# Patient Record
Sex: Female | Born: 1940
Health system: Western US, Academic
[De-identification: ages and names within clinical notes are randomized; demographics above are authoritative.]

## PROBLEM LIST (undated history)

## (undated) DIAGNOSIS — C801 Malignant (primary) neoplasm, unspecified: Secondary | ICD-10-CM

## (undated) DIAGNOSIS — K589 Irritable bowel syndrome without diarrhea: Secondary | ICD-10-CM

## (undated) DIAGNOSIS — I10 Essential (primary) hypertension: Secondary | ICD-10-CM

## (undated) DIAGNOSIS — G43909 Migraine, unspecified, not intractable, without status migrainosus: Secondary | ICD-10-CM

## (undated) HISTORY — PX: TONSILLECTOMY: SUR1361

## (undated) HISTORY — PX: OTHER SURGICAL HISTORY: SHX169

---

## 2007-12-05 ENCOUNTER — Emergency Department: Payer: Self-pay | Admitting: Emergency Medicine

## 2015-07-16 ENCOUNTER — Emergency Department
Admission: EM | Admit: 2015-07-16 | Discharge: 2015-07-16 | Discharge status: Against Medical Advice | Payer: Medicare Other | Attending: Emergency Medicine | Admitting: Emergency Medicine

## 2015-07-16 ENCOUNTER — Encounter: Payer: Self-pay | Admitting: *Deleted

## 2015-07-16 DIAGNOSIS — R51 Headache: Secondary | ICD-10-CM | POA: Diagnosis not present

## 2015-07-16 DIAGNOSIS — R110 Nausea: Secondary | ICD-10-CM | POA: Diagnosis not present

## 2015-07-16 DIAGNOSIS — I10 Essential (primary) hypertension: Secondary | ICD-10-CM | POA: Diagnosis present

## 2015-07-16 HISTORY — DX: Irritable bowel syndrome, unspecified: K58.9

## 2015-07-16 HISTORY — DX: Essential (primary) hypertension: I10

## 2015-07-16 NOTE — ED Notes (Signed)
Took ativan 1 mg prior to arrival.

## 2015-07-16 NOTE — ED Notes (Signed)
Pt to stat registration; says she's feeling much better and just wants to go home; slight headache, rates 1.5/10; denies nausea;

## 2015-07-16 NOTE — ED Notes (Signed)
Pt reports hypertension throughout the afternoon, as high as 188/125 at 2020. On arrival to ED 149/70. Pt reports headache, nausea, tingling, hx of anxiety.

## 2015-09-15 ENCOUNTER — Emergency Department: Payer: Medicare Other

## 2015-09-15 ENCOUNTER — Emergency Department
Admission: EM | Admit: 2015-09-15 | Discharge: 2015-09-15 | Disposition: A | Discharge status: Home / Self Care | Payer: Medicare Other | Attending: Emergency Medicine | Admitting: Emergency Medicine

## 2015-09-15 DIAGNOSIS — R1031 Right lower quadrant pain: Secondary | ICD-10-CM | POA: Insufficient documentation

## 2015-09-15 DIAGNOSIS — I10 Essential (primary) hypertension: Secondary | ICD-10-CM | POA: Insufficient documentation

## 2015-09-15 DIAGNOSIS — Z87891 Personal history of nicotine dependence: Secondary | ICD-10-CM | POA: Diagnosis not present

## 2015-09-15 DIAGNOSIS — R1011 Right upper quadrant pain: Secondary | ICD-10-CM | POA: Diagnosis not present

## 2015-09-15 DIAGNOSIS — R1084 Generalized abdominal pain: Secondary | ICD-10-CM | POA: Diagnosis present

## 2015-09-15 DIAGNOSIS — R112 Nausea with vomiting, unspecified: Secondary | ICD-10-CM | POA: Insufficient documentation

## 2015-09-15 DIAGNOSIS — R109 Unspecified abdominal pain: Secondary | ICD-10-CM

## 2015-09-15 HISTORY — DX: Malignant (primary) neoplasm, unspecified: C80.1

## 2015-09-15 HISTORY — DX: Migraine, unspecified, not intractable, without status migrainosus: G43.909

## 2015-09-15 LAB — CBC
HCT: 43.7 % (ref 35.0–47.0)
Hemoglobin: 14.4 g/dL (ref 12.0–16.0)
MCH: 30.5 pg (ref 26.0–34.0)
MCHC: 33 g/dL (ref 32.0–36.0)
MCV: 92.2 fL (ref 80.0–100.0)
PLATELETS: 187 10*3/uL (ref 150–440)
RBC: 4.74 MIL/uL (ref 3.80–5.20)
RDW: 14.5 % (ref 11.5–14.5)
WBC: 12.9 10*3/uL — ABNORMAL HIGH (ref 3.6–11.0)

## 2015-09-15 LAB — URINALYSIS COMPLETE WITH MICROSCOPIC (ARMC ONLY)
Bilirubin Urine: NEGATIVE
GLUCOSE, UA: NEGATIVE mg/dL
HGB URINE DIPSTICK: NEGATIVE
Ketones, ur: NEGATIVE mg/dL
Leukocytes, UA: NEGATIVE
NITRITE: NEGATIVE
PH: 8 (ref 5.0–8.0)
Protein, ur: NEGATIVE mg/dL
Specific Gravity, Urine: 1.027 (ref 1.005–1.030)

## 2015-09-15 LAB — BASIC METABOLIC PANEL
Anion gap: 10 (ref 5–15)
BUN: 20 mg/dL (ref 6–20)
CHLORIDE: 102 mmol/L (ref 101–111)
CO2: 28 mmol/L (ref 22–32)
CREATININE: 1.02 mg/dL — AB (ref 0.44–1.00)
Calcium: 9.7 mg/dL (ref 8.9–10.3)
GFR calc non Af Amer: 52 mL/min — ABNORMAL LOW (ref >60)
GLUCOSE: 144 mg/dL — AB (ref 65–99)
Potassium: 4.6 mmol/L (ref 3.5–5.1)
Sodium: 140 mmol/L (ref 135–145)

## 2015-09-15 LAB — LIPASE, BLOOD: Lipase: 19 U/L (ref 11–51)

## 2015-09-15 LAB — TROPONIN I: Troponin I: <0.03 ng/mL (ref <0.031)

## 2015-09-15 MED ORDER — IOHEXOL 350 MG/ML SOLN
100.0000 mL | Freq: Once | INTRAVENOUS | Status: AC | PRN
  Administered 2015-09-15: 85 mL via INTRAVENOUS

## 2015-09-15 MED ORDER — SODIUM CHLORIDE 0.9 % IV BOLUS (SEPSIS)
1000.0000 mL | Freq: Once | INTRAVENOUS | Status: AC
  Administered 2015-09-15: 1000 mL via INTRAVENOUS

## 2015-09-15 MED ORDER — ONDANSETRON 4 MG PO TBDP: 4.0000 mg | ORAL_TABLET | Freq: Three times a day (TID) | ORAL | Status: AC | PRN

## 2015-09-15 MED ORDER — IOHEXOL 240 MG/ML SOLN
25.0000 mL | Freq: Once | INTRAMUSCULAR | Status: AC | PRN
  Administered 2015-09-15: 25 mL via ORAL

## 2015-09-15 MED ORDER — MORPHINE SULFATE (PF) 2 MG/ML IV SOLN
2.0000 mg | Freq: Once | INTRAVENOUS | Status: AC
  Administered 2015-09-15: 2 mg via INTRAVENOUS
  Filled 2015-09-15: qty 1

## 2015-09-15 MED ORDER — ONDANSETRON HCL 4 MG/2ML IJ SOLN
4.0000 mg | Freq: Once | INTRAMUSCULAR | Status: AC
  Administered 2015-09-15: 4 mg via INTRAVENOUS
  Filled 2015-09-15: qty 2

## 2015-09-15 NOTE — ED Provider Notes (Signed)
[facility] Emergency Department Provider Note  ____________________________________________  Time seen: 4:40 AM  I have reviewed the triage vital signs and the nursing notes.   HISTORY  Chief Complaint Abdominal Pain and Nausea     HPI Savannah Strong is a 74 y.o. female presents via EMS with 7 out of 10 generalized abdominal pain and nausea with accompanying high blood pressure. Patient denies any fever, no diarrhea or vomiting     Past Medical History  Diagnosis Date  . Hypertension   . IBS (irritable bowel syndrome)   . Migraines     There are no active problems to display for this patient.   Past Surgical History  Procedure Laterality Date  . Masectomy    . Tonsillectomy      No current outpatient prescriptions on file.  Allergies Sulfa antibiotics  No family history on file.  Social History Social History  Substance Use Topics  . Smoking status: Former Research scientist (life sciences)  . Smokeless tobacco: None  . Alcohol Use: Yes    Review of Systems  Constitutional: Negative for fever. Eyes: Negative for visual changes. ENT: Negative for sore throat. Cardiovascular: Negative for chest pain. Respiratory: Negative for shortness of breath. Gastrointestinal: Positive for abdominal pain, vomiting. Genitourinary: Negative for dysuria. Musculoskeletal: Negative for back pain. Skin: Negative for rash. Neurological: Negative for headaches, focal weakness or numbness.  10-point ROS otherwise negative.  ____________________________________________   PHYSICAL EXAM:  VITAL SIGNS: ED Triage Vitals  Enc Vitals Group     BP 09/15/15 0437 177/86 mmHg     Pulse Rate 09/15/15 0437 67     Resp 09/15/15 0437 12     Temp 09/15/15 0437 97.6 F (36.4 C)     Temp Source 09/15/15 0437 Oral     SpO2 09/15/15 0437 99 %     Weight 09/15/15 0437 180 lb (81.647 kg)     Height 09/15/15 0437 5\' 8"  (1.727 m)     Head Cir --      Peak Flow --      Pain Score  09/15/15 0439 5     Pain Loc --      Pain Edu? --      Excl. in [place]? --    Constitutional: Alert and oriented. Well appearing and in no distress. Eyes: Conjunctivae are normal. PERRL. Normal extraocular movements. ENT   Head: Normocephalic and atraumatic.   Nose: No congestion/rhinnorhea.   Mouth/Throat: Mucous membranes are moist.   Neck: No stridor. Hematological/Lymphatic/Immunilogical: No cervical lymphadenopathy. Cardiovascular: Normal rate, regular rhythm. Normal and symmetric distal pulses are present in all extremities. No murmurs, rubs, or gallops. Respiratory: Normal respiratory effort without tachypnea nor retractions. Breath sounds are clear and equal bilaterally. No wheezes/rales/rhonchi. Gastrointestinal: RUQ/RLQ pain. No distention. There is no CVA tenderness. Genitourinary: deferred Musculoskeletal: Nontender with normal range of motion in all extremities. No joint effusions.  No lower extremity tenderness nor edema. Neurologic:  Normal speech and language. No gross focal neurologic deficits are appreciated. Speech is normal.  Skin:  Skin is warm, dry and intact. No rash noted. Psychiatric: Mood and affect are normal. Speech and behavior are normal. Patient exhibits appropriate insight and judgment.  ____________________________________________    LABS (pertinent positives/negatives)  Labs Reviewed  BASIC METABOLIC PANEL - Abnormal; Notable for the following:    Glucose, Bld 144 (*)    Creatinine, Ser 1.02 (*)    GFR calc non Af Amer 52 (*)    All other components within normal  limits  CBC - Abnormal; Notable for the following:    WBC 12.9 (*)    All other components within normal limits  TROPONIN I  LIPASE, BLOOD         RADIOLOGY      US Abdomen Limited RUQ (Final result) Result time: 09/15/15 06:27:44   Procedure changed from US Abdomen Limited      Final result by Rad Results In Interface (09/15/15 06:27:44)   Narrative:    CLINICAL DATA: Acute onset of right upper quadrant abdominal pain, nausea and vomiting. Initial encounter.  EXAM: US ABDOMEN LIMITED - RIGHT UPPER QUADRANT  COMPARISON: None.  FINDINGS: Gallbladder:  No gallstones or wall thickening visualized. No sonographic Murphy sign noted.  Common bile duct:  Diameter: 0.3 cm, within normal limits in caliber.  Liver:  A simple 2.2 x 1.8 x 2.0 cm cyst is noted near the porta hepatis. Trace ascites is seen about the dome of the liver. Within normal limits in parenchymal echogenicity.  IMPRESSION: 1. No acute abnormality within the right upper quadrant. 2. Small hepatic cyst noted. 3. Trace ascites about the dome of the liver.   Electronically Signed By: Garald Balding M.D. On: 09/15/2015 06:27       CT Abdomen Pelvis W Contrast (Final result) Result time: 09/15/15 08:23:54   Final result by Rad Results In Interface (09/15/15 08:23:54)   Narrative:   CLINICAL DATA: Abdominal pain, nausea, and vomiting.  EXAM: CT ABDOMEN AND PELVIS WITH CONTRAST  TECHNIQUE: Multidetector CT imaging of the abdomen and pelvis was performed using the standard protocol following bolus administration of intravenous contrast.  CONTRAST: 73mL OMNIPAQUE IOHEXOL 350 MG/ML SOLN  COMPARISON: Ultrasound exam dated 09/15/2015  FINDINGS: Lower chest: 8 mm calcified granuloma at the right lung base laterally. Otherwise, normal.  Hepatobiliary: Simple 2.5 cm cyst in the right lobe of the liver in adjacent to the gallbladder. Otherwise normal. Biliary tree is normal.  Pancreas: Normal.  Spleen: Multiple calcified granulomas in the spleen. Small accessory spleen. Otherwise normal.  Adrenals/Urinary Tract: Normal.  Stomach/Bowel: Scattered diverticula in the distal colon. Otherwise normal including the terminal ileum and appendix.  Vascular/Lymphatic: No adenopathy. Minimal calcifications in the aorta and iliac  arteries.  Reproductive: Normal.  Other: No mass lesions. Tiny amount of free fluid in the pelvis and right pericolic gutter. This is nonspecific.  Musculoskeletal: No acute abnormalities. Severe degenerative disc disease at L3-4.  IMPRESSION: Benign appearing abdomen.   Electronically Signed By: Lorriane Shire M.D. On: 09/15/2015 08:23       INITIAL IMPRESSION / ASSESSMENT AND PLAN / ED COURSE  Pertinent labs & imaging results that were available during my care of the patient were reviewed by me and considered in my medical decision making (see chart for details).  Patient's care transferred to Dr. Edd Fabian at 7:30 AM   ____________________________________________   FINAL CLINICAL IMPRESSION(S) / ED DIAGNOSES  Final diagnoses:  Abdominal pain, unspecified abdominal location      Gregor Hams, MD 09/16/15 [phone]

## 2015-09-15 NOTE — ED Notes (Signed)
Patient transported to CT 

## 2015-09-15 NOTE — ED Notes (Signed)
Pt presents to ED via EMS from [facility] c/o abdominal pain, nausea. Pt has high blood pressure. Pt states she normally gets a headache when her BP is so high, but states no headache this time.

## 2015-09-15 NOTE — ED Notes (Signed)
Pt transported to US via stretcher.  

## 2015-09-15 NOTE — ED Notes (Signed)
Pt states pain in upper abdomen feels like a "slight ache." States "it hurts to breath."

## 2015-09-15 NOTE — ED Notes (Signed)
Resumed care from [place]. Pt resting and awaiting CT scan.

## 2015-09-15 NOTE — ED Notes (Signed)
Pt is done drinking contrast.

## 2015-09-15 NOTE — ED Notes (Signed)
Pt ambulated to bathroom with steady gait. 

## 2015-09-15 NOTE — ED Provider Notes (Signed)
-----------------------------------------   8:56 AM on 09/15/2015 -----------------------------------------  Care was assumed from Dr. Owens Shark at 0700 pending results of CT scan which are benign. At this time, the patient is sitting up in bed, reports that she feels well, she is tolerating by mouth intake. She reports that her symptoms today feel similar to when she suffered from gastritis and this may be a cause of her complaints today. She is requesting discharge at this time. Urinalysis is sent and is pending but I discussed with the patient and her husband that I would call them if there were abnormal results that required intervention. We'll discharge with return precautions and close PCP follow-up. The patient and her husband are comfortable with the discharge plan.  Joanne Gavel, MD 09/15/15 [phone]

## 2017-05-08 ENCOUNTER — Other Ambulatory Visit: Payer: Self-pay | Admitting: Otolaryngology

## 2017-05-08 DIAGNOSIS — R221 Localized swelling, mass and lump, neck: Secondary | ICD-10-CM

## 2017-05-08 DIAGNOSIS — R2231 Localized swelling, mass and lump, right upper limb: Secondary | ICD-10-CM

## 2017-05-14 ENCOUNTER — Ambulatory Visit
Admission: RE | Admit: 2017-05-14 | Discharge: 2017-05-14 | Disposition: A | Discharge status: Home / Self Care | Payer: Medicare Other | Source: Ambulatory Visit | Attending: Otolaryngology | Admitting: Otolaryngology

## 2017-05-14 DIAGNOSIS — R221 Localized swelling, mass and lump, neck: Secondary | ICD-10-CM | POA: Insufficient documentation

## 2017-05-14 DIAGNOSIS — R2231 Localized swelling, mass and lump, right upper limb: Secondary | ICD-10-CM | POA: Insufficient documentation

## 2018-01-30 ENCOUNTER — Other Ambulatory Visit: Payer: Self-pay | Admitting: Otolaryngology

## 2018-01-30 DIAGNOSIS — R1310 Dysphagia, unspecified: Secondary | ICD-10-CM

## 2018-02-17 ENCOUNTER — Ambulatory Visit
Admission: RE | Admit: 2018-02-17 | Discharge: 2018-02-17 | Disposition: A | Discharge status: Home / Self Care | Payer: Medicare Other | Source: Ambulatory Visit | Attending: Otolaryngology | Admitting: Otolaryngology

## 2018-02-17 DIAGNOSIS — R1310 Dysphagia, unspecified: Secondary | ICD-10-CM | POA: Diagnosis present

## 2018-02-17 DIAGNOSIS — R1314 Dysphagia, pharyngoesophageal phase: Secondary | ICD-10-CM

## 2018-02-17 NOTE — Therapy (Addendum)
[place], [place], [address] Phone: [phone]   Fax:     Modified Barium Swallow  Patient Details  Name: Savannah Strong MRN: [id number] Date of Birth: 1940/11/05 No data recorded  Encounter Date: 02/17/2018  End of Session - 02/17/18 2134    Visit Number  1    Number of Visits  1    Date for SLP Re-Evaluation  02/17/18    SLP Start Time  6    SLP Stop Time   1400    SLP Time Calculation (min)  60 min    Activity Tolerance  Patient tolerated treatment well       Past Medical History:  Diagnosis Date  . Cancer ([place])    Left breast with mast  . Hypertension   . IBS (irritable bowel syndrome)   . Migraines     Past Surgical History:  Procedure Laterality Date  . masectomy    . TONSILLECTOMY      There were no vitals filed for this visit.       Subjective: Patient behavior: (alertness, ability to follow instructions, etc.): pt pleasant, conversive. Natural dentition.  Chief complaint: dysphagia; Report of globus feelings to ENT, phlegm "hanging" feeling. She has not been on a PPI per report.   Objective:  Radiological Procedure: A videoflouroscopic evaluation of oral-preparatory, reflex initiation, and pharyngeal phases of the swallow was performed; as well as a screening of the upper esophageal phase.  I. POSTURE: upright II. VIEW: lateral III. COMPENSATORY STRATEGIES: f/u, dry swallow as needed IV. BOLUSES ADMINISTERED:  Thin Liquid: 5 trials  Nectar-thick Liquid: 1 trial  Honey-thick Liquid: NT  Puree: 3 trials  Mechanical Soft: 2 trials V. RESULTS OF EVALUATION: A. ORAL PREPARATORY PHASE: (The lips, tongue, and velum are observed for strength and coordination)       **Overall Severity Rating: WFL. Timely bolus management for mastication and A-P transfer; adequate oral clearing post swallowing.  B. SWALLOW INITIATION/REFLEX: (The reflex is normal if "triggered" by the  time the bolus reached the base of the tongue)  **Overall Severity Rating: [facility]. Timing of the pharyngeal swallow was appropriate w/ all bolus consistencies. Adequate airway protection, closure during swallowing.  C. PHARYNGEAL PHASE: (Pharyngeal function is normal if the bolus shows rapid, smooth, and continuous transit through the pharynx and there is no pharyngeal residue after the swallow)  **Overall Severity Rating: [facility] No significant pharyngeal residue noted post bolus swallowing w/ any consistency - any slight amount was cleared w/ a f/u, dry swallow by pt (independently).  D. LARYNGEAL PENETRATION: (Material entering into the laryngeal inlet/vestibule but not aspirated): None E. ASPIRATION: None F. ESOPHAGEAL PHASE: (Screening of the upper esophagus): no overt dysmotility noted in the Cervical Esophagus viewable  ASSESSMENT: Pt appeared to present w/ adequate oropharyngeal phase swallow function w/ no obvious oropharyngeal phase dysphagia during this exam. During the Esophageal phase, no overt dysmotility noted in the Cervical Esophagus viewable - no impedence noted in the Cervical Esophagus. During the pharyngeal phase, no significant pharyngeal residue noted post bolus swallowing w/ any consistency - any slight amount was cleared w/ a f/u, dry swallow by pt (independently). Laryngeal excursion and pharyngeal pressure during the swallow appeared appropriate and adequate. Timing of the pharyngeal swallow was [facility] for pt's age and appropriate w/ all bolus consistencies. Adequate airway protection, closure during swallowing. During the oral phase, timely bolus management for mastication and A-P transfer; adequate oral clearing post swallowing.  Recommend f/u w/ GI for further Esophageal assessment d/t pt's description of s/s of Reflux. Education discussed w/ pt.   PLAN/RECOMMENDATIONS:  A. Diet: Regular; thin liquids(recommend no ice if cold is uncomfortable). Pills in Puree if needed for easier  swallowing, clearing of the Esophagus  B. Swallowing Precautions: general aspiration precautions; recommend REFLUX precautions  C. Recommended consultation to: GI for further Esophageal phase assessment d/t describing s/s of REFLUX; management/education  D. Therapy recommendations: None.  E. Results and recommendations were discussed w/ pt; video viewed and questions answered          Dysphagia, pharyngoesophageal phase  Dysphagia, unspecified type - Plan: DG OP Swallowing Func-Medicare/Speech Path, DG OP Swallowing Func-Medicare/Speech Path        Problem List There are no active problems to display for this patient.     Orinda Kenner, [place], CCC-SLP [place] 02/17/2018, 9:35 PM  [facility] [place], [place], [address] Phone: [phone]   Fax:     Name: ADLER ALTON MRN: [id number] Date of Birth: Apr 06, 1941

## 2018-02-18 ENCOUNTER — Ambulatory Visit: Payer: Medicare Other

## 2019-03-03 ENCOUNTER — Emergency Department: Payer: Medicare Other

## 2019-03-03 ENCOUNTER — Emergency Department
Admission: EM | Admit: 2019-03-03 | Discharge: 2019-03-03 | Disposition: A | Discharge status: Home / Self Care | Payer: Medicare Other | Attending: Emergency Medicine | Admitting: Emergency Medicine

## 2019-03-03 ENCOUNTER — Other Ambulatory Visit: Payer: Self-pay

## 2019-03-03 DIAGNOSIS — I10 Essential (primary) hypertension: Secondary | ICD-10-CM | POA: Diagnosis not present

## 2019-03-03 DIAGNOSIS — Y9301 Activity, walking, marching and hiking: Secondary | ICD-10-CM | POA: Insufficient documentation

## 2019-03-03 DIAGNOSIS — Y999 Unspecified external cause status: Secondary | ICD-10-CM | POA: Insufficient documentation

## 2019-03-03 DIAGNOSIS — S6991XA Unspecified injury of right wrist, hand and finger(s), initial encounter: Secondary | ICD-10-CM

## 2019-03-03 DIAGNOSIS — Z79899 Other long term (current) drug therapy: Secondary | ICD-10-CM | POA: Diagnosis not present

## 2019-03-03 DIAGNOSIS — S0181XA Laceration without foreign body of other part of head, initial encounter: Secondary | ICD-10-CM | POA: Insufficient documentation

## 2019-03-03 DIAGNOSIS — S60511A Abrasion of right hand, initial encounter: Secondary | ICD-10-CM | POA: Insufficient documentation

## 2019-03-03 DIAGNOSIS — Z87891 Personal history of nicotine dependence: Secondary | ICD-10-CM | POA: Diagnosis not present

## 2019-03-03 DIAGNOSIS — S0990XA Unspecified injury of head, initial encounter: Secondary | ICD-10-CM | POA: Diagnosis present

## 2019-03-03 DIAGNOSIS — Z23 Encounter for immunization: Secondary | ICD-10-CM | POA: Insufficient documentation

## 2019-03-03 DIAGNOSIS — W101XXA Fall (on)(from) sidewalk curb, initial encounter: Secondary | ICD-10-CM | POA: Diagnosis not present

## 2019-03-03 DIAGNOSIS — Y929 Unspecified place or not applicable: Secondary | ICD-10-CM | POA: Diagnosis not present

## 2019-03-03 DIAGNOSIS — W19XXXA Unspecified fall, initial encounter: Secondary | ICD-10-CM

## 2019-03-03 MED ORDER — CEPHALEXIN 500 MG PO CAPS: 500.0000 mg | ORAL_CAPSULE | Freq: Four times a day (QID) | ORAL | 0 refills | Status: AC

## 2019-03-03 MED ORDER — BACITRACIN-NEOMYCIN-POLYMYXIN 400-5-5000 EX OINT
TOPICAL_OINTMENT | Freq: Once | CUTANEOUS | Status: AC
  Administered 2019-03-03: 16:00:00 via TOPICAL
  Filled 2019-03-03: qty 1

## 2019-03-03 MED ORDER — TETANUS-DIPHTH-ACELL PERTUSSIS 5-2.5-18.5 LF-MCG/0.5 IM SUSP
0.5000 mL | Freq: Once | INTRAMUSCULAR | Status: AC
  Administered 2019-03-03: 0.5 mL via INTRAMUSCULAR
  Filled 2019-03-03: qty 0.5

## 2019-03-03 MED ORDER — LIDOCAINE HCL (PF) 1 % IJ SOLN
5.0000 mL | Freq: Once | INTRAMUSCULAR | Status: AC
  Administered 2019-03-03: 5 mL via INTRADERMAL
  Filled 2019-03-03: qty 5

## 2019-03-03 NOTE — ED Triage Notes (Signed)
Pt states she was walking in the neighborhood today and tripped and fell on the sideway. Pt has a laceration to the right bow and abrasions to BL hands. Denies LOC. Pt denies any other injures.

## 2019-03-03 NOTE — ED Provider Notes (Signed)
[facility] Emergency Department Provider Note  ____________________________________________  Time seen: Approximately 1:15 PM  I have reviewed the triage vital signs and the nursing notes.   HISTORY  Chief Complaint Fall    HPI Savannah Strong is a 78 y.o. female that presents to the emergency department for evaluation after fall.  Patient was walking with a friend when she tripped on a curb and fell.  She hit her head on the sidewalk.  She reached out both hands to brace her fall and has abrasions to bilateral palms.  She has some bruising starting on her right hand.  No loss of consciousness.  She does not take any blood thinners.  She has been walking since fall.  She is unsure of last tetanus shot.   Past Medical History:  Diagnosis Date  . Cancer ([place])    Left breast with mast  . Hypertension   . IBS (irritable bowel syndrome)   . Migraines     There are no active problems to display for this patient.   Past Surgical History:  Procedure Laterality Date  . masectomy    . TONSILLECTOMY      Prior to Admission medications   Medication Sig Start Date End Date Taking? Authorizing Provider  butalbital-acetaminophen-caffeine (FIORICET) 50-325-40 MG tablet Take 1 tablet by mouth 2 (two) times daily as needed for headache.   Yes [provider]  escitalopram (LEXAPRO) 10 MG tablet Take 10 mg by mouth daily.   Yes [provider]  fluticasone furoate-vilanterol (BREO ELLIPTA) 200-25 MCG/INH AEPB Inhale 1 puff into the lungs daily.   Yes [provider]  loratadine (CLARITIN) 10 MG tablet Take 10 mg by mouth daily.   Yes [provider]  cephALEXin (KEFLEX) 500 MG capsule Take 1 capsule (500 mg total) by mouth 4 (four) times daily for 10 days. 03/03/19 03/13/19  Laban Emperor, PA-C  ondansetron (ZOFRAN ODT) 4 MG disintegrating tablet Take 1 tablet (4 mg total) by mouth every 8 (eight) hours as needed for nausea or  vomiting. 09/15/15   Joanne Gavel, MD    Allergies Sulfa antibiotics  No family history on file.  Social History Social History   Tobacco Use  . Smoking status: Former Research scientist (life sciences)  . Smokeless tobacco: Never Used  Substance Use Topics  . Alcohol use: Yes  . Drug use: No     Review of Systems  Cardiovascular: No chest pain. Respiratory: No SOB. Gastrointestinal: No abdominal pain.  No nausea, no vomiting.  Musculoskeletal: Positive for hand pain. Skin: Negative for rash, abrasions, lacerations.  Positive for ecchymosis. Neurological: Negative for headaches   ____________________________________________   PHYSICAL EXAM:  VITAL SIGNS: ED Triage Vitals [03/03/19 1247]  Enc Vitals Group     BP (!) 151/76     Pulse Rate (!) 58     Resp 17     Temp 98.2 F (36.8 C)     Temp Source Oral     SpO2 98 %     Weight 184 lb (83.5 kg)     Height 5\' 8"  (1.727 m)     Head Circumference      Peak Flow      Pain Score 1     Pain Loc      Pain Edu?      Excl. in [place]?      Constitutional: Alert and oriented. Well appearing and in no acute distress. Eyes: Conjunctivae are normal. PERRL. EOMI. Head: 2 cm  U shapped laceration with jagged edges to right temple. ENT:      Ears:      Nose: No congestion/rhinnorhea.      Mouth/Throat: Mucous membranes are moist.  Neck: No stridor.  No cervical spine tenderness to palpation. Cardiovascular: Normal rate, regular rhythm.  Good peripheral circulation. Respiratory: Normal respiratory effort without tachypnea or retractions. Lungs CTAB. Good air entry to the bases with no decreased or absent breath sounds. Musculoskeletal: Full range of motion to all extremities. No gross deformities appreciated.  Weightbearing.  Normal gait. Neurologic:  Normal speech and language. No gross focal neurologic deficits are appreciated.  Skin:  Skin is warm, dry.  Abrasions to bilateral palms.  Ecchymosis starting to right palm and right lateral dorsal  hand. Psychiatric: Mood and affect are normal. Speech and behavior are normal. Patient exhibits appropriate insight and judgement.   ____________________________________________   LABS (all labs ordered are listed, but only abnormal results are displayed)  Labs Reviewed - No data to display ____________________________________________  EKG   ____________________________________________  RADIOLOGY  Ct Head Wo Contrast  Result Date: 03/03/2019 CLINICAL DATA:  Tripped and fell on the sidewalk. EXAM: CT HEAD WITHOUT CONTRAST CT MAXILLOFACIAL WITHOUT CONTRAST CT CERVICAL SPINE WITHOUT CONTRAST TECHNIQUE: Multidetector CT imaging of the head, cervical spine, and maxillofacial structures were performed using the standard protocol without intravenous contrast. Multiplanar CT image reconstructions of the cervical spine and maxillofacial structures were also generated. COMPARISON:  CT head dated December 05, 2007. FINDINGS: CT HEAD FINDINGS Brain: No evidence of acute infarction, hemorrhage, hydrocephalus, extra-axial collection or mass lesion/mass effect. Mildly progressive generalized cerebral atrophy. Vascular: No hyperdense vessel or unexpected calcification. Skull: Normal. Negative for fracture or focal lesion. Other: Tiny superficial laceration above the right orbit. CT MAXILLOFACIAL FINDINGS Osseous: No fracture or mandibular dislocation. Slight lateral subluxation of the right mandibular condyle with respect to the temporal fossa. No destructive process. Orbits: Negative. No traumatic or inflammatory finding. Sinuses: Clear. Soft tissues: Negative. CT CERVICAL SPINE FINDINGS Alignment: Slight reversal of the normal cervical lordosis. No traumatic malalignment. Trace facet mediated anterolisthesis at C3-C4. Skull base and vertebrae: No acute fracture. No primary bone lesion or focal pathologic process. Soft tissues and spinal canal: No prevertebral fluid or swelling. No visible canal hematoma. Disc  levels: Moderate disc height loss and uncovertebral hypertrophy from C4-C5 through C6-C7. Severe facet arthropathy on the left at C2-C3 and C3-C4 with ankylosis of the left C2-C3 facet joint. Moderate right-sided facet arthropathy at C3-C4. Upper chest: Biapical pleuroparenchymal scarring, worse on the right. Other: None. IMPRESSION: 1.  No acute intracranial abnormality. 2. No acute maxillofacial fracture. Tiny superficial laceration above the right orbit. 3.  No acute cervical spine fracture. Electronically Signed   By: Titus Dubin M.D.   On: 03/03/2019 13:48   Ct Cervical Spine Wo Contrast  Result Date: 03/03/2019 CLINICAL DATA:  Tripped and fell on the sidewalk. EXAM: CT HEAD WITHOUT CONTRAST CT MAXILLOFACIAL WITHOUT CONTRAST CT CERVICAL SPINE WITHOUT CONTRAST TECHNIQUE: Multidetector CT imaging of the head, cervical spine, and maxillofacial structures were performed using the standard protocol without intravenous contrast. Multiplanar CT image reconstructions of the cervical spine and maxillofacial structures were also generated. COMPARISON:  CT head dated December 05, 2007. FINDINGS: CT HEAD FINDINGS Brain: No evidence of acute infarction, hemorrhage, hydrocephalus, extra-axial collection or mass lesion/mass effect. Mildly progressive generalized cerebral atrophy. Vascular: No hyperdense vessel or unexpected calcification. Skull: Normal. Negative for fracture or focal lesion. Other: Tiny superficial laceration above the  right orbit. CT MAXILLOFACIAL FINDINGS Osseous: No fracture or mandibular dislocation. Slight lateral subluxation of the right mandibular condyle with respect to the temporal fossa. No destructive process. Orbits: Negative. No traumatic or inflammatory finding. Sinuses: Clear. Soft tissues: Negative. CT CERVICAL SPINE FINDINGS Alignment: Slight reversal of the normal cervical lordosis. No traumatic malalignment. Trace facet mediated anterolisthesis at C3-C4. Skull base and vertebrae: No  acute fracture. No primary bone lesion or focal pathologic process. Soft tissues and spinal canal: No prevertebral fluid or swelling. No visible canal hematoma. Disc levels: Moderate disc height loss and uncovertebral hypertrophy from C4-C5 through C6-C7. Severe facet arthropathy on the left at C2-C3 and C3-C4 with ankylosis of the left C2-C3 facet joint. Moderate right-sided facet arthropathy at C3-C4. Upper chest: Biapical pleuroparenchymal scarring, worse on the right. Other: None. IMPRESSION: 1.  No acute intracranial abnormality. 2. No acute maxillofacial fracture. Tiny superficial laceration above the right orbit. 3.  No acute cervical spine fracture. Electronically Signed   By: Titus Dubin M.D.   On: 03/03/2019 13:48   Dg Hand Complete Right  Result Date: 03/03/2019 CLINICAL DATA:  Pain following fall EXAM: RIGHT HAND - COMPLETE 3+ VIEW COMPARISON:  None. FINDINGS: Frontal, oblique, and lateral views obtained. There is no appreciable fracture or dislocation. There is slight narrowing of the scaphotrapezial joint. Other joint spaces appear unremarkable. No erosive change. IMPRESSION: No acute fracture or dislocation. Mild narrowing scaphotrapezial joint. Electronically Signed   By: Lowella Grip III M.D.   On: 03/03/2019 13:31   Ct Maxillofacial Wo Contrast  Result Date: 03/03/2019 CLINICAL DATA:  Tripped and fell on the sidewalk. EXAM: CT HEAD WITHOUT CONTRAST CT MAXILLOFACIAL WITHOUT CONTRAST CT CERVICAL SPINE WITHOUT CONTRAST TECHNIQUE: Multidetector CT imaging of the head, cervical spine, and maxillofacial structures were performed using the standard protocol without intravenous contrast. Multiplanar CT image reconstructions of the cervical spine and maxillofacial structures were also generated. COMPARISON:  CT head dated December 05, 2007. FINDINGS: CT HEAD FINDINGS Brain: No evidence of acute infarction, hemorrhage, hydrocephalus, extra-axial collection or mass lesion/mass effect. Mildly  progressive generalized cerebral atrophy. Vascular: No hyperdense vessel or unexpected calcification. Skull: Normal. Negative for fracture or focal lesion. Other: Tiny superficial laceration above the right orbit. CT MAXILLOFACIAL FINDINGS Osseous: No fracture or mandibular dislocation. Slight lateral subluxation of the right mandibular condyle with respect to the temporal fossa. No destructive process. Orbits: Negative. No traumatic or inflammatory finding. Sinuses: Clear. Soft tissues: Negative. CT CERVICAL SPINE FINDINGS Alignment: Slight reversal of the normal cervical lordosis. No traumatic malalignment. Trace facet mediated anterolisthesis at C3-C4. Skull base and vertebrae: No acute fracture. No primary bone lesion or focal pathologic process. Soft tissues and spinal canal: No prevertebral fluid or swelling. No visible canal hematoma. Disc levels: Moderate disc height loss and uncovertebral hypertrophy from C4-C5 through C6-C7. Severe facet arthropathy on the left at C2-C3 and C3-C4 with ankylosis of the left C2-C3 facet joint. Moderate right-sided facet arthropathy at C3-C4. Upper chest: Biapical pleuroparenchymal scarring, worse on the right. Other: None. IMPRESSION: 1.  No acute intracranial abnormality. 2. No acute maxillofacial fracture. Tiny superficial laceration above the right orbit. 3.  No acute cervical spine fracture. Electronically Signed   By: Titus Dubin M.D.   On: 03/03/2019 13:48    ____________________________________________    PROCEDURES  Procedure(s) performed:    Procedures  LACERATION REPAIR Performed by: Laban Emperor  Consent: Verbal consent obtained.  Consent given by: patient  Prepped and Draped in normal sterile fashion  Wound explored: No foreign bodies   Laceration Location: temple  Laceration Length: 2 cm  Anesthesia: None  Local anesthetic: lidocaine 1% without epinephrine  Anesthetic total: 5 ml  Irrigation method: syringe  Amount of  cleaning: 580ml normal saline  Skin closure: 5-0 nylon  Number of sutures: 6  Technique: Simple interrupted  Patient tolerance: Patient tolerated the procedure well with no immediate complications.  Medications  neomycin-bacitracin-polymyxin (NEOSPORIN) ointment packet (has no administration in time range)  Tdap (BOOSTRIX) injection 0.5 mL (0.5 mLs Intramuscular Given 03/03/19 1346)  lidocaine (PF) (XYLOCAINE) 1 % injection 5 mL (5 mLs Intradermal Given 03/03/19 1346)     ____________________________________________   INITIAL IMPRESSION / ASSESSMENT AND PLAN / ED COURSE  Pertinent labs & imaging results that were available during my care of the patient were reviewed by me and considered in my medical decision making (see chart for details).  Review of the [place] CSRS was performed in accordance of the [place] prior to dispensing any controlled drugs.   Patient presents emergency department for evaluation after fall.  Vital signs and exam are reassuring.  Head CT, cervical CT, facial CT negative for acute abnormalities other than small temple laceration.  Laceration was repaired with stitches.  Hand x-ray shows some mild narrowing of the scaphotrapezial joint.  Wrist splint was placed.  Patient will follow-up with orthopedics for recheck.  Patient will be discharged home with prescriptions for Keflex.  Tetanus shot was updated.  Patient is to follow up with primary care and orthopedics as directed. Patient is given ED precautions to return to the ED for any worsening or new symptoms.     ____________________________________________  FINAL CLINICAL IMPRESSION(S) / ED DIAGNOSES  Final diagnoses:  Fall, initial encounter  Facial laceration, initial encounter  Abrasion of right hand, initial encounter  Injury of right wrist, initial encounter      NEW MEDICATIONS STARTED DURING THIS VISIT:  ED Discharge Orders         Ordered    cephALEXin (KEFLEX) 500 MG capsule  4 times daily      03/03/19 1530              This chart was dictated using voice recognition software/Dragon. Despite best efforts to proofread, errors can occur which can change the meaning. Any change was purely unintentional.    Laban Emperor, PA-C 03/03/19 1601    Harvest Dark, MD 03/04/19 [phone]

## 2019-03-03 NOTE — ED Notes (Signed)
See triage note  Presents s/p fall  States she was walking with a friend and tripped on some uneven sidewalk  hitting her head   No LOC  Laceration noted to right temporal area   Abrasion noted to right hand with some bruising

## 2019-03-03 NOTE — Discharge Instructions (Addendum)
Your head CT, facial CT, neck CT are negative for any fracture or bleeding.  Your wrist x-ray does not show a fracture but has a questionable spot so we splinted your wrist and request that you follow-up with orthopedics for re-x-ray.  Wear splint until you see orthopedics.  Ice and elevate wrist tonight.  You can also ice where you hit your face.  Stitches need to come out in 5 days.  Please keep stitches dry for 24 hours.  You can apply Neosporin 2 stitches twice a day.  Keep them covered.  Take Keflex to prevent infection.

## 2020-02-24 ENCOUNTER — Inpatient Hospital Stay
Admission: EM | Admit: 2020-02-24 | Discharge: 2020-02-26 | DRG: 537 | Disposition: A | Discharge status: Home Health | Payer: Medicare Other | Attending: Internal Medicine | Admitting: Internal Medicine

## 2020-02-24 ENCOUNTER — Emergency Department: Payer: Medicare Other

## 2020-02-24 ENCOUNTER — Other Ambulatory Visit: Payer: Self-pay

## 2020-02-24 ENCOUNTER — Encounter: Payer: Self-pay | Admitting: Emergency Medicine

## 2020-02-24 DIAGNOSIS — W1781XA Fall down embankment (hill), initial encounter: Secondary | ICD-10-CM

## 2020-02-24 DIAGNOSIS — Y9289 Other specified places as the place of occurrence of the external cause: Secondary | ICD-10-CM

## 2020-02-24 DIAGNOSIS — S76112A Strain of left quadriceps muscle, fascia and tendon, initial encounter: Secondary | ICD-10-CM | POA: Diagnosis not present

## 2020-02-24 DIAGNOSIS — Z79899 Other long term (current) drug therapy: Secondary | ICD-10-CM

## 2020-02-24 DIAGNOSIS — W19XXXA Unspecified fall, initial encounter: Principal | ICD-10-CM

## 2020-02-24 DIAGNOSIS — Z20822 Contact with and (suspected) exposure to covid-19: Secondary | ICD-10-CM | POA: Diagnosis present

## 2020-02-24 DIAGNOSIS — S60512A Abrasion of left hand, initial encounter: Secondary | ICD-10-CM | POA: Diagnosis present

## 2020-02-24 DIAGNOSIS — I609 Nontraumatic subarachnoid hemorrhage, unspecified: Secondary | ICD-10-CM

## 2020-02-24 DIAGNOSIS — S40212A Abrasion of left shoulder, initial encounter: Secondary | ICD-10-CM | POA: Diagnosis present

## 2020-02-24 DIAGNOSIS — Z9089 Acquired absence of other organs: Secondary | ICD-10-CM

## 2020-02-24 DIAGNOSIS — Z87891 Personal history of nicotine dependence: Secondary | ICD-10-CM

## 2020-02-24 DIAGNOSIS — S60511A Abrasion of right hand, initial encounter: Secondary | ICD-10-CM | POA: Diagnosis present

## 2020-02-24 DIAGNOSIS — S0990XA Unspecified injury of head, initial encounter: Secondary | ICD-10-CM

## 2020-02-24 DIAGNOSIS — Z9012 Acquired absence of left breast and nipple: Secondary | ICD-10-CM

## 2020-02-24 DIAGNOSIS — Y9301 Activity, walking, marching and hiking: Secondary | ICD-10-CM | POA: Diagnosis present

## 2020-02-24 DIAGNOSIS — S066X9A Traumatic subarachnoid hemorrhage with loss of consciousness of unspecified duration, initial encounter: Secondary | ICD-10-CM | POA: Diagnosis present

## 2020-02-24 DIAGNOSIS — Z882 Allergy status to sulfonamides status: Secondary | ICD-10-CM

## 2020-02-24 DIAGNOSIS — M25552 Pain in left hip: Secondary | ICD-10-CM

## 2020-02-24 DIAGNOSIS — S76012A Strain of muscle, fascia and tendon of left hip, initial encounter: Secondary | ICD-10-CM | POA: Diagnosis present

## 2020-02-24 DIAGNOSIS — R410 Disorientation, unspecified: Secondary | ICD-10-CM | POA: Diagnosis not present

## 2020-02-24 DIAGNOSIS — I10 Essential (primary) hypertension: Secondary | ICD-10-CM | POA: Diagnosis present

## 2020-02-24 DIAGNOSIS — Z853 Personal history of malignant neoplasm of breast: Secondary | ICD-10-CM

## 2020-02-24 DIAGNOSIS — G43909 Migraine, unspecified, not intractable, without status migrainosus: Secondary | ICD-10-CM | POA: Diagnosis present

## 2020-02-24 DIAGNOSIS — K589 Irritable bowel syndrome without diarrhea: Secondary | ICD-10-CM | POA: Diagnosis present

## 2020-02-24 LAB — CBC
HCT: 38.9 % (ref 36.0–46.0)
Hemoglobin: 13.1 g/dL (ref 12.0–15.0)
MCH: 31.3 pg (ref 26.0–34.0)
MCHC: 33.7 g/dL (ref 30.0–36.0)
MCV: 93.1 fL (ref 80.0–100.0)
Platelets: 204 10*3/uL (ref 150–400)
RBC: 4.18 MIL/uL (ref 3.87–5.11)
RDW: 13.7 % (ref 11.5–15.5)
WBC: 9.9 10*3/uL (ref 4.0–10.5)
nRBC: 0 % (ref 0.0–0.2)

## 2020-02-24 LAB — APTT: aPTT: 28 seconds (ref 24–36)

## 2020-02-24 LAB — PROTIME-INR
INR: 0.9 (ref 0.8–1.2)
Prothrombin Time: 11.8 seconds (ref 11.4–15.2)

## 2020-02-24 LAB — BASIC METABOLIC PANEL
Anion gap: 7 (ref 5–15)
BUN: 24 mg/dL — ABNORMAL HIGH (ref 8–23)
CO2: 26 mmol/L (ref 22–32)
Calcium: 9 mg/dL (ref 8.9–10.3)
Chloride: 101 mmol/L (ref 98–111)
Creatinine, Ser: 1.03 mg/dL — ABNORMAL HIGH (ref 0.44–1.00)
GFR calc Af Amer: 59 mL/min — ABNORMAL LOW (ref >60)
GFR calc non Af Amer: 51 mL/min — ABNORMAL LOW (ref >60)
Glucose, Bld: 109 mg/dL — ABNORMAL HIGH (ref 70–99)
Potassium: 3.8 mmol/L (ref 3.5–5.1)
Sodium: 134 mmol/L — ABNORMAL LOW (ref 135–145)

## 2020-02-24 LAB — RESPIRATORY PANEL BY RT PCR (FLU A&B, COVID)
Influenza A by PCR: NEGATIVE
Influenza B by PCR: NEGATIVE
SARS Coronavirus 2 by RT PCR: NEGATIVE

## 2020-02-24 MED ORDER — LEVETIRACETAM 250 MG PO TABS
250.0000 mg | ORAL_TABLET | Freq: Two times a day (BID) | ORAL | 0 refills | Status: AC
Start: 2020-02-24 — End: 2020-03-02

## 2020-02-24 MED ORDER — HYDROMORPHONE HCL 1 MG/ML IJ SOLN: 0.5000 mg | Freq: Once | INTRAMUSCULAR | Status: DC | PRN

## 2020-02-24 MED ORDER — ONDANSETRON HCL 4 MG/2ML IJ SOLN
4.0000 mg | Freq: Once | INTRAMUSCULAR | Status: AC
  Administered 2020-02-24: 4 mg via INTRAVENOUS
  Filled 2020-02-24: qty 2

## 2020-02-24 MED ORDER — LABETALOL HCL 5 MG/ML IV SOLN
10.0000 mg | Freq: Once | INTRAVENOUS | Status: AC
  Administered 2020-02-24: 10 mg via INTRAVENOUS
  Filled 2020-02-24: qty 4

## 2020-02-24 MED ORDER — FENTANYL CITRATE (PF) 100 MCG/2ML IJ SOLN
50.0000 ug | Freq: Once | INTRAMUSCULAR | Status: AC
  Administered 2020-02-24: 20:00:00 50 ug via INTRAVENOUS
  Filled 2020-02-24: qty 2

## 2020-02-24 NOTE — ED Notes (Signed)
Xray at the bedside.

## 2020-02-24 NOTE — ED Provider Notes (Signed)
[facility] Emergency Department Provider Note  ____________________________________________   First MD Initiated Contact with Patient 02/24/20 2008     (approximate)  I have reviewed the triage vital signs and the nursing notes.   HISTORY  Chief Complaint Fall    HPI Savannah Strong is a 79 y.o. female with prior breast cancer, hypertension who comes in with fall.  Patient lost her balance on a walking trail down a steep hill.  Patient is having new confusion per husband.  Patient landed on her face.  She got bruise of the left side of her face.  She endorses a little bit of left chest wall tenderness, tenderness and bruising on her bilateral hands, left shoulder.  She is able to lift up both legs up off the bed.  Patient does report a headache, moderate, constant, nothing makes it better, nothing makes it worse.  She denies any vision changes.  Patient denies having a headache prior to fall     Past Medical History:  Diagnosis Date  . Cancer ([place])    Left breast with mast  . Hypertension   . IBS (irritable bowel syndrome)   . Migraines     There are no problems to display for this patient.   Past Surgical History:  Procedure Laterality Date  . masectomy    . TONSILLECTOMY      Prior to Admission medications   Medication Sig Start Date End Date Taking? Authorizing Provider  butalbital-acetaminophen-caffeine (FIORICET) 50-325-40 MG tablet Take 1 tablet by mouth 2 (two) times daily as needed for headache.    [provider]  escitalopram (LEXAPRO) 10 MG tablet Take 10 mg by mouth daily.    [provider]  fluticasone furoate-vilanterol (BREO ELLIPTA) 200-25 MCG/INH AEPB Inhale 1 puff into the lungs daily.    [provider]  loratadine (CLARITIN) 10 MG tablet Take 10 mg by mouth daily.    [provider]  ondansetron (ZOFRAN ODT) 4 MG disintegrating tablet Take 1 tablet (4 mg total) by mouth every 8 (eight)  hours as needed for nausea or vomiting. 09/15/15   Joanne Gavel, MD    Allergies Sulfa antibiotics  History reviewed. No pertinent family history.  Social History Social History   Tobacco Use  . Smoking status: Former Research scientist (life sciences)  . Smokeless tobacco: Never Used  Substance Use Topics  . Alcohol use: Yes  . Drug use: No      Review of Systems Constitutional: No fever/chills Eyes: No visual changes. ENT: No sore throat. Cardiovascular: Denies chest pain. Respiratory: Denies shortness of breath. Gastrointestinal: No abdominal pain.  No nausea, no vomiting.  No diarrhea.  No constipation. Genitourinary: Negative for dysuria. Musculoskeletal: Negative for back pain.  Wrist pain left sided rib pain Skin: Negative for rash. Neurological: Positive headache, focal weakness or numbness. All other ROS negative ____________________________________________   PHYSICAL EXAM:  VITAL SIGNS: ED Triage Vitals  Enc Vitals Group     BP 02/24/20 1853 (!) 162/80     Pulse Rate 02/24/20 1853 83     Resp 02/24/20 1853 18     Temp 02/24/20 1853 98.1 F (36.7 C)     Temp Source 02/24/20 1853 Oral     SpO2 02/24/20 1853 100 %     Weight 02/24/20 1852 180 lb (81.6 kg)     Height 02/24/20 1852 5\' 8"  (1.727 m)     Head Circumference --      Peak Flow --  Pain Score --      Pain Loc --      Pain Edu? --      Excl. in [place]? --     Constitutional: Alert and oriented x3. Well appearing and in no acute distress. Eyes: Conjunctivae are normal. EOMI. Head: Atraumatic. Nose: No congestion/rhinnorhea. Mouth/Throat: Mucous membranes are moist.   Neck: No stridor. Trachea Midline. FROM Cardiovascular: Normal rate, regular rhythm. Grossly normal heart sounds.  Good peripheral circulation.  Slight tenderness on the left chest wall without any bruising Respiratory: Normal respiratory effort.  No retractions. Lungs CTAB. Gastrointestinal: Soft and nontender. No distention. No abdominal bruits.    Musculoskeletal: No lower extremity tenderness nor edema.  No joint effusions.  Patient able to lift both legs up off the ground.  Abrasion on the left shoulder but full range of motion of it.  Some abrasions on her bilateral hands Neurologic:  Normal speech and language. No gross focal neurologic deficits are appreciated.  3 nerves appear intact.  Able to lift both legs above the bed.  Moving all extremities. Skin:  Skin is warm, dry and intact. No rash noted. Psychiatric: Mood and affect are normal. Speech and behavior are normal. GU: Deferred   ____________________________________________   LABS (all labs ordered are listed, but only abnormal results are displayed)  Labs Reviewed  BASIC METABOLIC PANEL - Abnormal; Notable for the following components:      Result Value   Sodium 134 (*)    Glucose, Bld 109 (*)    BUN 24 (*)    Creatinine, Ser 1.03 (*)    GFR calc non Af Amer 51 (*)    GFR calc Af Amer 59 (*)    All other components within normal limits  RESPIRATORY PANEL BY RT PCR (FLU A&B, COVID)  CBC  PROTIME-INR  APTT   ____________________________________________   ED ECG REPORT I, Vanessa [place], the attending physician, personally viewed and interpreted this ECG.  EKG is normal sinus rate of 87, no ST elevation, no T wave inversions, normal intervals ____________________________________________  RADIOLOGY   Official radiology report(s): DG Chest 2 View  Result Date: 02/24/2020 CLINICAL DATA:  [place], rib pain EXAM: CHEST - 2 VIEW COMPARISON:  None. FINDINGS: Frontal and lateral views of the chest demonstrate an unremarkable cardiac silhouette. No airspace disease, effusion, or pneumothorax. Postsurgical changes from left mastectomy. On lateral view there is mild anterior wedging at the thoracolumbar junction, likely at the L1 vertebral body. Less than 10% loss of height anteriorly. This is consistent with age-indeterminate compression fracture. No other acute bony  abnormalities. IMPRESSION: 1. Age indeterminate mild anterior wedge compression deformity at approximately the L1 level. 2. Otherwise no acute intrathoracic process. Electronically Signed   By: Randa Ngo M.D.   On: 02/24/2020 19:58   DG Hip Unilat With Pelvis 2-3 Views Left  Result Date: 02/24/2020 CLINICAL DATA:  [place], confusion, bilateral hip pain EXAM: DG HIP (WITH OR WITHOUT PELVIS) 2-3V LEFT; DG HIP (WITH OR WITHOUT PELVIS) 2-3V RIGHT COMPARISON:  None. FINDINGS: Frontal view of the pelvis as well as frontal and frogleg lateral views of both hips are obtained. Right hip: No acute displaced fracture. Mild osteoarthritis. Alignment is anatomic. Remainder of the visualized bony pelvis is unremarkable. Soft tissues are normal. Left hip: No acute displaced fracture. Alignment is anatomic. Mild osteoarthritis. Soft tissues are unremarkable. IMPRESSION: 1. Mild bilateral hip osteoarthritis. 2. No acute hip fractures.  Unremarkable bony pelvis. Electronically Signed   By: Legrand Como  Ow[place] Shark M.D.   On: 02/24/2020 19:56   DG Hip Unilat  With Pelvis 2-3 Views Right  Result Date: 02/24/2020 CLINICAL DATA:  [place], confusion, bilateral hip pain EXAM: DG HIP (WITH OR WITHOUT PELVIS) 2-3V LEFT; DG HIP (WITH OR WITHOUT PELVIS) 2-3V RIGHT COMPARISON:  None. FINDINGS: Frontal view of the pelvis as well as frontal and frogleg lateral views of both hips are obtained. Right hip: No acute displaced fracture. Mild osteoarthritis. Alignment is anatomic. Remainder of the visualized bony pelvis is unremarkable. Soft tissues are normal. Left hip: No acute displaced fracture. Alignment is anatomic. Mild osteoarthritis. Soft tissues are unremarkable. IMPRESSION: 1. Mild bilateral hip osteoarthritis. 2. No acute hip fractures.  Unremarkable bony pelvis. Electronically Signed   By: Randa Ngo M.D.   On: 02/24/2020 19:56    ____________________________________________   PROCEDURES  Procedure(s) performed (including Critical  Care):  Procedures   ____________________________________________   INITIAL IMPRESSION / ASSESSMENT AND PLAN / ED COURSE  Savannah Strong was evaluated in Emergency Department on 02/24/2020 for the symptoms described in the history of present illness. She was evaluated in the context of the global COVID-19 pandemic, which necessitated consideration that the patient might be at risk for infection with the SARS-CoV-2 virus that causes COVID-19. Institutional protocols and algorithms that pertain to the evaluation of patients at risk for COVID-19 are in a state of rapid change based on information released by regulatory bodies including the CDC and federal and state organizations. These policies and algorithms were followed during the patient's care in the ED.    Patient is an 80 year old who comes in with fall and some confusion.  Will get CT head evaluate for intercranial hemorrhage, CT face evaluate for facial fracture, CT cervical evaluate for cervical fracture.  Patient able to lift both legs up off the bed and does have some scattered abrasions.  Will get x-rays to evaluate for any fractures.  She has some mild chest wall tenderness on her left but no abrasion.  Will get x-ray to evaluate for rib fracture.  No abdominal tenderness suggest intra-abdominal injury.   Kidney function is at baseline.  No white count elevation to suggest infection  CT head concerning for subarachnoid hemorrhage.  Discussed with Dr. Cari Caraway from neurosurgery who recommended repeat CT head and if stable can DC home We will give a dose of labetalol for her blood pressure  Patient was able to ambulate with the nurse although was reporting some pain on her left femur region but was able to fully lift up her leg and get into the bed.  Will get x-ray.  X-ray was negative.  Patient had off to oncoming team pending repeat CT head if stable patient can be discharged home on  Keppra. ____________________________________________   FINAL CLINICAL IMPRESSION(S) / ED DIAGNOSES   Final diagnoses:  Fall, initial encounter  Injury of head, initial encounter  Subarachnoid hemorrhage following injury, with loss of consciousness, initial encounter ([place])  Acute hip pain, left  Essential hypertension      MEDICATIONS GIVEN DURING THIS VISIT:  Medications  albuterol (PROVENTIL) (2.5 MG/3ML) 0.083% nebulizer solution 2.5 mg (has no administration in time range)  cholecalciferol (VITAMIN D3) tablet 1,000 Units (1,000 Units Oral Given 02/25/20 1037)  fluticasone (FLONASE) 50 MCG/ACT nasal spray 2 spray (2 sprays Each Nare Given 02/25/20 1037)  fluticasone furoate-vilanterol (BREO ELLIPTA) 100-25 MCG/INH 1 puff (1 puff Inhalation Given 02/25/20 1039)  loratadine (CLARITIN) tablet 10 mg (10 mg Oral Given 02/25/20 1037)  ondansetron (ZOFRAN-ODT) disintegrating tablet 4 mg (has no administration in time range)  levETIRAcetam (KEPPRA) tablet 250 mg (250 mg Oral Given 02/25/20 2144)  oxyCODONE-acetaminophen (PERCOCET/ROXICET) 5-325 MG per tablet 1 tablet (has no administration in time range)  methocarbamol (ROBAXIN) tablet 500 mg (has no administration in time range)  morphine 2 MG/ML injection 0.5 mg (has no administration in time range)  hydrALAZINE (APRESOLINE) injection 5 mg (has no administration in time range)  acetaminophen (TYLENOL) tablet 650 mg (650 mg Oral Given 02/25/20 1749)  venlafaxine XR (EFFEXOR-XR) 24 hr capsule 37.5 mg (37.5 mg Oral Given 02/25/20 1306)  loperamide (IMODIUM) capsule 2 mg (has no administration in time range)  ondansetron (ZOFRAN) injection 4 mg (4 mg Intravenous Given 02/24/20 2025)  fentaNYL (SUBLIMAZE) injection 50 mcg (50 mcg Intravenous Given 02/24/20 2026)  labetalol (NORMODYNE) injection 10 mg (10 mg Intravenous Given 02/24/20 2127)     ED Discharge Orders         Ordered    levETIRAcetam (KEPPRA) 250 MG tablet  2 times daily     02/24/20 2259            Note:  This document was prepared using Dragon voice recognition software and may include unintentional dictation errors.   Vanessa [place], MD 02/25/20 2350

## 2020-02-24 NOTE — ED Notes (Addendum)
Pt assisted with ambulation in exam room. Pt stood without difficulty but once she put weight on her left leg she c/o pain to her outer thigh. Unable to ambulate past the stretcher. Pt assisted back to stretcher but was able to lift both legs independently onto stretcher. Pt tolerated well.

## 2020-02-24 NOTE — ED Provider Notes (Addendum)
-----------------------------------------   11:01 PM on 02/24/2020 -----------------------------------------  Assuming care from Dr. Jari Pigg.  In short, Savannah Strong is a 79 y.o. female with a chief complaint of fall and head injury.  Refer to the original H&P for additional details.  The current plan of care is to observe and reassess including CT head at 2:10am.  Anticipate discharge if CT head essentially unchagned (small subarachnoid hemorrhage on original CT, but dispo recommended by NSG if neurologically intact).     ----------------------------------------- 3:08 AM on 02/25/2020 -----------------------------------------  No clinically significant changes on repeat head CT and the patient has had no neurological decline.  She is at her baseline and alert and oriented.  I discussed the results with her and her husband who is at bedside.  However, in the interim, they have become very concerned about her ability to get by at home.  Specifically she is currently reporting that she is unable to bear weight on her left hip.  Her x-rays were negative for fracture, but she could not bear weight on her hip when trying to ambulate to the commode.  Given her age and comorbidities as well as the concerns for inability to bear weight, I have ordered PT/OT and social work consults for the possibility rehab placement but I have also ordered an MRI of her hip given that it is the most sensitive advanced imaging modality to rule out occult fracture.  If for some reason an MRI ends up being contraindicated I will proceed with a CT scan of the hip but I think this would be best for the patient to make sure there is no evidence of occult fracture.  Pharmacy will be consulted for her regular medication reconciliation.  Patient and husband agree with the plan.   Hinda Kehr, MD 02/25/20 [id number]    Hinda Kehr, MD 02/25/20 [phone]

## 2020-02-24 NOTE — Consult Note (Signed)
Patient reviewed with Dr. Jari Pigg. 79 yo female suffered same level fall. Lost balance walking down a trail.  She is confused but Ox3 per Dr. Jari Pigg.  CT shows R>L F SAH consistent with traumatic SAH.  Small IVH but no HCP.  Would recommend repeat CT in 6 hours.  If stable, would be ok to discharge home if patient is stable for that and at baseline mental state.  If not back at baseline, would recommend admission to hospital for PTOT evaluation.  Recommend Keppra 250 mg BID for 1 week.  Meade Maw

## 2020-02-24 NOTE — ED Notes (Addendum)
Pt assisted back to stretcher via wheelchair. voided large amount. Tolerated well.

## 2020-02-24 NOTE — ED Notes (Signed)
Dr. Jari Pigg at the bedside for pt evaluation.

## 2020-02-24 NOTE — ED Triage Notes (Addendum)
Pt presents to ED c/o fall. Pt is confused, unable to give accurate history. Pt's husband states pt lost her balance on a walking trail down a steep hill. Abrasions noted to L knee, both palms, and L side of face with hematoma to L temple. C/o pain bil hips. Husband states is it not normal for pt to be confused.

## 2020-02-24 NOTE — ED Notes (Addendum)
Pt assisted to restroom via wheelchair. Transferred without difficulty. No distress noted. Husband at the bedside.

## 2020-02-25 ENCOUNTER — Encounter: Payer: Self-pay | Admitting: Internal Medicine

## 2020-02-25 ENCOUNTER — Emergency Department: Payer: Medicare Other

## 2020-02-25 ENCOUNTER — Other Ambulatory Visit: Payer: Self-pay

## 2020-02-25 DIAGNOSIS — Z9089 Acquired absence of other organs: Secondary | ICD-10-CM | POA: Diagnosis not present

## 2020-02-25 DIAGNOSIS — I609 Nontraumatic subarachnoid hemorrhage, unspecified: Secondary | ICD-10-CM

## 2020-02-25 DIAGNOSIS — S60512A Abrasion of left hand, initial encounter: Secondary | ICD-10-CM | POA: Diagnosis present

## 2020-02-25 DIAGNOSIS — K589 Irritable bowel syndrome without diarrhea: Secondary | ICD-10-CM | POA: Diagnosis present

## 2020-02-25 DIAGNOSIS — Z79899 Other long term (current) drug therapy: Secondary | ICD-10-CM | POA: Diagnosis not present

## 2020-02-25 DIAGNOSIS — G43909 Migraine, unspecified, not intractable, without status migrainosus: Secondary | ICD-10-CM | POA: Diagnosis present

## 2020-02-25 DIAGNOSIS — Z882 Allergy status to sulfonamides status: Secondary | ICD-10-CM | POA: Diagnosis not present

## 2020-02-25 DIAGNOSIS — S76112A Strain of left quadriceps muscle, fascia and tendon, initial encounter: Secondary | ICD-10-CM | POA: Diagnosis present

## 2020-02-25 DIAGNOSIS — Z20822 Contact with and (suspected) exposure to covid-19: Secondary | ICD-10-CM | POA: Diagnosis present

## 2020-02-25 DIAGNOSIS — Z9012 Acquired absence of left breast and nipple: Secondary | ICD-10-CM | POA: Diagnosis not present

## 2020-02-25 DIAGNOSIS — S60511A Abrasion of right hand, initial encounter: Secondary | ICD-10-CM | POA: Diagnosis present

## 2020-02-25 DIAGNOSIS — W19XXXA Unspecified fall, initial encounter: Secondary | ICD-10-CM | POA: Diagnosis present

## 2020-02-25 DIAGNOSIS — Y9301 Activity, walking, marching and hiking: Secondary | ICD-10-CM | POA: Diagnosis present

## 2020-02-25 DIAGNOSIS — Z853 Personal history of malignant neoplasm of breast: Secondary | ICD-10-CM | POA: Diagnosis not present

## 2020-02-25 DIAGNOSIS — R410 Disorientation, unspecified: Secondary | ICD-10-CM | POA: Diagnosis present

## 2020-02-25 DIAGNOSIS — W1781XA Fall down embankment (hill), initial encounter: Secondary | ICD-10-CM | POA: Diagnosis not present

## 2020-02-25 DIAGNOSIS — Y9289 Other specified places as the place of occurrence of the external cause: Secondary | ICD-10-CM | POA: Diagnosis not present

## 2020-02-25 DIAGNOSIS — M25552 Pain in left hip: Secondary | ICD-10-CM | POA: Diagnosis not present

## 2020-02-25 DIAGNOSIS — Z87891 Personal history of nicotine dependence: Secondary | ICD-10-CM | POA: Diagnosis not present

## 2020-02-25 DIAGNOSIS — I10 Essential (primary) hypertension: Secondary | ICD-10-CM | POA: Diagnosis present

## 2020-02-25 DIAGNOSIS — S40212A Abrasion of left shoulder, initial encounter: Secondary | ICD-10-CM | POA: Diagnosis present

## 2020-02-25 DIAGNOSIS — S76012A Strain of muscle, fascia and tendon of left hip, initial encounter: Secondary | ICD-10-CM | POA: Diagnosis present

## 2020-02-25 DIAGNOSIS — S066X9A Traumatic subarachnoid hemorrhage with loss of consciousness of unspecified duration, initial encounter: Secondary | ICD-10-CM | POA: Diagnosis present

## 2020-02-25 MED ORDER — ACETAMINOPHEN 325 MG PO TABS
650.0000 mg | ORAL_TABLET | Freq: Four times a day (QID) | ORAL | Status: DC | PRN
  Administered 2020-02-25: 650 mg via ORAL
  Filled 2020-02-25: qty 2

## 2020-02-25 MED ORDER — METHOCARBAMOL 500 MG PO TABS
500.0000 mg | ORAL_TABLET | Freq: Three times a day (TID) | ORAL | Status: DC | PRN
  Filled 2020-02-25: qty 1

## 2020-02-25 MED ORDER — FLUTICASONE PROPIONATE 50 MCG/ACT NA SUSP
2.0000 | Freq: Every day | NASAL | Status: DC
  Administered 2020-02-25 – 2020-02-26 (×2): 2 via NASAL
  Filled 2020-02-25: qty 16

## 2020-02-25 MED ORDER — VENLAFAXINE HCL ER 37.5 MG PO CP24
37.5000 mg | ORAL_CAPSULE | Freq: Every day | ORAL | Status: DC
  Administered 2020-02-25 – 2020-02-26 (×2): 37.5 mg via ORAL
  Filled 2020-02-25 (×2): qty 1

## 2020-02-25 MED ORDER — ACETAMINOPHEN 500 MG PO TABS
500.0000 mg | ORAL_TABLET | Freq: Three times a day (TID) | ORAL | Status: DC
  Administered 2020-02-25: 500 mg via ORAL
  Filled 2020-02-25: qty 1

## 2020-02-25 MED ORDER — MORPHINE SULFATE (PF) 2 MG/ML IV SOLN: 0.5000 mg | INTRAVENOUS | Status: DC | PRN

## 2020-02-25 MED ORDER — ALBUTEROL SULFATE (2.5 MG/3ML) 0.083% IN NEBU: 2.5000 mg | INHALATION_SOLUTION | RESPIRATORY_TRACT | Status: DC | PRN

## 2020-02-25 MED ORDER — ONDANSETRON 4 MG PO TBDP
4.0000 mg | ORAL_TABLET | Freq: Three times a day (TID) | ORAL | Status: DC | PRN
  Filled 2020-02-25: qty 1

## 2020-02-25 MED ORDER — LORATADINE 10 MG PO TABS
10.0000 mg | ORAL_TABLET | Freq: Every day | ORAL | Status: DC
  Administered 2020-02-25 – 2020-02-26 (×2): 10 mg via ORAL
  Filled 2020-02-25 (×2): qty 1

## 2020-02-25 MED ORDER — VITAMIN D 25 MCG (1000 UNIT) PO TABS
1000.0000 [IU] | ORAL_TABLET | Freq: Every day | ORAL | Status: DC
  Administered 2020-02-25 – 2020-02-26 (×2): 1000 [IU] via ORAL
  Filled 2020-02-25 (×2): qty 1

## 2020-02-25 MED ORDER — LEVETIRACETAM 250 MG PO TABS
250.0000 mg | ORAL_TABLET | Freq: Two times a day (BID) | ORAL | Status: DC
  Administered 2020-02-25 – 2020-02-26 (×3): 250 mg via ORAL
  Filled 2020-02-25 (×6): qty 1

## 2020-02-25 MED ORDER — OXYCODONE-ACETAMINOPHEN 5-325 MG PO TABS: 1.0000 | ORAL_TABLET | ORAL | Status: DC | PRN

## 2020-02-25 MED ORDER — LOPERAMIDE HCL 2 MG PO CAPS: 2.0000 mg | ORAL_CAPSULE | Freq: Every day | ORAL | Status: DC | PRN

## 2020-02-25 MED ORDER — HYDRALAZINE HCL 20 MG/ML IJ SOLN: 5.0000 mg | INTRAMUSCULAR | Status: DC | PRN

## 2020-02-25 MED ORDER — FLUTICASONE FUROATE-VILANTEROL 100-25 MCG/INH IN AEPB
1.0000 | INHALATION_SPRAY | Freq: Every day | RESPIRATORY_TRACT | Status: DC
  Administered 2020-02-25 – 2020-02-26 (×2): 1 via RESPIRATORY_TRACT
  Filled 2020-02-25: qty 28

## 2020-02-25 NOTE — ED Notes (Signed)
Pt transferred to hospital bed and lights turned off for comfort

## 2020-02-25 NOTE — ED Notes (Signed)
PT to CT.

## 2020-02-25 NOTE — ED Notes (Addendum)
Dr. Karma Greaser at the bedside to discuss CT results and plan of care. Pt and her husband expressing concerns regarding pt returning home with her leg remaining painful with ambulation.

## 2020-02-25 NOTE — ED Notes (Signed)
Pt to MRI

## 2020-02-25 NOTE — Evaluation (Signed)
Physical Therapy Evaluation Patient Details Name: Savannah Strong MRN: [id number] DOB: Apr 20, 1941 Today's Date: 02/25/2020   History of Present Illness  79 y/o female who lost balance while hiking in the woods, hit head.  Imaging reveals L glute/quad partial tears, small subarachnoid hemorrhage.  Clinical Impression  Pt guarded with most activity and initially in standing showed some hesitancy with L WBing, however with cuing and gait training she was able to use walker more effectively and showed ability to ambulate into the hallway with slow but safe/consistent cadence.  She was safe and functional with mobility and will have husband home 24/7 at discharge.      Follow Up Recommendations Home health PT    Equipment Recommendations  Rolling walker with 5" wheels    Recommendations for Other Services       Precautions / Restrictions Precautions Precautions: Fall Restrictions Weight Bearing Restrictions: No      Mobility  Bed Mobility Overal bed mobility: Modified Independent             General bed mobility comments: Pt minimally guarded with transition to sitting, but able to do so w/o assist  Transfers Overall transfer level: Needs assistance Equipment used: Rolling walker (2 wheeled) Transfers: Sit to/from Stand Sit to Stand: Min guard         General transfer comment: Initially very guarded in standing, but quickly becomes more confident and able to stand upright (with walker)  Ambulation/Gait Ambulation/Gait assistance: Min guard Gait Distance (Feet): 75 Feet Assistive device: Rolling walker (2 wheeled)       General Gait Details: Pt was able to walk in the hallway with slow, but consistent gait using walker.  She was reliant on UEs to take the weight (initially has small scale, easily-self-arrested, buckling in the L until she more appropriately used the AD - cuing/explanation from PT).    Stairs            Wheelchair Mobility    Modified Rankin  (Stroke Patients Only)       Balance                                             Pertinent Vitals/Pain Pain Assessment: 0-10 Pain Score: 3  Pain Location: L hip, pain did go up with WBing but managed w/o buckling, etc    Home Living Family/patient expects to be discharged to:: Private residence Living Arrangements: Spouse/significant other Available Help at Discharge: Available 24 hours/day;Family Type of Home: House Home Access: Stairs to enter Entrance Stairs-Rails: Right;Left(wide) Technical brewer of Steps: 3 Home Layout: One level Home Equipment: None      Prior Function Level of Independence: Independent         Comments: Pt normally able to drive, run errands, be active     Hand Dominance        Extremity/Trunk Assessment   Upper Extremity Assessment Upper Extremity Assessment: Overall WFL for tasks assessed    Lower Extremity Assessment Lower Extremity Assessment: Overall WFL for tasks assessed(minimal hesitancy with L hip movement, functional strength)       Communication   Communication: No difficulties  Cognition Arousal/Alertness: Awake/alert Behavior During Therapy: WFL for tasks assessed/performed Overall Cognitive Status: Within Functional Limits for tasks assessed  General Comments General comments (skin integrity, edema, etc.): no ortho consult (or WBing restrictions) at time of PT exam    Exercises     Assessment/Plan    PT Assessment Patient needs continued PT services  PT Problem List Decreased strength;Decreased range of motion;Decreased activity tolerance;Decreased balance;Decreased mobility;Decreased knowledge of use of DME;Decreased safety awareness;Pain;Cardiopulmonary status limiting activity       PT Treatment Interventions DME instruction;Gait training;Stair training;Functional mobility training;Therapeutic exercise;Therapeutic  activities;Balance training;Neuromuscular re-education;Patient/family education    PT Goals (Current goals can be found in the Care Plan section)  Acute Rehab PT Goals Patient Stated Goal: go home PT Goal Formulation: With patient Time For Goal Achievement: 03/10/20 Potential to Achieve Goals: Good    Frequency Min 2X/week   Barriers to discharge        Co-evaluation               AM-PAC PT "6 Clicks" Mobility  Outcome Measure Help needed turning from your back to your side while in a flat bed without using bedrails?: None Help needed moving from lying on your back to sitting on the side of a flat bed without using bedrails?: None Help needed moving to and from a bed to a chair (including a wheelchair)?: A Little Help needed standing up from a chair using your arms (e.g., wheelchair or bedside chair)?: A Little Help needed to walk in hospital room?: A Little Help needed climbing 3-5 steps with a railing? : A Lot 6 Click Score: 19    End of Session Equipment Utilized During Treatment: Gait belt Activity Tolerance: Patient limited by pain Patient left: in bed;with family/visitor present Nurse Communication: Mobility status PT Visit Diagnosis: Muscle weakness (generalized) (M62.81);Difficulty in walking, not elsewhere classified (R26.2);Pain Pain - Right/Left: Left Pain - part of body: Hip    Time: [id number] PT Time Calculation (min) (ACUTE ONLY): 31 min   Charges:   PT Evaluation $PT Eval Low Complexity: 1 Low PT Treatments $Gait Training: 8-22 mins        Kreg Shropshire, DPT 02/25/2020, 10:46 AM

## 2020-02-25 NOTE — ED Notes (Signed)
Pt taken back to stretcher by her husband via wheelchair. Pt states she feels it has gotten easier to transfer between the wheelchair and stretcher. Pt reports she is feeling much better. Husband at the bedside.

## 2020-02-25 NOTE — ED Notes (Addendum)
Pt returned from CT; assisted pt back to stretcher and provided for comfort and safety. Lights off to enhance rest. Will continue to assess.

## 2020-02-25 NOTE — TOC Progression Note (Signed)
Transition of Care [facility]) - Progression Note    Patient Details  Name: Savannah Strong MRN: [id number] Date of Birth: Aug 14, 1941  Transition of Care [facility] CM/SW [place], [place] Phone Number: [phone] 02/25/2020, 11:56 AM  Clinical Narrative:     This CSW went to meet and assess patient.  Patient w/ OT consult.  Will try to see patient later on, patient is going to be admitted as per EDP.        Expected Discharge Plan and Services                                                 Social Determinants of Health (SDOH) Interventions    Readmission Risk Interventions No flowsheet data found.

## 2020-02-25 NOTE — Evaluation (Signed)
Occupational Therapy Evaluation Patient Details Name: Savannah Strong MRN: [id number] DOB: 10-31-1940 Today's Date: 02/25/2020    History of Present Illness 79 y/o female who lost balance while hiking in the woods, hit head.  Imaging reveals L glute/quad partial tears, small subarachnoid hemorrhage.   Clinical Impression   Pt was seen for OT evaluation this date. Prior to hospital admission, pt was active and independent with no additional falls prior to this fall leading to admission. Pt lives with her spouse in a 1 level home and no AD. Currently pt demonstrates impairments as described below (See OT problem list) which functionally limit her ability to perform ADL/self-care tasks. Pt currently requires CGA for ADL mobility and PRN Min A for LB ADL tasks 2/2 increased pain in L hip/buttocks 2/2 muscle tears from fall. Pt/spouse educated in falls prevention strategies, modifications for ADL and ADL mobility to improve pain mgt and safety, and RW mgt for ADL mobility; pt required verbal cues occasionally for sequencing/problem solving. Pt would benefit from skilled OT while hospitalized to address noted impairments and functional limitations (see below for any additional details) in order to maximize safety and independence while minimizing falls risk and caregiver burden. Upon hospital discharge, do not anticipate need for skilled OT services upon discharge. Will continue to assess in future sessions.      Follow Up Recommendations  No OT follow up    Equipment Recommendations  Other (comment)(reacher)    Recommendations for Other Services       Precautions / Restrictions Precautions Precautions: Fall Restrictions Weight Bearing Restrictions: No      Mobility Bed Mobility Overal bed mobility: Modified Independent             General bed mobility comments: increased time/effort with guarding against L hip pain  Transfers Overall transfer level: Needs assistance Equipment used:  Rolling walker (2 wheeled) Transfers: Sit to/from Stand Sit to Stand: Min guard         General transfer comment: improved confidence with additional time up    Balance Overall balance assessment: Needs assistance Sitting-balance support: Feet supported;No upper extremity supported Sitting balance-Leahy Scale: Fair     Standing balance support: Bilateral upper extremity supported Standing balance-Leahy Scale: Fair                             ADL either performed or assessed with clinical judgement   ADL Overall ADL's : Needs assistance/impaired                                       General ADL Comments: close SBA for LB ADL tasks and CGA for ADL transfers     Vision Baseline Vision/History: Wears glasses Wears Glasses: (monovision) Patient Visual Report: No change from baseline Vision Assessment?: No apparent visual deficits     Perception     Praxis      Pertinent Vitals/Pain Pain Assessment: 0-10 Pain Score: 1  Pain Location: L hip, pain did go up with WBing but managed w/o buckling, etc Pain Descriptors / Indicators: Aching;Guarding Pain Intervention(s): Limited activity within patient's tolerance;Monitored during session;Repositioned     Hand Dominance Right   Extremity/Trunk Assessment Upper Extremity Assessment Upper Extremity Assessment: Overall WFL for tasks assessed   Lower Extremity Assessment Lower Extremity Assessment: Overall WFL for tasks assessed(minimal hesitancy with L hip movement, functional strength)  Communication Communication Communication: No difficulties   Cognition Arousal/Alertness: Awake/alert Behavior During Therapy: WFL for tasks assessed/performed Overall Cognitive Status: Impaired/Different from baseline Area of Impairment: Memory;Problem solving                     Memory: Decreased short-term memory       Problem Solving: Slow processing;Difficulty sequencing;Requires  verbal cues General Comments: pt acknowledged some STM deficits since the fall, mild difficulty with sequencing novel tasks requiring verbal cues; pt/spouse endorse not prior cognitive deficits prior to fall   General Comments  no ortho consult/WB restrictions at time of OT evaluation    Exercises Other Exercises Other Exercises: Pt/spouse educated in falls prevention strategies, modifications for ADL and ADL mobility to improve pain mgt and safety, and RW mgt for ADL mobility; pt required verbal cues occasionally for sequencing/problem solving   Shoulder Instructions      Home Living Family/patient expects to be discharged to:: Private residence Living Arrangements: Spouse/significant other Available Help at Discharge: Available 24 hours/day;Family Type of Home: House Home Access: Stairs to enter [employer] of Steps: 3 Entrance Stairs-Rails: Right;Left(wide) Home Layout: One level     Bathroom Shower/Tub: Occupational psychologist: Handicapped height     Home Equipment: None;Shower seat - built in;Hand held shower head          Prior Functioning/Environment Level of Independence: Independent        Comments: Pt normally able to drive, run errands, be active, no other falls        OT Problem List: Pain;Decreased cognition;Decreased safety awareness;Decreased knowledge of use of DME or AE;Impaired balance (sitting and/or standing)      OT Treatment/Interventions: Self-care/ADL training;Therapeutic exercise;Therapeutic activities;DME and/or AE instruction;Patient/family education;Cognitive remediation/compensation;Balance training    OT Goals(Current goals can be found in the care plan section) Acute Rehab OT Goals Patient Stated Goal: go home OT Goal Formulation: With patient/family Time For Goal Achievement: 03/10/20 Potential to Achieve Goals: Good ADL Goals Pt Will Perform Lower Body Dressing: with modified independence;sit to/from stand Pt  Will Transfer to Toilet: with modified independence;ambulating(elevated commode, LRAD for amb) Additional ADL Goal #1: Pt will verbalize plan to implement at least 2 learned falls prevention strategies to maximize safety/indep and minimize falls risk.  OT Frequency: Min 1X/week   Barriers to D/C:            Co-evaluation              AM-PAC OT "6 Clicks" Daily Activity     Outcome Measure Help from another person eating meals?: None Help from another person taking care of personal grooming?: None Help from another person toileting, which includes using toliet, bedpan, or urinal?: A Little Help from another person bathing (including washing, rinsing, drying)?: A Little Help from another person to put on and taking off regular upper body clothing?: None Help from another person to put on and taking off regular lower body clothing?: A Little 6 Click Score: 21   End of Session Equipment Utilized During Treatment: Gait belt;Rolling walker  Activity Tolerance: Patient tolerated treatment well Patient left: in bed;with call bell/phone within reach  OT Visit Diagnosis: Other abnormalities of gait and mobility (R26.89);Pain;History of falling (Z91.81) Pain - Right/Left: Left Pain - part of body: Hip;Leg                Time: [id number] OT Time Calculation (min): 33 min Charges:  OT General Charges $OT Visit: 1 Visit OT  Evaluation $OT Eval Moderate Complexity: 1 Mod OT Treatments $Self Care/Home Management : 8-22 mins  Jeni Salles, MPH, MS, OTR/L ascom [phone] 02/25/20, 1:05 PM

## 2020-02-25 NOTE — Discharge Instructions (Addendum)
Take Keppra to prevent seizure. Follow up with primary doctor Friday for BP recheck. Return to ER for worsening confusion or any other concerns. Set up follow up with Neurosurgery, number above.

## 2020-02-25 NOTE — ED Notes (Signed)
Pt given meal tray.

## 2020-02-25 NOTE — ED Notes (Signed)
Pt assisted to restroom via wheelchair. Husband at the bedside. Transferred without difficulty.

## 2020-02-25 NOTE — TOC Initial Note (Signed)
Transition of Care [facility]) - Initial/Assessment Note    Patient Details  Name: Savannah Strong MRN: [id number] Date of Birth: 1941/01/05  [facility] of [facility] CM/SW Contact:    Ova Freshwater Phone Number: [phone] 02/25/2020, 10:39 AM  Clinical Narrative:                  Patient presents to [facility]/ED due to fall.  TOC aware of consult.  This CSW attempted to contact patient's husband for collateral information.          Patient Goals and CMS Choice        Expected Discharge Plan and Services                                                Prior Living Arrangements/Services                       Activities of Daily Living      Permission Sought/Granted                  Emotional Assessment              Admission diagnosis:  Fall [W19.XXXA] Patient Active Problem List   Diagnosis Date Noted  . Hypertension   . Fall   . SAH (subarachnoid hemorrhage) ([place])   . IBS (irritable bowel syndrome)    PCP:  Ellender Hose, MD Pharmacy:   [place], [place] - [address] [place] [address] Phone: [phone] Fax: [phone]     Social Determinants of Health (SDOH) Interventions    Readmission Risk Interventions No flowsheet data found.

## 2020-02-25 NOTE — H&P (Addendum)
History and Physical    Savannah Strong [id number] DOB: 26-Oct-1940 DOA: 02/24/2020  Referring MD/NP/PA:   PCP: Ellender Hose, MD   Patient coming from:  The patient is coming from home.  At baseline, pt is independent for most of ADL.        Chief Complaint: fall  HPI: Savannah Strong is a 79 y.o. female with medical history significant of hypertension, left breast cancer (mastectomy), IBS, migraine headaches, who presents with a fall.  Pt states that she fell when she lost her balance on a walking trail down a steep hill. She has bruise over her left face, left knee and both palms. She developed pain in left hip. She cannot not walk normally due to the pain. No unilateral numbness or tingling to extremities.  No facial droop or slurred speech.  Patient has mild confusion per her husband.  When I saw patient in ED, patient is alert, oriented x3. Patient does not have chest pain, shortness breath, cough, fever or chills.  No nausea vomiting, diarrhea, abdominal pain, symptoms of UTI.   ED Course: pt was found to have WBC 9.9, INR 0.9, negative COVID-19 PCR, electrolyte renal function okay, temperature normal, blood pressure 195/99, which improved to 131/69 after giving 10 mg of labetalol IV in ED, heart rate 83, oxygen saturation 95% on room air.  Patient is placed on MedSurg bed for observation. CT-head showed SAH on 5/5. The repeated CT-head showed stable SAH 5/6. Neurosurgeon, Dr. Izora Ribas is consulted. MRI of left hip showed partial tears of the gluteus medius and vastus lateralis muscles. Pt is placed on med-surg bed for obs.  # CT-head on 5/5 showed: Patchy small volume acute posttraumatic subarachnoid and/or extra-axial hemorrhage overlying the right greater than left frontal convexities as above, with a few additional small foci of acute intraventricular hemorrhage. No associated mass effect, hydrocephalus, or other complication.   # the repeated CT-head showed:  1. Unchanged subarachnoid  hemorrhage along the sulci of the anterior right frontal convexity and parasagittal anterior left frontal lobe,similar to prior. 2. Slight interval increase in the attenuation of the foci along the septum pellucidum, may reflect mild blooming or technique related effect but without significant increase in size. 3. No new sites of hemorrhage. No mass effect or midline shift. 4. Left periorbital contusive changes, similar to prior. 5. Chronic microvascular angiopathy and moderate parenchyml volume loss.  # CT of C spin: Negative for bony fracture, 2 degenerative disc disease  # X-ray of left hip, bilateral wrist and left femur: negative for bone fracture.  # MRI of left hip:  1. No acute hip fracture or pelvic fractures. 2. Partial tears of the gluteus medius and vastus lateralis muscles. 3. No significant intrapelvic abnormalities.  # CXR showed:  1. Age indeterminate mild anterior wedge compression deformity at approximately the L1 level. 2. Otherwise no acute intrathoracic process.   Review of Systems:   General: no fevers, chills, no body weight gain, has fatigue HEENT: no blurry vision, hearing changes or sore throat Respiratory: no dyspnea, coughing, wheezing CV: no chest pain, no palpitations GI: no nausea, vomiting, abdominal pain, diarrhea, constipation GU: no dysuria, burning on urination, increased urinary frequency, hematuria  Ext: no leg edema Neuro: no unilateral weakness, numbness, or tingling, no vision change or hearing loss. Has fall Skin: has bruise MSK: has left hip pain. Heme: No easy bruising.  Travel history: No recent long distant travel.  Allergy:  Allergies  Allergen Reactions  . Sulfa  Antibiotics Rash    Past Medical History:  Diagnosis Date  . Cancer ([place])    Left breast with mast  . Hypertension   . IBS (irritable bowel syndrome)   . Migraines     Past Surgical History:  Procedure Laterality Date  . masectomy    . TONSILLECTOMY       Social History:  reports that she has quit smoking. She has never used smokeless tobacco. She reports current alcohol use. She reports that she does not use drugs.  Family History:  Family History  Problem Relation Age of Onset  . Stomach cancer Mother      Prior to Admission medications   Medication Sig Start Date End Date Taking? Authorizing Provider  acetaminophen (TYLENOL) 500 MG tablet Take 500 mg by mouth 3 (three) times daily.   Yes [provider]  albuterol (VENTOLIN HFA) 108 (90 Base) MCG/ACT inhaler Inhale 2 puffs into the lungs every 4 (four) hours as needed for wheezing.   Yes [provider]  Beclomethasone Dipropionate 80 MCG/ACT AERS Place 2 sprays into the nose daily. 12/26/17  Yes [provider]  cholecalciferol (VITAMIN D) 25 MCG (1000 UNIT) tablet Take 1,000 Units by mouth daily.   Yes [provider]  fluticasone (FLONASE) 50 MCG/ACT nasal spray Place 2 sprays into both nostrils daily.   Yes [provider]  fluticasone furoate-vilanterol (BREO ELLIPTA) 100-25 MCG/INH AEPB Inhale 1 puff into the lungs daily.    Yes [provider]  loperamide (IMODIUM) 2 MG capsule Take 2 mg by mouth as needed for diarrhea or loose stools.   Yes [provider]  loratadine (CLARITIN) 10 MG tablet Take 10 mg by mouth daily.   Yes [provider]  ondansetron (ZOFRAN ODT) 4 MG disintegrating tablet Take 1 tablet (4 mg total) by mouth every 8 (eight) hours as needed for nausea or vomiting. 09/15/15  Yes Joanne Gavel, MD  VENLAFAXINE HCL PO Take 1 Dose by mouth daily.   Yes [provider]  levETIRAcetam (KEPPRA) 250 MG tablet Take 1 tablet (250 mg total) by mouth 2 (two) times daily for 7 days. 02/24/20 03/02/20  Vanessa La Pryor, MD    Physical Exam: Vitals:   02/25/20 0930 02/25/20 1000 02/25/20 1045 02/25/20 1100  BP: 97/66 134/66 131/66 128/64  Pulse: 72 75 77 78  Resp: (!) 21 19 16 15   Temp:       TempSrc:      SpO2: 97% 98% 98% 98%  Weight:      Height:       General: Not in acute distress HEENT:       Eyes: PERRL, EOMI, no scleral icterus.       ENT: No discharge from the ears and nose, no pharynx injection, no tonsillar enlargement.        Neck: No JVD, no bruit, no mass felt. Heme: No neck lymph node enlargement. Cardiac: S1/S2, RRR, No murmurs, No gallops or rubs. Respiratory:  No rales, wheezing, rhonchi or rubs. GI: Soft, nondistended, nontender, no rebound pain, no organomegaly, BS present. GU: No hematuria Ext: No pitting leg edema bilaterally. 1+DP/PT pulse bilaterally. Musculoskeletal: has tenderness in left hip Skin: has bruise over her left face, left knee and both palms Neuro: Alert, oriented X3, cranial nerves II-XII grossly intact, moves all extremities. Labs on Admission: I have personally reviewed following labs and imaging studies  CBC: Recent Labs  Lab 02/24/20 1907  WBC 9.9  HGB 13.1  HCT 38.9  MCV 93.1  PLT 0000000   Basic Metabolic Panel: Recent Labs  Lab 02/24/20 1907  NA 134*  K 3.8  CL 101  CO2 26  GLUCOSE 109*  BUN 24*  CREATININE 1.03*  CALCIUM 9.0   GFR: Estimated Creatinine Clearance: 48.8 mL/min (A) (by C-G formula based on SCr of 1.03 mg/dL (H)). Liver Function Tests: No results for input(s): AST, ALT, ALKPHOS, BILITOT, PROT, ALBUMIN in the last 168 hours. No results for input(s): LIPASE, AMYLASE in the last 168 hours. No results for input(s): AMMONIA in the last 168 hours. Coagulation Profile: Recent Labs  Lab 02/24/20 1907  INR 0.9   Cardiac Enzymes: No results for input(s): CKTOTAL, CKMB, CKMBINDEX, TROPONINI in the last 168 hours. BNP (last 3 results) No results for input(s): PROBNP in the last 8760 hours. HbA1C: No results for input(s): HGBA1C in the last 72 hours. CBG: No results for input(s): GLUCAP in the last 168 hours. Lipid Profile: No results for input(s): CHOL, HDL, LDLCALC, TRIG, CHOLHDL, LDLDIRECT in  the last 72 hours. Thyroid Function Tests: No results for input(s): TSH, T4TOTAL, FREET4, T3FREE, THYROIDAB in the last 72 hours. Anemia Panel: No results for input(s): VITAMINB12, FOLATE, FERRITIN, TIBC, IRON, RETICCTPCT in the last 72 hours. Urine analysis:    Component Value Date/Time   COLORURINE STRAW (A) 09/15/2015 0849   APPEARANCEUR CLEAR (A) 09/15/2015 0849   LABSPEC 1.027 09/15/2015 0849   PHURINE 8.0 09/15/2015 0849   GLUCOSEU NEGATIVE 09/15/2015 0849   HGBUR NEGATIVE 09/15/2015 0849   BILIRUBINUR NEGATIVE 09/15/2015 0849   KETONESUR NEGATIVE 09/15/2015 0849   PROTEINUR NEGATIVE 09/15/2015 0849   NITRITE NEGATIVE 09/15/2015 0849   LEUKOCYTESUR NEGATIVE 09/15/2015 0849   Sepsis Labs: @LABRCNTIP (procalcitonin:4,lacticidven:4) ) Recent Results (from the past 240 hour(s))  Respiratory Panel by RT PCR (Flu A&B, Covid) - Nasopharyngeal Swab     Status: None   Collection Time: 02/24/20  9:12 PM   Specimen: Nasopharyngeal Swab  Result Value Ref Range Status   SARS Coronavirus 2 by RT PCR NEGATIVE NEGATIVE Final    Comment: (NOTE) SARS-CoV-2 target nucleic acids are NOT DETECTED. The SARS-CoV-2 RNA is generally detectable in upper respiratoy specimens during the acute phase of infection. The lowest concentration of SARS-CoV-2 viral copies this assay can detect is 131 copies/mL. A negative result does not preclude SARS-Cov-2 infection and should not be used as the sole basis for treatment or other patient management decisions. A negative result may occur with  improper specimen collection/handling, submission of specimen other than nasopharyngeal swab, presence of viral mutation(s) within the areas targeted by this assay, and inadequate number of viral copies (<131 copies/mL). A negative result must be combined with clinical observations, patient history, and epidemiological information. The expected result is Negative. Fact Sheet for Patients:   PinkCheek.be Fact Sheet for Healthcare Providers:  GravelBags.it This test is not yet ap proved or cleared by the [place] FDA and  has been authorized for detection and/or diagnosis of SARS-CoV-2 by FDA under an Emergency Use Authorization (EUA). This EUA will remain  in effect (meaning this test can be used) for the duration of the COVID-19 declaration under Section 564(b)(1) of the Act, 21 U.S.C. section 360bbb-3(b)(1), unless the authorization is terminated or revoked sooner.    Influenza A by PCR NEGATIVE NEGATIVE Final   Influenza B by PCR NEGATIVE NEGATIVE Final    Comment: (NOTE) The Xpert Xpress SARS-CoV-2/FLU/RSV assay is intended as an aid in  the diagnosis of influenza  from Nasopharyngeal swab specimens and  should not be used as a sole basis for treatment. Nasal washings and  aspirates are unacceptable for Xpert Xpress SARS-CoV-2/FLU/RSV  testing. Fact Sheet for Patients: PinkCheek.be Fact Sheet for Healthcare Providers: GravelBags.it This test is not yet approved or cleared by the [place] FDA and  has been authorized for detection and/or diagnosis of SARS-CoV-2 by  FDA under an Emergency Use Authorization (EUA). This EUA will remain  in effect (meaning this test can be used) for the duration of the  Covid-19 declaration under Section 564(b)(1) of the Act, 21  U.S.C. section 360bbb-3(b)(1), unless the authorization is  terminated or revoked. Performed at [facility], [address] [place], [place] [address]      Radiological Exams on Admission: DG Chest 2 View  Result Date: 02/24/2020 CLINICAL DATA:  [place], rib pain EXAM: CHEST - 2 VIEW COMPARISON:  None. FINDINGS: Frontal and lateral views of the chest demonstrate an unremarkable cardiac silhouette. No airspace disease, effusion, or pneumothorax. Postsurgical changes from left  mastectomy. On lateral view there is mild anterior wedging at the thoracolumbar junction, likely at the L1 vertebral body. Less than 10% loss of height anteriorly. This is consistent with age-indeterminate compression fracture. No other acute bony abnormalities. IMPRESSION: 1. Age indeterminate mild anterior wedge compression deformity at approximately the L1 level. 2. Otherwise no acute intrathoracic process. Electronically Signed   By: Randa Ngo M.D.   On: 02/24/2020 19:58   DG Wrist Complete Left  Result Date: 02/24/2020 CLINICAL DATA:  [place], abrasions EXAM: LEFT WRIST - COMPLETE 3+ VIEW COMPARISON:  None. FINDINGS: Frontal, oblique, lateral, and ulnar deviated views of the left wrist are obtained. No fracture, subluxation, or dislocation. Mild osteoarthritis at the scaphoid trapezial joint. Soft tissues are unremarkable. IMPRESSION: 1. No acute displaced fracture. 2. Osteoarthritis. Electronically Signed   By: Randa Ngo M.D.   On: 02/24/2020 20:47   DG Wrist Complete Right  Result Date: 02/24/2020 CLINICAL DATA:  [place], abrasions EXAM: RIGHT WRIST - COMPLETE 3+ VIEW COMPARISON:  03/03/2019 FINDINGS: Frontal, oblique, lateral, and ulnar deviated views of the right wrist are obtained. No fracture, subluxation, or dislocation. Mild osteoarthritis within the radial aspect of the carpus. Soft tissues are unremarkable. IMPRESSION: 1. No acute displaced fracture. 2. Mild osteoarthritis. Electronically Signed   By: Randa Ngo M.D.   On: 02/24/2020 20:49   CT Head Wo Contrast  Result Date: 02/25/2020 CLINICAL DATA:  Follow-up intracranial hemorrhage EXAM: CT HEAD WITHOUT CONTRAST TECHNIQUE: Contiguous axial images were obtained from the base of the skull through the vertex without intravenous contrast. COMPARISON:  CT 02/24/2020 FINDINGS: Brain: Small volume subarachnoid hemorrhage again seen along the sulci in the anterior right right frontal convexity (2/7) and trace extra-axial hemorrhage in the  parasagittal anterior left frontal lobe best seen on coronal imaging (4/14). Slight interval increase in the attenuation of the foci along the septum pellucidum, may reflect mild blooming or technique related effect but without significant increase in size again measuring up to 8 mm maximally. No new sites of hemorrhage. No CT evidence of acute large territory infarct. No mass effect or midline shift. Confluent areas of white matter hypoattenuation are most compatible with chronic microvascular angiopathy. Symmetric prominence of the ventricles, cisterns and sulci compatible with moderate parenchymal volume loss. Vascular: Intracranial atherosclerosis. No hyperdense vessels. Skull: Left periorbital contusive changes, similar to prior. No expansile hematoma or acute osseous injury. Sinuses/Orbits: Paranasal sinuses and mastoid air cells are predominantly clear.  Included orbital structures are unremarkable. Other: None. IMPRESSION: 1. Unchanged subarachnoid hemorrhage along the sulci of the anterior right frontal convexity and parasagittal anterior left frontal lobe, similar to prior. 2. Slight interval increase in the attenuation of the foci along the septum pellucidum, may reflect mild blooming or technique related effect but without significant increase in size. 3. No new sites of hemorrhage. No mass effect or midline shift. 4. Left periorbital contusive changes, similar to prior. 5. Chronic microvascular angiopathy and moderate parenchymal volume loss. Electronically Signed   By: Lovena Le M.D.   On: 02/25/2020 02:30   CT HEAD WO CONTRAST  Result Date: 02/24/2020 CLINICAL DATA:  Initial evaluation for acute trauma, fall. EXAM: CT HEAD WITHOUT CONTRAST CT MAXILLOFACIAL WITHOUT CONTRAST CT CERVICAL SPINE WITHOUT CONTRAST TECHNIQUE: Multidetector CT imaging of the head, cervical spine, and maxillofacial structures were performed using the standard protocol without intravenous contrast. Multiplanar CT image  reconstructions of the cervical spine and maxillofacial structures were also generated. COMPARISON:  None. FINDINGS: CT HEAD FINDINGS Brain: Generalized age-related cerebral atrophy. Small volume acute subarachnoid hemorrhage seen overlying the anterior right frontal convexity (series 3, image 10). Additional trace subarachnoid versus extra-axial hemorrhage overlying the parasagittal anterior left frontal convexity (series 3, image 20). Few small hyperdensities seen along the septum pellucidum consistent with small volume intraventricular hemorrhage, largest of which measures 8 mm (series 3, images 15, 18). No other definite intracranial hemorrhage. No acute large vessel territory infarct. No mass lesion or midline shift. Mild ventricular prominence related to global parenchymal volume loss without hydrocephalus. No other extra-axial collection. Vascular: No hyperdense vessel. Skull: Left periorbital contusion.  Calvarium intact. Other: Mastoid air cells are clear. CT MAXILLOFACIAL FINDINGS Osseous: Zygomatic arches intact. No acute maxillary fracture. Pterygoid plates intact. Nasal bones intact. Nasal septum relatively midline and intact. No acute mandibular fracture. Mandibular condyles normally situated. No acute abnormality about the dentition. Orbits: Globes and orbital soft tissues within normal limits. Bony orbits intact. Sinuses: Paranasal sinuses are clear. Soft tissues: Acute left periorbital/facial soft tissue contusion. CT CERVICAL SPINE FINDINGS Alignment: Straightening of the normal cervical lordosis. No listhesis or malalignment. Skull base and vertebrae: Skull base intact. Normal C1-2 articulations are preserved in the dens is intact. Vertebral body heights maintained. No acute fracture. Soft tissues and spinal canal: Soft tissues of the neck demonstrate no acute finding. No abnormal prevertebral edema. Spinal canal within normal limits. Disc levels: Moderate degenerative spondylosis noted at C4-5  through C6-7 without significant stenosis. Upper chest: Visualized upper chest demonstrates no acute finding. Irregular pleuroparenchymal thickening noted at the right lung apex. Other: None. IMPRESSION: CT HEAD: 1. Patchy small volume acute posttraumatic subarachnoid and/or extra-axial hemorrhage overlying the right greater than left frontal convexities as above, with a few additional small foci of acute intraventricular hemorrhage. No associated mass effect, hydrocephalus, or other complication. 2. Age-related cerebral atrophy. No other acute intracranial abnormality. CT MAXILLOFACIAL: 1. Acute left periorbital/facial soft tissue contusion. 2. No other acute maxillofacial injury.  No fracture. CT CERVICAL SPINE: 1. No acute traumatic injury within the cervical spine. 2. Moderate degenerative spondylosis at C4-5 through C6-7. Critical Value/emergent results were called by telephone at the time of interpretation on 02/24/2020 at 8:38 pm to provider [facility] , who verbally acknowledged these results. Electronically Signed   By: Jeannine Boga M.D.   On: 02/24/2020 20:39   CT Cervical Spine Wo Contrast  Result Date: 02/24/2020 CLINICAL DATA:  Initial evaluation for acute trauma, fall. EXAM: CT HEAD WITHOUT  CONTRAST CT MAXILLOFACIAL WITHOUT CONTRAST CT CERVICAL SPINE WITHOUT CONTRAST TECHNIQUE: Multidetector CT imaging of the head, cervical spine, and maxillofacial structures were performed using the standard protocol without intravenous contrast. Multiplanar CT image reconstructions of the cervical spine and maxillofacial structures were also generated. COMPARISON:  None. FINDINGS: CT HEAD FINDINGS Brain: Generalized age-related cerebral atrophy. Small volume acute subarachnoid hemorrhage seen overlying the anterior right frontal convexity (series 3, image 10). Additional trace subarachnoid versus extra-axial hemorrhage overlying the parasagittal anterior left frontal convexity (series 3, image 20). Few small  hyperdensities seen along the septum pellucidum consistent with small volume intraventricular hemorrhage, largest of which measures 8 mm (series 3, images 15, 18). No other definite intracranial hemorrhage. No acute large vessel territory infarct. No mass lesion or midline shift. Mild ventricular prominence related to global parenchymal volume loss without hydrocephalus. No other extra-axial collection. Vascular: No hyperdense vessel. Skull: Left periorbital contusion.  Calvarium intact. Other: Mastoid air cells are clear. CT MAXILLOFACIAL FINDINGS Osseous: Zygomatic arches intact. No acute maxillary fracture. Pterygoid plates intact. Nasal bones intact. Nasal septum relatively midline and intact. No acute mandibular fracture. Mandibular condyles normally situated. No acute abnormality about the dentition. Orbits: Globes and orbital soft tissues within normal limits. Bony orbits intact. Sinuses: Paranasal sinuses are clear. Soft tissues: Acute left periorbital/facial soft tissue contusion. CT CERVICAL SPINE FINDINGS Alignment: Straightening of the normal cervical lordosis. No listhesis or malalignment. Skull base and vertebrae: Skull base intact. Normal C1-2 articulations are preserved in the dens is intact. Vertebral body heights maintained. No acute fracture. Soft tissues and spinal canal: Soft tissues of the neck demonstrate no acute finding. No abnormal prevertebral edema. Spinal canal within normal limits. Disc levels: Moderate degenerative spondylosis noted at C4-5 through C6-7 without significant stenosis. Upper chest: Visualized upper chest demonstrates no acute finding. Irregular pleuroparenchymal thickening noted at the right lung apex. Other: None. IMPRESSION: CT HEAD: 1. Patchy small volume acute posttraumatic subarachnoid and/or extra-axial hemorrhage overlying the right greater than left frontal convexities as above, with a few additional small foci of acute intraventricular hemorrhage. No associated  mass effect, hydrocephalus, or other complication. 2. Age-related cerebral atrophy. No other acute intracranial abnormality. CT MAXILLOFACIAL: 1. Acute left periorbital/facial soft tissue contusion. 2. No other acute maxillofacial injury.  No fracture. CT CERVICAL SPINE: 1. No acute traumatic injury within the cervical spine. 2. Moderate degenerative spondylosis at C4-5 through C6-7. Critical Value/emergent results were called by telephone at the time of interpretation on 02/24/2020 at 8:38 pm to provider [facility] , who verbally acknowledged these results. Electronically Signed   By: Jeannine Boga M.D.   On: 02/24/2020 20:39   MR HIP LEFT WO CONTRAST  Result Date: 02/25/2020 CLINICAL DATA:  [place]. Unable to bear weight. Negative x-rays. EXAM: MR OF THE LEFT HIP WITHOUT CONTRAST TECHNIQUE: Multiplanar, multisequence MR imaging was performed. No intravenous contrast was administered. COMPARISON:  Radiographs 02/24/2020 FINDINGS: Both hips are normally located. No acute hip fracture. No significant degenerative changes given the patient's age. No findings for avascular necrosis. The pubic symphysis and SI joints are intact. No pelvic fractures or bone lesions. No pubic rami fractures. Subcutaneous contusion noted over the left hip without large hematoma. There also partial tears of the gluteus medius muscle and also the upper aspect of the vastus lateralis muscle. No significant intrapelvic abnormalities are identified. IMPRESSION: 1. No acute hip fracture or pelvic fractures. 2. Partial tears of the gluteus medius and vastus lateralis muscles. 3. No significant intrapelvic abnormalities. Electronically Signed  By: Marijo Sanes M.D.   On: 02/25/2020 07:53   DG Hip Unilat With Pelvis 2-3 Views Left  Result Date: 02/24/2020 CLINICAL DATA:  [place], confusion, bilateral hip pain EXAM: DG HIP (WITH OR WITHOUT PELVIS) 2-3V LEFT; DG HIP (WITH OR WITHOUT PELVIS) 2-3V RIGHT COMPARISON:  None. FINDINGS: Frontal view  of the pelvis as well as frontal and frogleg lateral views of both hips are obtained. Right hip: No acute displaced fracture. Mild osteoarthritis. Alignment is anatomic. Remainder of the visualized bony pelvis is unremarkable. Soft tissues are normal. Left hip: No acute displaced fracture. Alignment is anatomic. Mild osteoarthritis. Soft tissues are unremarkable. IMPRESSION: 1. Mild bilateral hip osteoarthritis. 2. No acute hip fractures.  Unremarkable bony pelvis. Electronically Signed   By: Randa Ngo M.D.   On: 02/24/2020 19:56   DG Hip Unilat  With Pelvis 2-3 Views Right  Result Date: 02/24/2020 CLINICAL DATA:  [place], confusion, bilateral hip pain EXAM: DG HIP (WITH OR WITHOUT PELVIS) 2-3V LEFT; DG HIP (WITH OR WITHOUT PELVIS) 2-3V RIGHT COMPARISON:  None. FINDINGS: Frontal view of the pelvis as well as frontal and frogleg lateral views of both hips are obtained. Right hip: No acute displaced fracture. Mild osteoarthritis. Alignment is anatomic. Remainder of the visualized bony pelvis is unremarkable. Soft tissues are normal. Left hip: No acute displaced fracture. Alignment is anatomic. Mild osteoarthritis. Soft tissues are unremarkable. IMPRESSION: 1. Mild bilateral hip osteoarthritis. 2. No acute hip fractures.  Unremarkable bony pelvis. Electronically Signed   By: Randa Ngo M.D.   On: 02/24/2020 19:56   DG Femur Min 2 Views Left  Result Date: 02/24/2020 CLINICAL DATA:  79 year old female with fall and left hip pain. EXAM: LEFT FEMUR 2 VIEWS COMPARISON:  Left hip radiograph dated 02/24/2020. FINDINGS: There is no acute fracture or dislocation. The bones are mildly osteopenic. Mild arthritic changes of the left knee. No joint effusion. The soft tissues are unremarkable. IMPRESSION: No acute fracture or dislocation. Electronically Signed   By: Anner Crete M.D.   On: 02/24/2020 23:22   CT Maxillofacial Wo Contrast  Result Date: 02/24/2020 CLINICAL DATA:  Initial evaluation for acute trauma,  fall. EXAM: CT HEAD WITHOUT CONTRAST CT MAXILLOFACIAL WITHOUT CONTRAST CT CERVICAL SPINE WITHOUT CONTRAST TECHNIQUE: Multidetector CT imaging of the head, cervical spine, and maxillofacial structures were performed using the standard protocol without intravenous contrast. Multiplanar CT image reconstructions of the cervical spine and maxillofacial structures were also generated. COMPARISON:  None. FINDINGS: CT HEAD FINDINGS Brain: Generalized age-related cerebral atrophy. Small volume acute subarachnoid hemorrhage seen overlying the anterior right frontal convexity (series 3, image 10). Additional trace subarachnoid versus extra-axial hemorrhage overlying the parasagittal anterior left frontal convexity (series 3, image 20). Few small hyperdensities seen along the septum pellucidum consistent with small volume intraventricular hemorrhage, largest of which measures 8 mm (series 3, images 15, 18). No other definite intracranial hemorrhage. No acute large vessel territory infarct. No mass lesion or midline shift. Mild ventricular prominence related to global parenchymal volume loss without hydrocephalus. No other extra-axial collection. Vascular: No hyperdense vessel. Skull: Left periorbital contusion.  Calvarium intact. Other: Mastoid air cells are clear. CT MAXILLOFACIAL FINDINGS Osseous: Zygomatic arches intact. No acute maxillary fracture. Pterygoid plates intact. Nasal bones intact. Nasal septum relatively midline and intact. No acute mandibular fracture. Mandibular condyles normally situated. No acute abnormality about the dentition. Orbits: Globes and orbital soft tissues within normal limits. Bony orbits intact. Sinuses: Paranasal sinuses are clear. Soft tissues: Acute left periorbital/facial soft tissue  contusion. CT CERVICAL SPINE FINDINGS Alignment: Straightening of the normal cervical lordosis. No listhesis or malalignment. Skull base and vertebrae: Skull base intact. Normal C1-2 articulations are preserved  in the dens is intact. Vertebral body heights maintained. No acute fracture. Soft tissues and spinal canal: Soft tissues of the neck demonstrate no acute finding. No abnormal prevertebral edema. Spinal canal within normal limits. Disc levels: Moderate degenerative spondylosis noted at C4-5 through C6-7 without significant stenosis. Upper chest: Visualized upper chest demonstrates no acute finding. Irregular pleuroparenchymal thickening noted at the right lung apex. Other: None. IMPRESSION: CT HEAD: 1. Patchy small volume acute posttraumatic subarachnoid and/or extra-axial hemorrhage overlying the right greater than left frontal convexities as above, with a few additional small foci of acute intraventricular hemorrhage. No associated mass effect, hydrocephalus, or other complication. 2. Age-related cerebral atrophy. No other acute intracranial abnormality. CT MAXILLOFACIAL: 1. Acute left periorbital/facial soft tissue contusion. 2. No other acute maxillofacial injury.  No fracture. CT CERVICAL SPINE: 1. No acute traumatic injury within the cervical spine. 2. Moderate degenerative spondylosis at C4-5 through C6-7. Critical Value/emergent results were called by telephone at the time of interpretation on 02/24/2020 at 8:38 pm to provider [facility] , who verbally acknowledged these results. Electronically Signed   By: Jeannine Boga M.D.   On: 02/24/2020 20:39     EKG: Independently reviewed. Sinus rhythm, QTC 459, low voltage, LAE, LAD, nonspecific T wave change.  Assessment/Plan Principal Problem:   Fall Active Problems:   Hypertension   SAH (subarachnoid hemorrhage) (HCC)   IBS (irritable bowel syndrome)   Fall: Patient had mechanical fall.  She has significant left hip pain, cannot walk normally.  MRI of her left hip is negative for bony fracture, but showed partial tears of the gluteus medius and vastus lateralis muscles.  -place on MedSurg bed for observation -PT/OT -As needed Percocet,  Robaxin, morphine for pain  HTN:  Pt is not taking Bp meds at home -hydralazine prn  SAH (subarachnoid hemorrhage) ([place]): Dr. Izora Ribas of neurosurgery is consulted.  The repeated CT scan of head showed stable SAH -Frequent neuro check -Keppra to 50 mg twice daily for 1 week per Dr. Izora Ribas of neurosurgeon's recommendation  IBS (irritable bowel syndrome) -As needed Imodium    DVT ppx: SCD Code Status: partial code (OK for CPR, no intubation) (I discussed with the patient in the presence of her husband), and explained the meaning of CODE STATUS, patient wants to be partial code, OK for CPR, but no intubation). Family Communication:  Yes, patient's  husband at bed side Disposition Plan:  Anticipate discharge back to previous home environment Consults called:  Dr. Cari Caraway of neurosurgery Admission status: Med-surg bed for obs   Status is: Inpatient.  Patient has been in hospital since 5/5.  Patient still has significant pain in the left hip, cannot walk normally.  Patient will need more PT/OT and pain control.   Remains inpatient appropriate because:Inpatient level of care appropriate due to severity of illness   Dispo: The patient is from: home              Anticipated d/c is to: to be determined              Anticipated d/c date is: 2 days              Patient currently is not medically stable to d/c.            Date of Service 02/25/2020    [facility]  [facility]   If 7PM-7AM, please contact night-coverage [email or web address] 02/25/2020, 12:04 PM

## 2020-02-25 NOTE — ED Notes (Signed)
Pt able to use walker to go to bathroom

## 2020-02-25 NOTE — TOC Progression Note (Signed)
Transition of Care [facility]) - Progression Note    Patient Details  Name: Savannah Strong MRN: [id number] Date of Birth: 1941-06-07  Transition of Care [facility]) CM/SW [place], [place] Phone Number: [phone] 02/25/2020, 1:53 PM  Clinical Narrative:     This CSW met with patient and her husband Delfino Lovett, who is at bedside.  Patient is oriented 4X.  Patient is completely independent w/ ADLs, no incontinence or difficulty ambulating before fall. Patient will be admitted, as per EDP. PT recommended home health services for patient, after discharge, and this CSW spoke w/ [facility].  Corene Cornea will meet w/ patient to discuss home health svcs.   Patient and husband are both retired and husband will assist patient w/ recovery at home.        Expected Discharge Plan and Services                                                 Social Determinants of Health (SDOH) Interventions    Readmission Risk Interventions No flowsheet data found.

## 2020-02-25 NOTE — Consult Note (Signed)
Referring Physician:  No referring provider defined for this encounter.  Primary Physician:  Ellender Hose, MD  Chief Complaint:  fall  History of Present Illness: 02/25/2020 Savannah Strong is a 79 y.o. female who presents with the chief complaint of fall yesterday after moving too fast on an incline.  She had a mechanical fall that resulted in significant impact on her head.  She presented for evaluation, and was found to have a small SAH.  She had trouble bearing weight today, and was found to have torn muscles in her left gluteal region.  She denies HA N V currently.  Her husband thinks she is close to baseline.   Review of Systems:  A 10 point review of systems is negative, except for the pertinent positives and negatives detailed in the HPI.  Past Medical History: Past Medical History:  Diagnosis Date  . Cancer ([place])    Left breast with mast  . Hypertension   . IBS (irritable bowel syndrome)   . Migraines     Past Surgical History: Past Surgical History:  Procedure Laterality Date  . masectomy    . TONSILLECTOMY      Allergies: Allergies as of 02/24/2020 - Review Complete 02/24/2020  Allergen Reaction Noted  . Sulfa antibiotics  07/16/2015    Medications:  Current Facility-Administered Medications:  .  acetaminophen (TYLENOL) tablet 650 mg, 650 mg, Oral, Q6H PRN, Ivor Costa, MD, 650 mg at 02/25/20 1749 .  albuterol (PROVENTIL) (2.5 MG/3ML) 0.083% nebulizer solution 2.5 mg, 2.5 mg, Inhalation, Q4H PRN, Ivor Costa, MD .  cholecalciferol (VITAMIN D3) tablet 1,000 Units, 1,000 Units, Oral, Daily, Ivor Costa, MD, 1,000 Units at 02/25/20 1037 .  fluticasone (FLONASE) 50 MCG/ACT nasal spray 2 spray, 2 spray, Each Nare, Daily, Ivor Costa, MD, 2 spray at 02/25/20 1037 .  fluticasone furoate-vilanterol (BREO ELLIPTA) 100-25 MCG/INH 1 puff, 1 puff, Inhalation, Daily, Ivor Costa, MD, 1 puff at 02/25/20 1039 .  hydrALAZINE (APRESOLINE) injection 5 mg, 5 mg, Intravenous, Q2H  PRN, Ivor Costa, MD .  levETIRAcetam (KEPPRA) tablet 250 mg, 250 mg, Oral, BID, Ivor Costa, MD, 250 mg at 02/25/20 1304 .  loperamide (IMODIUM) capsule 2 mg, 2 mg, Oral, Daily PRN, Ivor Costa, MD .  loratadine (CLARITIN) tablet 10 mg, 10 mg, Oral, Daily, Ivor Costa, MD, 10 mg at 02/25/20 1037 .  methocarbamol (ROBAXIN) tablet 500 mg, 500 mg, Oral, Q8H PRN, Ivor Costa, MD .  morphine 2 MG/ML injection 0.5 mg, 0.5 mg, Intravenous, Q3H PRN, Ivor Costa, MD .  ondansetron (ZOFRAN-ODT) disintegrating tablet 4 mg, 4 mg, Oral, Q8H PRN, Ivor Costa, MD .  oxyCODONE-acetaminophen (PERCOCET/ROXICET) 5-325 MG per tablet 1 tablet, 1 tablet, Oral, Q4H PRN, Ivor Costa, MD .  venlafaxine XR (EFFEXOR-XR) 24 hr capsule 37.5 mg, 37.5 mg, Oral, Daily, Ivor Costa, MD, 37.5 mg at 02/25/20 1306  Current Outpatient Medications:  .  acetaminophen (TYLENOL) 500 MG tablet, Take 500 mg by mouth 3 (three) times daily., Disp: , Rfl:  .  albuterol (VENTOLIN HFA) 108 (90 Base) MCG/ACT inhaler, Inhale 2 puffs into the lungs every 4 (four) hours as needed for wheezing., Disp: , Rfl:  .  Beclomethasone Dipropionate 80 MCG/ACT AERS, Place 2 sprays into the nose daily., Disp: , Rfl:  .  cholecalciferol (VITAMIN D) 25 MCG (1000 UNIT) tablet, Take 1,000 Units by mouth daily., Disp: , Rfl:  .  fluticasone (FLONASE) 50 MCG/ACT nasal spray, Place 2 sprays into both nostrils daily., Disp: , Rfl:  .  fluticasone furoate-vilanterol (BREO ELLIPTA) 100-25 MCG/INH AEPB, Inhale 1 puff into the lungs daily. , Disp: , Rfl:  .  loperamide (IMODIUM) 2 MG capsule, Take 2 mg by mouth as needed for diarrhea or loose stools., Disp: , Rfl:  .  loratadine (CLARITIN) 10 MG tablet, Take 10 mg by mouth daily., Disp: , Rfl:  .  ondansetron (ZOFRAN ODT) 4 MG disintegrating tablet, Take 1 tablet (4 mg total) by mouth every 8 (eight) hours as needed for nausea or vomiting., Disp: 8 tablet, Rfl: 0 .  venlafaxine XR (EFFEXOR-XR) 37.5 MG 24 hr capsule, Take 37.5-75  mg by mouth See admin instructions. Take 1 capsule (37.5mg ) by mouth every other day and take 2 capsules (75mg ) by mouth every alternate day, Disp: , Rfl:  .  levETIRAcetam (KEPPRA) 250 MG tablet, Take 1 tablet (250 mg total) by mouth 2 (two) times daily for 7 days., Disp: 14 tablet, Rfl: 0   Social History: Social History   Tobacco Use  . Smoking status: Former Research scientist (life sciences)  . Smokeless tobacco: Never Used  Substance Use Topics  . Alcohol use: Yes  . Drug use: No    Family Medical History: Family History  Problem Relation Age of Onset  . Stomach cancer Mother     Physical Examination: Vitals:   02/25/20 1730 02/25/20 1752  BP:    Pulse:  91  Resp: (!) 21 16  Temp:    SpO2:  97%     General: Patient is well developed, well nourished, calm, collected, and in no apparent distress.  Psychiatric: Patient is non-anxious.  Head:  Pupils equal, round, and reactive to light. She has trauma about L orbit.   ENT:  Oral mucosa appears well hydrated.  Neck:   Supple.  Full range of motion.  Respiratory: Patient is breathing without any difficulty.  Extremities: No edema.  Vascular: Palpable pulses in dorsal pedal vessels.  Skin:   On exposed skin, there are no abnormal skin lesions other than traumatic injuries  NEUROLOGICAL:  General: In no acute distress.   Awake, alert, oriented to person, place, and time.  Pupils equal round and reactive to light.  Facial tone is symmetric.  Tongue protrusion is midline.  There is no pronator drift.  ROM of spine: full.  Palpation of spine: nontender.    Strength: Side Biceps Triceps Deltoid Interossei Grip Wrist Ext. Wrist Flex.  R 5 5 5 5 5 5 5   L 5 5 5 5 5 5 5    Side Iliopsoas Quads Hamstring PF DF EHL  R 5 5 5 5 5 5   L 5 5 5 5 5 5    Reflexes are 1+ and symmetric at the biceps, triceps, brachioradialis, patella and achilles.   Bilateral upper and lower extremity sensation is intact to light touch and pin prick.  Clonus is not  present.  Toes are down-going.  Gait is untested - nonweightbearing due to L leg. Hoffman's absent.  Imaging: HCT 02/25/2020 IMPRESSION: 1. Unchanged subarachnoid hemorrhage along the sulci of the anterior right frontal convexity and parasagittal anterior left frontal lobe, similar to prior. 2. Slight interval increase in the attenuation of the foci along the septum pellucidum, may reflect mild blooming or technique related effect but without significant increase in size. 3. No new sites of hemorrhage. No mass effect or midline shift. 4. Left periorbital contusive changes, similar to prior. 5. Chronic microvascular angiopathy and moderate parenchymal volume loss.   Electronically Signed   By: Lovena Le  M.D.   On: 02/25/2020 02:30  I have personally reviewed the images and agree with the above interpretation.  Labs: CBC Latest Ref Rng & Units 02/24/2020 09/15/2015  WBC 4.0 - 10.5 K/uL 9.9 12.9(H)  Hemoglobin 12.0 - 15.0 g/dL 13.1 14.4  Hematocrit 36.0 - 46.0 % 38.9 43.7  Platelets 150 - 400 K/uL 204 187       Assessment and Plan: Ms. Morter is a pleasant 79 y.o. female with minor traumatic brain injury after a fall.  Stable on repeat HCT.  - when medically stable, discharge with a total 7 day course (including doses in hospital) of keppra 250 mg BID - we will follow up as outpatient with head CT in 4 weeks. - no surgical intervention required.   Abdimalik Mayorquin K. Izora Ribas MD, [place] Dept. of Neurosurgery

## 2020-02-25 NOTE — ED Notes (Signed)
Pt returned from MRI °

## 2020-02-25 NOTE — ED Notes (Signed)
Report given to Debbie, RN.

## 2020-02-25 NOTE — ED Provider Notes (Signed)
MRI demonstrates partial tear to the gluteus and vastus lateralis.  Patient is unable to ambulate with this.  CT head is stable.  I discussed with the hospitalist for admission   Lavonia Drafts, MD 02/25/20 [phone]

## 2020-02-26 DIAGNOSIS — M25552 Pain in left hip: Secondary | ICD-10-CM

## 2020-02-26 LAB — BASIC METABOLIC PANEL
Anion gap: 8 (ref 5–15)
BUN: 15 mg/dL (ref 8–23)
CO2: 28 mmol/L (ref 22–32)
Calcium: 8.8 mg/dL — ABNORMAL LOW (ref 8.9–10.3)
Chloride: 103 mmol/L (ref 98–111)
Creatinine, Ser: 0.99 mg/dL (ref 0.44–1.00)
GFR calc Af Amer: >60 mL/min (ref >60)
GFR calc non Af Amer: 54 mL/min — ABNORMAL LOW (ref >60)
Glucose, Bld: 106 mg/dL — ABNORMAL HIGH (ref 70–99)
Potassium: 4 mmol/L (ref 3.5–5.1)
Sodium: 139 mmol/L (ref 135–145)

## 2020-02-26 LAB — CBC
HCT: 35.9 % — ABNORMAL LOW (ref 36.0–46.0)
Hemoglobin: 12.2 g/dL (ref 12.0–15.0)
MCH: 31.8 pg (ref 26.0–34.0)
MCHC: 34 g/dL (ref 30.0–36.0)
MCV: 93.5 fL (ref 80.0–100.0)
Platelets: 182 10*3/uL (ref 150–400)
RBC: 3.84 MIL/uL — ABNORMAL LOW (ref 3.87–5.11)
RDW: 13.9 % (ref 11.5–15.5)
WBC: 9.5 10*3/uL (ref 4.0–10.5)
nRBC: 0 % (ref 0.0–0.2)

## 2020-02-26 MED ORDER — LIDOCAINE 5 % EX PTCH: 1.0000 | MEDICATED_PATCH | CUTANEOUS | 0 refills | Status: AC

## 2020-02-26 MED ORDER — LIDOCAINE 5 % EX PTCH
1.0000 | MEDICATED_PATCH | CUTANEOUS | Status: DC
  Administered 2020-02-26: 1 via TRANSDERMAL
  Filled 2020-02-26: qty 1

## 2020-02-26 MED ORDER — METHOCARBAMOL 500 MG PO TABS: 500.0000 mg | ORAL_TABLET | Freq: Three times a day (TID) | ORAL | 0 refills | Status: AC | PRN

## 2020-02-26 NOTE — TOC Transition Note (Signed)
Transition of Care [facility]) - CM/SW Discharge Note   Patient Details  Name: Savannah Strong MRN: [id number] Date of Birth: 1941-07-17  [facility] CM/SW Contact:  Shelbie Hutching, RN Phone Number: 02/26/2020, 11:33 AM   Clinical Narrative:    Patient is medically cleared to go home.  Patient agrees to home health PT with [facility].  Floydene Flock with [facility] notified of discharge.  Home Health agency will see patient on Monday- patient is good with this plan.  Patient has a rolling walker already at the bedside. Husband or son will transport patient home.    Final next level of care: Home/Self Care Barriers to Discharge: No Barriers Identified   Patient Goals and CMS Choice Patient states their goals for this hospitalization and ongoing recovery are:: To get better CMS Medicare.gov Compare Post Acute Care list provided to:: Patient Choice offered to / list presented to : Patient  Discharge Placement                       Discharge Plan and Services In-house Referral: Clinical Social Work   Post Acute Care Choice: Home Health          DME Arranged: [place]ford Rile rolling DME Agency: [facility]       HH Arranged: PT [place] Agency: [place] [facility] Date [place]: 02/26/20 Time [place]: [id number] Representative spoke with at [place]: [place] (SDOH) Interventions     Readmission Risk Interventions No flowsheet data found.

## 2020-02-26 NOTE — Discharge Summary (Signed)
[facility] [place] NAME: Savannah Strong    MR#:  [id number]  DATE OF BIRTH:  1941-10-19  DATE OF ADMISSION:  02/24/2020 ADMITTING PHYSICIAN: Ivor Costa, MD  DATE OF DISCHARGE: 02/26/2020  PRIMARY CARE PHYSICIAN: Ellender Hose, MD    ADMISSION DIAGNOSIS:  Fall [W19.XXXA] Essential hypertension [I10] Injury of head, initial encounter [id number] Fall, initial encounter [W19.XXXA] Acute hip pain, left [M25.552] Subarachnoid hemorrhage following injury, with loss of consciousness, initial encounter ([place]) [S06.6X9A]  DISCHARGE DIAGNOSIS:   Traumatic brain injury/SAH s/p mechanical fall left partial gluteus and Vastus lateralis muscle tear post fall SECONDARY DIAGNOSIS:   Past Medical History:  Diagnosis Date  . Cancer ([place])    Left breast with mast  . Hypertension   . IBS (irritable bowel syndrome)   . Migraines     HOSPITAL COURSE:  Savannah Strong is a 79 y.o. female with medical history significant of hypertension, left breast cancer (mastectomy), IBS, migraine headaches, who presents with a fall. Pt states that she fell when she lost her balance on a walking trail down a steep hill. She has bruise over her left face, left knee and both palms. She developed pain in left hip. She cannot not walk normally due to the pain. No unilateral numbness or tingling to extremities.  Fall: Patient had mechanical fall.  She has significant left hip pain, cannot walk normally.  MRI of her left hip is negative for bony fracture, but showed partial tears of the gluteus medius and vastus lateralis muscles. -PT commands home health PT. -As needed Tylenol PRN and Robaxin. Patient says pain is much better under control. -Lidocaine patch locally  SAH (subarachnoid hemorrhage) ([place]): Dr. Izora Ribas of neurosurgery is consulted.  The repeated CT scan of head showed stable SAH -Frequent neuro check--negative for any deficit -Keppra 250 mg twice daily for 1 week per  Dr. Izora Ribas of neurosurgeon's recommendation--already rxed by ER MD -f/u neurosx in 1 week  HTN:  Pt is not taking Bp meds at home -hydralazine prn  IBS (irritable bowel syndrome) -As needed Imodium    DVT ppx: SCD Code Status: partial code (OK for CPR, no intubation)--discussed by Dr Blaine Hamper Family Communication:  Yes, patient's  son at bed side and answered list of questions to my best  Disposition Plan:  Anticipate discharge back to previous home environment Consults called:  Dr. Cari Caraway of neurosurgery   Status is: Inpatient.  Patient has been in hospital since 5/5.  Patient still has significant pain in the left hip, cannot walk normally.--but getting better  Remains inpatient appropriate because:Inpatient level of care appropriate due to severity of illness   Dispo: The patient is from: home  Anticipated d/c is to: home today  Anticipated d/c date is may 7th  Patient currently is medically stable to d/c. Son and pt in agreement   CONSULTS OBTAINED:    DRUG ALLERGIES:   Allergies  Allergen Reactions  . Sulfa Antibiotics Rash    DISCHARGE MEDICATIONS:   Allergies as of 02/26/2020      Reactions   Sulfa Antibiotics Rash      Medication List    TAKE these medications   acetaminophen 500 MG tablet Commonly known as: TYLENOL Take 500 mg by mouth 3 (three) times daily.   albuterol 108 (90 Base) MCG/ACT inhaler Commonly known as: VENTOLIN HFA Inhale 2 puffs into the lungs every 4 (four) hours as needed for wheezing.   Beclomethasone Dipropionate 80 MCG/ACT Aers  Place 2 sprays into the nose daily.   Breo Ellipta 100-25 MCG/INH Aepb Generic drug: fluticasone furoate-vilanterol Inhale 1 puff into the lungs daily.   cholecalciferol 25 MCG (1000 UNIT) tablet Commonly known as: VITAMIN D Take 1,000 Units by mouth daily.   fluticasone 50 MCG/ACT nasal spray Commonly known as: FLONASE Place 2 sprays into both  nostrils daily.   levETIRAcetam 250 MG tablet Commonly known as: Keppra Take 1 tablet (250 mg total) by mouth 2 (two) times daily for 7 days.   lidocaine 5 % Commonly known as: LIDODERM Place 1 patch onto the skin daily. Remove & Discard patch within 12 hours or as directed by MD   loperamide 2 MG capsule Commonly known as: IMODIUM Take 2 mg by mouth as needed for diarrhea or loose stools.   loratadine 10 MG tablet Commonly known as: CLARITIN Take 10 mg by mouth daily.   methocarbamol 500 MG tablet Commonly known as: ROBAXIN Take 1 tablet (500 mg total) by mouth every 8 (eight) hours as needed for muscle spasms.   ondansetron 4 MG disintegrating tablet Commonly known as: Zofran ODT Take 1 tablet (4 mg total) by mouth every 8 (eight) hours as needed for nausea or vomiting.   venlafaxine XR 37.5 MG 24 hr capsule Commonly known as: EFFEXOR-XR Take 37.5-75 mg by mouth See admin instructions. Take 1 capsule (37.5mg ) by mouth every other day and take 2 capsules (75mg ) by mouth every alternate day       If you experience worsening of your admission symptoms, develop shortness of breath, life threatening emergency, suicidal or homicidal thoughts you must seek medical attention immediately by calling 911 or calling your MD immediately  if symptoms less severe.  You Must read complete instructions/literature along with all the possible adverse reactions/side effects for all the Medicines you take and that have been prescribed to you. Take any new Medicines after you have completely understood and accept all the possible adverse reactions/side effects.   Please note  You were cared for by a hospitalist during your hospital stay. If you have any questions about your discharge medications or the care you received while you were in the hospital after you are discharged, you can call the unit and asked to speak with the hospitalist on call if the hospitalist that took care of you is not  available. Once you are discharged, your primary care physician will handle any further medical issues. Please note that NO REFILLS for any discharge medications will be authorized once you are discharged, as it is imperative that you return to your primary care physician (or establish a relationship with a primary care physician if you do not have one) for your aftercare needs so that they can reassess your need for medications and monitor your lab values. Today   SUBJECTIVE   I feel better today. Some left hip pain. Eager to do PT Son at bedside  VITAL SIGNS:  Blood pressure 139/85, pulse 67, temperature 98.3 F (36.8 C), resp. rate 15, height 5\' 8"  (1.727 m), weight 81.6 kg, SpO2 97 %.  I/O:    Intake/Output Summary (Last 24 hours) at 02/26/2020 1201 Last data filed at 02/26/2020 1046 Gross per 24 hour  Intake 240 ml  Output --  Net 240 ml    PHYSICAL EXAMINATION:  GENERAL:  79 y.o.-year-old patient lying in the bed with no acute distress.  EYES: Pupils equal, round, reactive to light and accommodation. No scleral icterus.  HEENT: Head atraumatic, normocephalic.  Oropharynx and nasopharynx clear. Left facial  bruise NECK:  Supple, no jugular venous distention. No thyroid enlargement, no tenderness.  LUNGS: Normal breath sounds bilaterally, no wheezing, rales,rhonchi or crepitation. No use of accessory muscles of respiration.  CARDIOVASCULAR: S1, S2 normal. No murmurs, rubs, or gallops.  ABDOMEN: Soft, non-tender, non-distended. Bowel sounds present. No organomegaly or mass.  EXTREMITIES: No pedal edema, cyanosis, or clubbing. Left hip pain with movement. No focal deficit NEUROLOGIC: Cranial nerves II through XII are intact. Muscle strength 5/5 in all extremities. Sensation intact. Gait not checked.  PSYCHIATRIC: The patient is alert and oriented x 3.  SKIN: No obvious rash, lesion, or ulcer.   DATA REVIEW:   CBC  Recent Labs  Lab 02/26/20 0419  WBC 9.5  HGB 12.2  HCT 35.9*   PLT 182    Chemistries  Recent Labs  Lab 02/26/20 0419  NA 139  K 4.0  CL 103  CO2 28  GLUCOSE 106*  BUN 15  CREATININE 0.99  CALCIUM 8.8*    Microbiology Results   Recent Results (from the past 240 hour(s))  Respiratory Panel by RT PCR (Flu A&B, Covid) - Nasopharyngeal Swab     Status: None   Collection Time: 02/24/20  9:12 PM   Specimen: Nasopharyngeal Swab  Result Value Ref Range Status   SARS Coronavirus 2 by RT PCR NEGATIVE NEGATIVE Final    Comment: (NOTE) SARS-CoV-2 target nucleic acids are NOT DETECTED. The SARS-CoV-2 RNA is generally detectable in upper respiratoy specimens during the acute phase of infection. The lowest concentration of SARS-CoV-2 viral copies this assay can detect is 131 copies/mL. A negative result does not preclude SARS-Cov-2 infection and should not be used as the sole basis for treatment or other patient management decisions. A negative result may occur with  improper specimen collection/handling, submission of specimen other than nasopharyngeal swab, presence of viral mutation(s) within the areas targeted by this assay, and inadequate number of viral copies (<131 copies/mL). A negative result must be combined with clinical observations, patient history, and epidemiological information. The expected result is Negative. Fact Sheet for Patients:  PinkCheek.be Fact Sheet for Healthcare Providers:  GravelBags.it This test is not yet ap proved or cleared by the [place] FDA and  has been authorized for detection and/or diagnosis of SARS-CoV-2 by FDA under an Emergency Use Authorization (EUA). This EUA will remain  in effect (meaning this test can be used) for the duration of the COVID-19 declaration under Section 564(b)(1) of the Act, 21 U.S.C. section 360bbb-3(b)(1), unless the authorization is terminated or revoked sooner.    Influenza A by PCR NEGATIVE NEGATIVE Final    Influenza B by PCR NEGATIVE NEGATIVE Final    Comment: (NOTE) The Xpert Xpress SARS-CoV-2/FLU/RSV assay is intended as an aid in  the diagnosis of influenza from Nasopharyngeal swab specimens and  should not be used as a sole basis for treatment. Nasal washings and  aspirates are unacceptable for Xpert Xpress SARS-CoV-2/FLU/RSV  testing. Fact Sheet for Patients: PinkCheek.be Fact Sheet for Healthcare Providers: GravelBags.it This test is not yet approved or cleared by the [place] FDA and  has been authorized for detection and/or diagnosis of SARS-CoV-2 by  FDA under an Emergency Use Authorization (EUA). This EUA will remain  in effect (meaning this test can be used) for the duration of the  Covid-19 declaration under Section 564(b)(1) of the Act, 21  U.S.C. section 360bbb-3(b)(1), unless the authorization is  terminated or revoked. Performed at [facility]  [facility], [address] [place], [place] [address]     RADIOLOGY:  DG Chest 2 View  Result Date: 02/24/2020 CLINICAL DATA:  [place], rib pain EXAM: CHEST - 2 VIEW COMPARISON:  None. FINDINGS: Frontal and lateral views of the chest demonstrate an unremarkable cardiac silhouette. No airspace disease, effusion, or pneumothorax. Postsurgical changes from left mastectomy. On lateral view there is mild anterior wedging at the thoracolumbar junction, likely at the L1 vertebral body. Less than 10% loss of height anteriorly. This is consistent with age-indeterminate compression fracture. No other acute bony abnormalities. IMPRESSION: 1. Age indeterminate mild anterior wedge compression deformity at approximately the L1 level. 2. Otherwise no acute intrathoracic process. Electronically Signed   By: Randa Ngo M.D.   On: 02/24/2020 19:58   DG Wrist Complete Left  Result Date: 02/24/2020 CLINICAL DATA:  [place], abrasions EXAM: LEFT WRIST - COMPLETE 3+ VIEW COMPARISON:  None.  FINDINGS: Frontal, oblique, lateral, and ulnar deviated views of the left wrist are obtained. No fracture, subluxation, or dislocation. Mild osteoarthritis at the scaphoid trapezial joint. Soft tissues are unremarkable. IMPRESSION: 1. No acute displaced fracture. 2. Osteoarthritis. Electronically Signed   By: Randa Ngo M.D.   On: 02/24/2020 20:47   DG Wrist Complete Right  Result Date: 02/24/2020 CLINICAL DATA:  [place], abrasions EXAM: RIGHT WRIST - COMPLETE 3+ VIEW COMPARISON:  03/03/2019 FINDINGS: Frontal, oblique, lateral, and ulnar deviated views of the right wrist are obtained. No fracture, subluxation, or dislocation. Mild osteoarthritis within the radial aspect of the carpus. Soft tissues are unremarkable. IMPRESSION: 1. No acute displaced fracture. 2. Mild osteoarthritis. Electronically Signed   By: Randa Ngo M.D.   On: 02/24/2020 20:49   CT Head Wo Contrast  Result Date: 02/25/2020 CLINICAL DATA:  Follow-up intracranial hemorrhage EXAM: CT HEAD WITHOUT CONTRAST TECHNIQUE: Contiguous axial images were obtained from the base of the skull through the vertex without intravenous contrast. COMPARISON:  CT 02/24/2020 FINDINGS: Brain: Small volume subarachnoid hemorrhage again seen along the sulci in the anterior right right frontal convexity (2/7) and trace extra-axial hemorrhage in the parasagittal anterior left frontal lobe best seen on coronal imaging (4/14). Slight interval increase in the attenuation of the foci along the septum pellucidum, may reflect mild blooming or technique related effect but without significant increase in size again measuring up to 8 mm maximally. No new sites of hemorrhage. No CT evidence of acute large territory infarct. No mass effect or midline shift. Confluent areas of white matter hypoattenuation are most compatible with chronic microvascular angiopathy. Symmetric prominence of the ventricles, cisterns and sulci compatible with moderate parenchymal volume loss.  Vascular: Intracranial atherosclerosis. No hyperdense vessels. Skull: Left periorbital contusive changes, similar to prior. No expansile hematoma or acute osseous injury. Sinuses/Orbits: Paranasal sinuses and mastoid air cells are predominantly clear. Included orbital structures are unremarkable. Other: None. IMPRESSION: 1. Unchanged subarachnoid hemorrhage along the sulci of the anterior right frontal convexity and parasagittal anterior left frontal lobe, similar to prior. 2. Slight interval increase in the attenuation of the foci along the septum pellucidum, may reflect mild blooming or technique related effect but without significant increase in size. 3. No new sites of hemorrhage. No mass effect or midline shift. 4. Left periorbital contusive changes, similar to prior. 5. Chronic microvascular angiopathy and moderate parenchymal volume loss. Electronically Signed   By: Lovena Le M.D.   On: 02/25/2020 02:30   CT HEAD WO CONTRAST  Result Date: 02/24/2020 CLINICAL DATA:  Initial evaluation for acute trauma, fall.  EXAM: CT HEAD WITHOUT CONTRAST CT MAXILLOFACIAL WITHOUT CONTRAST CT CERVICAL SPINE WITHOUT CONTRAST TECHNIQUE: Multidetector CT imaging of the head, cervical spine, and maxillofacial structures were performed using the standard protocol without intravenous contrast. Multiplanar CT image reconstructions of the cervical spine and maxillofacial structures were also generated. COMPARISON:  None. FINDINGS: CT HEAD FINDINGS Brain: Generalized age-related cerebral atrophy. Small volume acute subarachnoid hemorrhage seen overlying the anterior right frontal convexity (series 3, image 10). Additional trace subarachnoid versus extra-axial hemorrhage overlying the parasagittal anterior left frontal convexity (series 3, image 20). Few small hyperdensities seen along the septum pellucidum consistent with small volume intraventricular hemorrhage, largest of which measures 8 mm (series 3, images 15, 18). No other  definite intracranial hemorrhage. No acute large vessel territory infarct. No mass lesion or midline shift. Mild ventricular prominence related to global parenchymal volume loss without hydrocephalus. No other extra-axial collection. Vascular: No hyperdense vessel. Skull: Left periorbital contusion.  Calvarium intact. Other: Mastoid air cells are clear. CT MAXILLOFACIAL FINDINGS Osseous: Zygomatic arches intact. No acute maxillary fracture. Pterygoid plates intact. Nasal bones intact. Nasal septum relatively midline and intact. No acute mandibular fracture. Mandibular condyles normally situated. No acute abnormality about the dentition. Orbits: Globes and orbital soft tissues within normal limits. Bony orbits intact. Sinuses: Paranasal sinuses are clear. Soft tissues: Acute left periorbital/facial soft tissue contusion. CT CERVICAL SPINE FINDINGS Alignment: Straightening of the normal cervical lordosis. No listhesis or malalignment. Skull base and vertebrae: Skull base intact. Normal C1-2 articulations are preserved in the dens is intact. Vertebral body heights maintained. No acute fracture. Soft tissues and spinal canal: Soft tissues of the neck demonstrate no acute finding. No abnormal prevertebral edema. Spinal canal within normal limits. Disc levels: Moderate degenerative spondylosis noted at C4-5 through C6-7 without significant stenosis. Upper chest: Visualized upper chest demonstrates no acute finding. Irregular pleuroparenchymal thickening noted at the right lung apex. Other: None. IMPRESSION: CT HEAD: 1. Patchy small volume acute posttraumatic subarachnoid and/or extra-axial hemorrhage overlying the right greater than left frontal convexities as above, with a few additional small foci of acute intraventricular hemorrhage. No associated mass effect, hydrocephalus, or other complication. 2. Age-related cerebral atrophy. No other acute intracranial abnormality. CT MAXILLOFACIAL: 1. Acute left  periorbital/facial soft tissue contusion. 2. No other acute maxillofacial injury.  No fracture. CT CERVICAL SPINE: 1. No acute traumatic injury within the cervical spine. 2. Moderate degenerative spondylosis at C4-5 through C6-7. Critical Value/emergent results were called by telephone at the time of interpretation on 02/24/2020 at 8:38 pm to provider [facility] , who verbally acknowledged these results. Electronically Signed   By: Jeannine Boga M.D.   On: 02/24/2020 20:39   CT Cervical Spine Wo Contrast  Result Date: 02/24/2020 CLINICAL DATA:  Initial evaluation for acute trauma, fall. EXAM: CT HEAD WITHOUT CONTRAST CT MAXILLOFACIAL WITHOUT CONTRAST CT CERVICAL SPINE WITHOUT CONTRAST TECHNIQUE: Multidetector CT imaging of the head, cervical spine, and maxillofacial structures were performed using the standard protocol without intravenous contrast. Multiplanar CT image reconstructions of the cervical spine and maxillofacial structures were also generated. COMPARISON:  None. FINDINGS: CT HEAD FINDINGS Brain: Generalized age-related cerebral atrophy. Small volume acute subarachnoid hemorrhage seen overlying the anterior right frontal convexity (series 3, image 10). Additional trace subarachnoid versus extra-axial hemorrhage overlying the parasagittal anterior left frontal convexity (series 3, image 20). Few small hyperdensities seen along the septum pellucidum consistent with small volume intraventricular hemorrhage, largest of which measures 8 mm (series 3, images 15, 18). No other definite intracranial hemorrhage.  No acute large vessel territory infarct. No mass lesion or midline shift. Mild ventricular prominence related to global parenchymal volume loss without hydrocephalus. No other extra-axial collection. Vascular: No hyperdense vessel. Skull: Left periorbital contusion.  Calvarium intact. Other: Mastoid air cells are clear. CT MAXILLOFACIAL FINDINGS Osseous: Zygomatic arches intact. No acute maxillary  fracture. Pterygoid plates intact. Nasal bones intact. Nasal septum relatively midline and intact. No acute mandibular fracture. Mandibular condyles normally situated. No acute abnormality about the dentition. Orbits: Globes and orbital soft tissues within normal limits. Bony orbits intact. Sinuses: Paranasal sinuses are clear. Soft tissues: Acute left periorbital/facial soft tissue contusion. CT CERVICAL SPINE FINDINGS Alignment: Straightening of the normal cervical lordosis. No listhesis or malalignment. Skull base and vertebrae: Skull base intact. Normal C1-2 articulations are preserved in the dens is intact. Vertebral body heights maintained. No acute fracture. Soft tissues and spinal canal: Soft tissues of the neck demonstrate no acute finding. No abnormal prevertebral edema. Spinal canal within normal limits. Disc levels: Moderate degenerative spondylosis noted at C4-5 through C6-7 without significant stenosis. Upper chest: Visualized upper chest demonstrates no acute finding. Irregular pleuroparenchymal thickening noted at the right lung apex. Other: None. IMPRESSION: CT HEAD: 1. Patchy small volume acute posttraumatic subarachnoid and/or extra-axial hemorrhage overlying the right greater than left frontal convexities as above, with a few additional small foci of acute intraventricular hemorrhage. No associated mass effect, hydrocephalus, or other complication. 2. Age-related cerebral atrophy. No other acute intracranial abnormality. CT MAXILLOFACIAL: 1. Acute left periorbital/facial soft tissue contusion. 2. No other acute maxillofacial injury.  No fracture. CT CERVICAL SPINE: 1. No acute traumatic injury within the cervical spine. 2. Moderate degenerative spondylosis at C4-5 through C6-7. Critical Value/emergent results were called by telephone at the time of interpretation on 02/24/2020 at 8:38 pm to provider [facility] , who verbally acknowledged these results. Electronically Signed   By: Jeannine Boga M.D.   On: 02/24/2020 20:39   MR HIP LEFT WO CONTRAST  Result Date: 02/25/2020 CLINICAL DATA:  [place]. Unable to bear weight. Negative x-rays. EXAM: MR OF THE LEFT HIP WITHOUT CONTRAST TECHNIQUE: Multiplanar, multisequence MR imaging was performed. No intravenous contrast was administered. COMPARISON:  Radiographs 02/24/2020 FINDINGS: Both hips are normally located. No acute hip fracture. No significant degenerative changes given the patient's age. No findings for avascular necrosis. The pubic symphysis and SI joints are intact. No pelvic fractures or bone lesions. No pubic rami fractures. Subcutaneous contusion noted over the left hip without large hematoma. There also partial tears of the gluteus medius muscle and also the upper aspect of the vastus lateralis muscle. No significant intrapelvic abnormalities are identified. IMPRESSION: 1. No acute hip fracture or pelvic fractures. 2. Partial tears of the gluteus medius and vastus lateralis muscles. 3. No significant intrapelvic abnormalities. Electronically Signed   By: Marijo Sanes M.D.   On: 02/25/2020 07:53   DG Hip Unilat With Pelvis 2-3 Views Left  Result Date: 02/24/2020 CLINICAL DATA:  [place], confusion, bilateral hip pain EXAM: DG HIP (WITH OR WITHOUT PELVIS) 2-3V LEFT; DG HIP (WITH OR WITHOUT PELVIS) 2-3V RIGHT COMPARISON:  None. FINDINGS: Frontal view of the pelvis as well as frontal and frogleg lateral views of both hips are obtained. Right hip: No acute displaced fracture. Mild osteoarthritis. Alignment is anatomic. Remainder of the visualized bony pelvis is unremarkable. Soft tissues are normal. Left hip: No acute displaced fracture. Alignment is anatomic. Mild osteoarthritis. Soft tissues are unremarkable. IMPRESSION: 1. Mild bilateral hip osteoarthritis. 2. No acute  hip fractures.  Unremarkable bony pelvis. Electronically Signed   By: Randa Ngo M.D.   On: 02/24/2020 19:56   DG Hip Unilat  With Pelvis 2-3 Views Right  Result  Date: 02/24/2020 CLINICAL DATA:  [place], confusion, bilateral hip pain EXAM: DG HIP (WITH OR WITHOUT PELVIS) 2-3V LEFT; DG HIP (WITH OR WITHOUT PELVIS) 2-3V RIGHT COMPARISON:  None. FINDINGS: Frontal view of the pelvis as well as frontal and frogleg lateral views of both hips are obtained. Right hip: No acute displaced fracture. Mild osteoarthritis. Alignment is anatomic. Remainder of the visualized bony pelvis is unremarkable. Soft tissues are normal. Left hip: No acute displaced fracture. Alignment is anatomic. Mild osteoarthritis. Soft tissues are unremarkable. IMPRESSION: 1. Mild bilateral hip osteoarthritis. 2. No acute hip fractures.  Unremarkable bony pelvis. Electronically Signed   By: Randa Ngo M.D.   On: 02/24/2020 19:56   DG Femur Min 2 Views Left  Result Date: 02/24/2020 CLINICAL DATA:  79 year old female with fall and left hip pain. EXAM: LEFT FEMUR 2 VIEWS COMPARISON:  Left hip radiograph dated 02/24/2020. FINDINGS: There is no acute fracture or dislocation. The bones are mildly osteopenic. Mild arthritic changes of the left knee. No joint effusion. The soft tissues are unremarkable. IMPRESSION: No acute fracture or dislocation. Electronically Signed   By: Anner Crete M.D.   On: 02/24/2020 23:22   CT Maxillofacial Wo Contrast  Result Date: 02/24/2020 CLINICAL DATA:  Initial evaluation for acute trauma, fall. EXAM: CT HEAD WITHOUT CONTRAST CT MAXILLOFACIAL WITHOUT CONTRAST CT CERVICAL SPINE WITHOUT CONTRAST TECHNIQUE: Multidetector CT imaging of the head, cervical spine, and maxillofacial structures were performed using the standard protocol without intravenous contrast. Multiplanar CT image reconstructions of the cervical spine and maxillofacial structures were also generated. COMPARISON:  None. FINDINGS: CT HEAD FINDINGS Brain: Generalized age-related cerebral atrophy. Small volume acute subarachnoid hemorrhage seen overlying the anterior right frontal convexity (series 3, image 10).  Additional trace subarachnoid versus extra-axial hemorrhage overlying the parasagittal anterior left frontal convexity (series 3, image 20). Few small hyperdensities seen along the septum pellucidum consistent with small volume intraventricular hemorrhage, largest of which measures 8 mm (series 3, images 15, 18). No other definite intracranial hemorrhage. No acute large vessel territory infarct. No mass lesion or midline shift. Mild ventricular prominence related to global parenchymal volume loss without hydrocephalus. No other extra-axial collection. Vascular: No hyperdense vessel. Skull: Left periorbital contusion.  Calvarium intact. Other: Mastoid air cells are clear. CT MAXILLOFACIAL FINDINGS Osseous: Zygomatic arches intact. No acute maxillary fracture. Pterygoid plates intact. Nasal bones intact. Nasal septum relatively midline and intact. No acute mandibular fracture. Mandibular condyles normally situated. No acute abnormality about the dentition. Orbits: Globes and orbital soft tissues within normal limits. Bony orbits intact. Sinuses: Paranasal sinuses are clear. Soft tissues: Acute left periorbital/facial soft tissue contusion. CT CERVICAL SPINE FINDINGS Alignment: Straightening of the normal cervical lordosis. No listhesis or malalignment. Skull base and vertebrae: Skull base intact. Normal C1-2 articulations are preserved in the dens is intact. Vertebral body heights maintained. No acute fracture. Soft tissues and spinal canal: Soft tissues of the neck demonstrate no acute finding. No abnormal prevertebral edema. Spinal canal within normal limits. Disc levels: Moderate degenerative spondylosis noted at C4-5 through C6-7 without significant stenosis. Upper chest: Visualized upper chest demonstrates no acute finding. Irregular pleuroparenchymal thickening noted at the right lung apex. Other: None. IMPRESSION: CT HEAD: 1. Patchy small volume acute posttraumatic subarachnoid and/or extra-axial hemorrhage  overlying the right greater than left frontal convexities  as above, with a few additional small foci of acute intraventricular hemorrhage. No associated mass effect, hydrocephalus, or other complication. 2. Age-related cerebral atrophy. No other acute intracranial abnormality. CT MAXILLOFACIAL: 1. Acute left periorbital/facial soft tissue contusion. 2. No other acute maxillofacial injury.  No fracture. CT CERVICAL SPINE: 1. No acute traumatic injury within the cervical spine. 2. Moderate degenerative spondylosis at C4-5 through C6-7. Critical Value/emergent results were called by telephone at the time of interpretation on 02/24/2020 at 8:38 pm to provider [facility] , who verbally acknowledged these results. Electronically Signed   By: Jeannine Boga M.D.   On: 02/24/2020 20:39     CODE STATUS:     Code Status Orders  (From admission, onward)         Start     Ordered   02/25/20 1013  Limited resuscitation (code)  Continuous    Question Answer Comment  In the event of cardiac or respiratory ARREST: Initiate Code Blue, Call Rapid Response Yes   In the event of cardiac or respiratory ARREST: Perform CPR Yes   In the event of cardiac or respiratory ARREST: Perform Intubation/Mechanical Ventilation No   In the event of cardiac or respiratory ARREST: Use NIPPV/BiPAp only if indicated Yes   In the event of cardiac or respiratory ARREST: Administer ACLS medications if indicated Yes   In the event of cardiac or respiratory ARREST: Perform Defibrillation or Cardioversion if indicated Yes      02/25/20 1012        Code Status History    This patient has a current code status but no historical code status.   Advance Care Planning Activity    Advance Directive Documentation     Most Recent Value  Type of Advance Directive  Healthcare Power of Attorney, Living will  Pre-existing out of facility DNR order (yellow form or pink MOST form)  --  "MOST" Form in Place?  --       TOTAL TIME  TAKING CARE OF THIS PATIENT: **35* minutes.    Fritzi Mandes M.D  [facility]    CC: Primary care physician; Ellender Hose, MD

## 2020-02-26 NOTE — Progress Notes (Signed)
Physical Therapy Treatment Patient Details Name: Savannah Strong MRN: [id number] DOB: 1941/07/15 Today's Date: 02/26/2020    History of Present Illness 79 y/o female who lost balance while hiking in the woods, hit head.  Imaging reveals L glute/quad partial tears, small subarachnoid hemorrhage.    PT Comments    Pt continues with some mild confusion but was able to participate well with PT and showed little to no limp on L during ambulation, appropriate walker use (did adjust walker to appropriate height and educated pt/family on usage).  Pt was able to get in/out of bed w/o assist and w/o any overt increase in pain.  Educated pt/son/husband on likely course of recovery, basic involved anatomy and implications, appropriate progression with activity/HHPT and answered all questions.     Follow Up Recommendations  Home health PT     Equipment Recommendations  Rolling walker with 5" wheels    Recommendations for Other Services       Precautions / Restrictions Precautions Precautions: Fall Restrictions Weight Bearing Restrictions: No    Mobility  Bed Mobility Overal bed mobility: Modified Independent             General bed mobility comments: Pt was able to shift LEs in/out of bed w/o UE support  Transfers Overall transfer level: Needs assistance Equipment used: Rolling walker (2 wheeled) Transfers: Sit to/from Stand Sit to Stand: Min guard         General transfer comment: Pt did need repeat reminders to use UEs appropriately  Ambulation/Gait Ambulation/Gait assistance: Min guard Gait Distance (Feet): 85 Feet Assistive device: Rolling walker (2 wheeled)       General Gait Details: Pt has a few initial limping steps, but with cuing to use walker appropriate was able to maintain consistent cadence, speed, etc and did not show limp or favoring on the L once she got going.     Stairs Stairs: (educated family again about proper strategy and rail use)            Wheelchair Mobility    Modified Rankin (Stroke Patients Only)       Balance Overall balance assessment: Modified Independent             Standing balance comment: Pt was able to maintain standing w/o AD, but PT did encourage AD use any time she is Immunologist Arousal/Alertness: Awake/alert Behavior During Therapy: WFL for tasks assessed/performed Overall Cognitive Status: Impaired/Different from baseline Area of Impairment: Memory;Problem solving                               General Comments: pt needing repeat cuing with most education, did show some confusion/delay that seems new since fall/head injury, mild difficulty with sequencing novel tasks requiring verbal cues; pt/spouse endorse no prior cognitive deficits prior to fall      Exercises      General Comments        Pertinent Vitals/Pain Pain Assessment: Faces Faces Pain Scale: Hurts a little bit Pain Location: Pt report some continued soreness, but was not pain limited iwth any activity    Home Living                      Prior Function  PT Goals (current goals can now be found in the care plan section) Progress towards PT goals: Progressing toward goals    Frequency    Min 2X/week      PT Plan Current plan remains appropriate    Co-evaluation              AM-PAC PT "6 Clicks" Mobility   Outcome Measure  Help needed turning from your back to your side while in a flat bed without using bedrails?: None Help needed moving from lying on your back to sitting on the side of a flat bed without using bedrails?: None Help needed moving to and from a bed to a chair (including a wheelchair)?: None Help needed standing up from a chair using your arms (e.g., wheelchair or bedside chair)?: None Help needed to walk in hospital room?: None Help needed climbing 3-5 steps with a railing? : A Little 6 Click Score: 23     End of Session Equipment Utilized During Treatment: Gait belt Activity Tolerance: Patient tolerated treatment well Patient left: with bed alarm set;with call bell/phone within reach;with family/visitor present Nurse Communication: Mobility status PT Visit Diagnosis: Muscle weakness (generalized) (M62.81);Difficulty in walking, not elsewhere classified (R26.2);Pain Pain - Right/Left: Left Pain - part of body: Hip     Time: 1225-1253 PT Time Calculation (min) (ACUTE ONLY): 28 min  Charges:  $Gait Training: 8-22 mins $Therapeutic Activity: 8-22 mins                     Kreg Shropshire, DPT 02/26/2020, 1:46 PM

## 2020-03-01 ENCOUNTER — Other Ambulatory Visit: Payer: Self-pay | Admitting: Neurosurgery

## 2020-03-01 DIAGNOSIS — I609 Nontraumatic subarachnoid hemorrhage, unspecified: Secondary | ICD-10-CM

## 2020-03-07 DIAGNOSIS — M25552 Pain in left hip: Secondary | ICD-10-CM

## 2020-03-22 ENCOUNTER — Other Ambulatory Visit: Payer: Self-pay

## 2020-03-22 ENCOUNTER — Ambulatory Visit
Admission: RE | Admit: 2020-03-22 | Discharge: 2020-03-22 | Disposition: A | Discharge status: Home / Self Care | Payer: Medicare Other | Source: Ambulatory Visit | Attending: Neurosurgery | Admitting: Neurosurgery

## 2020-03-22 DIAGNOSIS — I609 Nontraumatic subarachnoid hemorrhage, unspecified: Secondary | ICD-10-CM

## 2020-05-23 ENCOUNTER — Encounter: Payer: Self-pay | Admitting: Physical Therapy

## 2020-05-23 ENCOUNTER — Other Ambulatory Visit: Payer: Self-pay

## 2020-05-23 ENCOUNTER — Ambulatory Visit: Payer: Medicare Other | Attending: Internal Medicine | Admitting: Physical Therapy

## 2020-05-23 DIAGNOSIS — R262 Difficulty in walking, not elsewhere classified: Secondary | ICD-10-CM | POA: Diagnosis present

## 2020-05-23 DIAGNOSIS — M5416 Radiculopathy, lumbar region: Secondary | ICD-10-CM | POA: Diagnosis present

## 2020-05-23 DIAGNOSIS — M25552 Pain in left hip: Secondary | ICD-10-CM | POA: Insufficient documentation

## 2020-05-23 NOTE — Patient Instructions (Signed)
Access Code: [email or web address] URL: [email or web address] Date: 05/23/2020 Prepared by: Bernita Raisin  Exercises Seated Slump Nerve Glide - 3 x daily - 7 x weekly - 2 sets - 10 reps - 5 hold Prone Press Up - 2 x daily - 7 x weekly - 2 sets - 10 reps - 5 hold Right Standing Lateral Shift Correction at [place] - 2 x daily - 7 x weekly - 1 sets - 10 reps - 5 hold Standing Lumbar Extension - 2 x daily - 7 x weekly - 2 sets - 10 reps - 5 hold

## 2020-05-23 NOTE — Therapy (Signed)
[place] [facility] SERVICES [address] [place], [place], [address] Phone: [phone]   Fax:  [phone]  Physical Therapy Evaluation  Patient Details  Name: Savannah Strong MRN: [id number] Date of Birth: 1941/07/19 Referring Provider (PT): Ellender Hose, MD   Encounter Date: 05/23/2020   PT End of Session - 05/23/20 1150    Visit Number 1    Number of Visits 17    Date for PT Re-Evaluation 07/18/20    Authorization Type Eval: 08/02    PT Start Time 7371    PT Stop Time 1140    PT Time Calculation (min) 65 min    Activity Tolerance Patient tolerated treatment well;No increased pain           Past Medical History:  Diagnosis Date   Cancer ([place])    Left breast with mast   Hypertension    IBS (irritable bowel syndrome)    Migraines     Past Surgical History:  Procedure Laterality Date   masectomy     TONSILLECTOMY      There were no vitals filed for this visit.    Subjective Assessment - 05/23/20 1550    Subjective Lumbar spine radiculopathy    Pertinent History On May 5 patient was walking with her husband on a trail outside their home when she fell on the left side of her body and hit her head. Said she was going down a hill and could not stop and then fell. CT scan showed a small subarachnoid hemorrhage. She was followed expectantly with a follow-up CAT scan on June 5 that read showed resolution of the hemorrhage. She has pain in the lumbar spine and hip that travels down the leg. She reports that after she walks, her leg feels heavy like concrete with a burning sensation down the leg.    Limitations Walking    How long can you walk comfortably? 1/2 mile    Patient Stated Goals Walk without pain    Currently in Pain? No/denies              [facility] PT Assessment - 05/23/20 1227      Assessment   Medical Diagnosis L Knee osteoarthritis L Sciatica    Referring Provider (PT) Ellender Hose, MD    Onset Date/Surgical  Date 02/24/20   She fell onto her L side and tore muscles in L hip.   Hand Dominance Right    Prior Therapy Yes, home health after fall      Precautions   Precautions None      Restrictions   Weight Bearing Restrictions No      Balance Screen   Has the patient fallen in the past 6 months Yes    How many times? 2    Has the patient had a decrease in activity level because of a fear of falling?  Yes    Is the patient reluctant to leave their home because of a fear of falling?  No      Home Environment   Living Environment Private residence    Living Arrangements Spouse/significant other    [place] to enter    Entrance Stairs-Number of Steps 4    Entrance Stairs-Rails Can reach both    [place] One level      Cognition   Overall Cognitive Status Within Functional Limits for tasks assessed      Observation/Other Assessments   Focus on Therapeutic Outcomes (FOTO)  55      ROM / Strength   AROM / PROM / Strength AROM;Strength      Special Tests    Special Tests Lumbar;Hip Special Tests    Hip Special Tests  Saralyn Pilar [facility]) Test      Saralyn Pilar [facility]) Test   Findings Positive    Side Left             Objective measurements completed on examination: See above findings.      SUBJECTIVE Chief complaint: On May 5 patient was walking with her husband on a trail outside their home when she fell on the left side of her body and hit her head. Said she was going down a hill and could not stop and then fell. CT scan showed a small subarachnoid hemorrhage. She was followed expectantly with a follow-up CAT scan on June 5 that read showed resolution of the hemorrhage. She has pain in the lumbar spine and hip that travels down the leg. She reports that after she walks, her leg feels heavy like concrete with a burning sensation down the leg.  Pain: 0/10 Present, 0/10 Best, 7/10 Worst, at the end of the walk Aggravating factors: Walking Easing factors: Sitting down,  takes 2 tylenol at night, none during the day 24 hour pain behavior: Able to sleep through the night. How long can you walk: Half of a mile, was walking 1/4 of a mile but has increased to 1/2 mile. Recent back trauma: No Prior history of back injury or pain: No Pain quality: unable to describe. Leg feels heavy and burning after a walk, but not able to finds words to describe hip/back pain. Radiating pain: Yes  Numbness/Tingling: Yes; burning sensation Dominant hand: right Imaging: Yes, on 05/06: partial tears of the gluteus medius and vastus lateralis muscles, no acute hip fracture or pelvic fx, no significant intrapelvic abnormalities  Goals: Walk without pain Red flags (bowel/bladder changes, saddle paresthesia, personal history of cancer, chills/fever, night sweats, unrelenting pain, first onset of insidious LBP <20 y/o) Negative    OBJECTIVE  Mental Status Patient is oriented to person, place and time.  Recent memory is intact.  Remote memory is intact.  Attention span and concentration are intact.  Expressive speech is intact.  Patient's fund of knowledge is within normal limits for educational level.  SENSATION: Grossly intact to light touch bilateral LEs as determined by testing dermatomes L2-S2 Proprioception and hot/cold testing deferred on this date   MUSCULOSKELETAL: Tremor: None Bulk: Normal Tone: Normal No visible step-off along spinal column  Posture Lumbar lordosis: WNL Iliac crest height: left lower than right Lumbar lateral shift: positive, to the L  Gait Leans to the L when walking due to lateral shift, does not use a cane, but doctor recommended looking into a 4 point cane.   Palpation L1-L5 hypomobile TTP on L gluteus medius, L paraspinals (L2-4)  Strength (out of 5) (performed in sitting) R/L 4+/4 Hip flexion 4+/3* Hip ER 4+/3+ Hip IR 5/4-* Knee extension 5/4 Knee flexion 5/4 Ankle dorsiflexion 5/4+ Ankle plantarflexion  *Indicates pain    AROM (degrees) R/L Lumbar forward flexion: 15 Lumbar extension: 0, felt better than flexion Lumbar lateral flexion: R: 10;  L: 10 Hip IR/ER: WNL   Repeated Movements No centralization or peripheralization of symptoms with repeated lumbar extension or flexion.  Lumbar extension felt better than flexion.     Passive Accessory Intervertebral Motion (PAIVM) Pt denies reproduction of back pain with CPA L1-L5 and UPA bilaterally  L1-L5. Felt better with repeated PAMs. Generally hypomobile throughout, more hypomobile at L3-5.    SPECIAL TESTS Lumbar Radiculopathy and Discogenic: Slump (SN 83, -LR 0.32): R: Negative L: Positive SLR (SN 92, -LR 0.29): R: Negative L: Positive   Hip: FABER (SN 81): R: Negative L: Negative FADIR (SN 94): R: Negative L: Negative Hip scour (SN 50): R: Negative L: Positive   ASSESSMENT Clinical Impression: Pt is a pleasant 79 year-old female referred for low back and L hip pain. She presents with a left lateral shift that improved by the end of the session. Sitting and tylenol help to ease her symptoms, while walking aggravates it. Patient able to walk 1/2 mile before pain is too much. Patient scored a 78 on the FOTO. PT examination reveals deficits in all lumbar ROM and strength in L LE. Patient notes that lumbar extension felt better than flexion. Patient felt the slump stretch in her quads, hip, and back. Patient is hypomobile in L1-5, with repeated PAMs felt good. Patient is tender to palpation in left  L2-5 paraspinals and left gluteal hip muscles. HEP given and performed with patient. Patient educated to look for symptoms to centralize and to discontinue certain ones if symptoms get worse. Pt presents with deficits in strength, mobility, range of motion, and pain. Pt will benefit from skilled PT services to address deficits and return to pain-free function at home.    Plan - 05/23/20 1257    Clinical Impression Statement Pt is a pleasant 79 year-old female  referred for low back and L hip pain. She presents with a left lateral shift that improved by the end of the session. Sitting and tylenol help to ease her symptoms, while walking aggravates it. Patient able to walk 1/2 mile before pain is too much. Patient scored a 55 on the FOTO. PT examination reveals deficits in all lumbar ROM and strength in L LE. Patient notes that lumbar extension felt better than flexion. Patient felt the slump stretch in her quads, hip, and back. Patient is hypomobile in L1-5, with repeated PAMs felt good. Patient is tender to palpation in left  L2-5 paraspinals and left gluteal hip muscles. HEP given and performed with patient. Patient educated to look for symptoms to centralize and to discontinue certain ones if symptoms get worse. Pt presents with deficits in strength, mobility, range of motion, and pain. Pt will benefit from skilled PT services to address deficits and return to pain-free function at home.    Personal Factors and Comorbidities Age;Comorbidity 3+    Comorbidities Hypertension, IBS, anxiety    Examination-Activity Limitations Locomotion Level    Examination-Participation Restrictions Community Activity    Stability/Clinical Decision Making Evolving/Moderate complexity    Clinical Decision Making Moderate    Rehab Potential Good    PT Frequency 2x / week    PT Duration 8 weeks    PT Treatment/Interventions ADLs/Self Care Home Management;Canalith Repostioning;Electrical Stimulation;Gait training;Stair training;Functional mobility training;Therapeutic activities;Therapeutic exercise;Balance training;Neuromuscular re-education;Patient/family education;Manual techniques;Passive range of motion;Dry needling;Visual/perceptual remediation/compensation;Spinal Manipulations;Joint Manipulations;Vestibular;Aquatic Therapy    PT Next Visit Plan Review HEP    PT Home Exercise Plan Medbridge: [email or web address]    Consulted and Agree with Plan of Care Patient           Patient  will benefit from skilled therapeutic intervention in order to improve the following deficits and impairments:  Abnormal gait, Decreased activity tolerance, Decreased strength, Difficulty walking, Hypomobility, Dizziness  Visit Diagnosis: Pain in left hip  Radiculopathy, lumbar region  Problem List Patient Active Problem List   Diagnosis Date Noted   Acute hip pain, left    Hypertension    [facility] (subarachnoid hemorrhage) (HCC)    IBS (irritable bowel syndrome)     Noemi Chapel, SPT Trotter,Margaret 05/24/2020, 9:11 AM  [place] [facility] SERVICES [address] [place], [place], [address] Phone: [phone]   Fax:  [phone]  Name: ROSAURA BOLON MRN: [id number] Date of Birth: Mar 17, 1941

## 2020-05-26 ENCOUNTER — Encounter: Payer: Self-pay | Admitting: Physical Therapy

## 2020-05-26 ENCOUNTER — Other Ambulatory Visit: Payer: Self-pay

## 2020-05-26 ENCOUNTER — Ambulatory Visit: Payer: Medicare Other | Admitting: Physical Therapy

## 2020-05-26 DIAGNOSIS — M25552 Pain in left hip: Secondary | ICD-10-CM

## 2020-05-26 DIAGNOSIS — R262 Difficulty in walking, not elsewhere classified: Secondary | ICD-10-CM

## 2020-05-26 DIAGNOSIS — M5416 Radiculopathy, lumbar region: Secondary | ICD-10-CM

## 2020-05-26 NOTE — Therapy (Signed)
[place] MAIN [facility] SERVICES [address] [place], [place], [address] Phone: [phone]   Fax:  [phone]  Physical Therapy Treatment  Patient Details  Name: Savannah Strong MRN: [id number] Date of Birth: [birth year] Referring Provider (PT): Ellender Hose, MD   Encounter Date: 05/26/2020   PT End of Session - 05/26/20 1039    Visit Number 2    Number of Visits 17    Date for PT Re-Evaluation 07/18/20    Authorization Type Eval: 08/02    PT Start Time 1020    PT Stop Time 1100    PT Time Calculation (min) 40 min    Activity Tolerance Patient tolerated treatment well;No increased pain           Past Medical History:  Diagnosis Date   Cancer ([place])    Left breast with mast   Hypertension    IBS (irritable bowel syndrome)    Migraines     Past Surgical History:  Procedure Laterality Date   masectomy     TONSILLECTOMY      There were no vitals filed for this visit.   Subjective Assessment - 05/26/20 1036    Subjective Patient reports increased low back pain today. She reports not doing her HEP this week as she forgot about it. She reports still feeling heaviness in LLE especially when walking long distances;    Pertinent History On May 5 patient was walking with her husband on a trail outside their home when she fell on the left side of her body and hit her head. Said she was going down a hill and could not stop and then fell. CT scan showed a small subarachnoid hemorrhage. She was followed expectantly with a follow-up CAT scan on June 5 that read showed resolution of the hemorrhage. She has pain in the lumbar spine and hip that travels down the leg. She reports that after she walks, her leg feels heavy like concrete with a burning sensation down the leg.    Limitations Walking    How long can you walk comfortably? 1/2 mile    Patient Stated Goals Walk without pain    Currently in Pain? Yes    Pain Score 4     Pain Location  Back    Pain Orientation Left    Pain Descriptors / Indicators Aching;Sore    Pain Type Chronic pain    Pain Onset More than a month ago    Pain Frequency Intermittent    Aggravating Factors  worse with walking long distances, standing    Pain Relieving Factors rest/heat    Effect of Pain on Daily Activities decreased activity tolerance;    Multiple Pain Sites No             TREATMENT: Patient prone with moist heat to low back x5 min;  Manual therapy: PT performed grade II-III PA mobs at L1, L2, L3, L4, L5, S1 20 se bouts x3 sets each; PT performed gentle lumbosacral distraction 20 sec hold, 10 sec rest x3 min; Patient able to exhibit better joint mobility following manual therapy. She also reports less discomfort in lumbar spine. Patient also reports less radicular symptoms following manual therapy;   PT instructed patient in lumbar extension exercise: Prone press ups x10 reps with cues to relax LE gluteal squeeze and focus on UE lifting;  Progressed to prone press ups with therapist overpressure at L5-S1 for increased lumbar extension x10 reps;   Patient able to  exhibit good lumbar extension AROM. She denies any pain with exercise;  Patient transitioned to sitting edge of mat table; Had patient stand and walk x200 feet; Initially pt exhibits erect posture and denies any pain however with increased repetition she does exhibit increased left lateral shift Instructed patient in standing right side bending with patient hand on hip with overpressure for increased lateral flexion ROM; Patient able to complete with better postural awareness and reports lessened back discomfort  She reports minimal back/LE pain at end of session. Reinforced HEP with instruction to perform lumbar extension at least every hour for better pain management; Pt verbalized understanding;                      PT Education - 05/26/20 1038    Education Details lumbar ROM/manual therapy, HEP     Person(s) Educated Patient    Methods Explanation;Verbal cues    Comprehension Verbalized understanding;Returned demonstration;Verbal cues required;Need further instruction            PT Short Term Goals - 05/23/20 1237      PT SHORT TERM GOAL #1   Title Pt will be independent with HEP in order to improve strength and decrease back pain in order to improve pain-free function at home.    Time 4    Period Weeks    Status New    Target Date 06/21/20             PT Long Term Goals - 05/23/20 1237      PT LONG TERM GOAL #1   Title Pt will decrease worst back and hip pain as reported on NPRS by at least 2 points in order to demonstrate clinically significant reduction in back pain.    Baseline 08/02: 7/10    Time 8    Period Weeks    Status New    Target Date 07/18/20      PT LONG TERM GOAL #2   Title Pt will increase strength of by at least 1/2 MMT grade in order to demonstrate improvement in strength and function.    Baseline 8/02: see eval    Time 8    Period Weeks    Target Date 07/18/20      PT LONG TERM GOAL #3   Title Patient will increase FOTO score to equal to or greater than 64% to demonstrate statistically significant improvement in mobility and quality of life.    Baseline 08/02: 55    Time 8    Status New    Target Date 07/18/20                 Plan - 05/26/20 1140    Clinical Impression Statement Patient motivated and participated well within session. She tolerated prone cental PA mobilizations well reporting less discomfort in lumbar spine. Patient was also able to exhibit better lumbar extension AROM with prone press-ups. Upon standing patient exhibits better postural awareness with less left lateral shift. She reports less pain down LLE upon standing and walking short distance. Reinforced importance of HEP adherence for better pain management and to improve mobility. Patient verbalized understanding;    Personal Factors and Comorbidities  Age;Comorbidity 3+    Comorbidities Hypertension, IBS, anxiety    Examination-Activity Limitations Locomotion Level    Examination-Participation Restrictions Community Activity    Stability/Clinical Decision Making Evolving/Moderate complexity    Rehab Potential Good    PT Frequency 2x / week    PT Duration 8  weeks    PT Treatment/Interventions ADLs/Self Care Home Management;Canalith Repostioning;Electrical Stimulation;Gait training;Stair training;Functional mobility training;Therapeutic activities;Therapeutic exercise;Balance training;Neuromuscular re-education;Patient/family education;Manual techniques;Passive range of motion;Dry needling;Visual/perceptual remediation/compensation;Spinal Manipulations;Joint Manipulations;Vestibular;Aquatic Therapy    PT Next Visit Plan Review HEP    PT Home Exercise Plan Medbridge: [email or web address]    Consulted and Agree with Plan of Care Patient           Patient will benefit from skilled therapeutic intervention in order to improve the following deficits and impairments:  Abnormal gait, Decreased activity tolerance, Decreased strength, Difficulty walking, Hypomobility, Dizziness  Visit Diagnosis: Pain in left hip  Radiculopathy, lumbar region  Difficulty in walking, not elsewhere classified     Problem List Patient Active Problem List   Diagnosis Date Noted   Acute hip pain, left    Hypertension    Fall    SAH (subarachnoid hemorrhage) ([facility])    IBS (irritable bowel syndrome)     Willia Genrich PT, DPT 05/26/2020, 11:42 AM  [place] [address] [place], [place], [address] Phone: [phone]   Fax:  [phone]  Name: ZANETTA DEHAAN MRN: [id number] Date of Birth: Nov 11, 1940

## 2020-05-30 ENCOUNTER — Other Ambulatory Visit: Payer: Self-pay

## 2020-05-30 ENCOUNTER — Encounter: Payer: Self-pay | Admitting: Physical Therapy

## 2020-05-30 ENCOUNTER — Ambulatory Visit: Payer: Medicare Other | Admitting: Physical Therapy

## 2020-05-30 DIAGNOSIS — M5416 Radiculopathy, lumbar region: Secondary | ICD-10-CM

## 2020-05-30 DIAGNOSIS — M25552 Pain in left hip: Secondary | ICD-10-CM

## 2020-05-30 DIAGNOSIS — R262 Difficulty in walking, not elsewhere classified: Secondary | ICD-10-CM

## 2020-05-30 NOTE — Therapy (Signed)
[place] MAIN [facility] SERVICES [address] [place], [place], [address] Phone: [phone]   Fax:  [phone]  Physical Therapy Treatment  Patient Details  Name: Savannah Strong MRN: [id number] Date of Birth: August 30, 1941 Referring Provider (PT): Ellender Hose, MD   Encounter Date: 05/30/2020   PT End of Session - 05/30/20 1044    Visit Number 3    Number of Visits 17    Date for PT Re-Evaluation 07/18/20    Authorization Type Eval: 08/02    PT Start Time 1100    PT Stop Time 1140    PT Time Calculation (min) 40 min    Activity Tolerance Patient tolerated treatment well;No increased pain    Behavior During Therapy WFL for tasks assessed/performed           Past Medical History:  Diagnosis Date  . Cancer ([place])    Left breast with mast  . Hypertension   . IBS (irritable bowel syndrome)   . Migraines     Past Surgical History:  Procedure Laterality Date  . masectomy    . TONSILLECTOMY      There were no vitals filed for this visit.   Subjective Assessment - 05/30/20 1110    Subjective Patient reports that her L hip is aggravated today. She reports doing her HEP . She continues to report LLE feeling heavy with walking.    Pertinent History On May 5 patient was walking with her husband on a trail outside their home when she fell on the left side of her body and hit her head. Said she was going down a hill and could not stop and then fell. CT scan showed a small subarachnoid hemorrhage. She was followed expectantly with a follow-up CAT scan on June 5 that read showed resolution of the hemorrhage. She has pain in the lumbar spine and hip that travels down the leg. She reports that after she walks, her leg feels heavy like concrete with a burning sensation down the leg.    Limitations Walking    How long can you walk comfortably? 1/2 mile    Patient Stated Goals Walk without pain    Currently in Pain? Yes    Pain Score 4     Pain Location  Hip    Pain Orientation Left    Pain Descriptors / Indicators Aching    Pain Type Chronic pain    Pain Onset More than a month ago    Pain Frequency Intermittent    Aggravating Factors  increased heaviness after walking intermediate and long distances    Pain Relieving Factors rest  and heat    Effect of Pain on Daily Activities slower to complete tasks at home           TREATMENT: Patient prone with moist heat to low back x 5 min  Manual therapy: PT performed grade II-III PA mobs at L1,-S1 30 bouts x 3 sets each; PT performed gentle lumbosacral distraction 20 sec hold, 10 sec rest x3 min  She also reports less discomfort in lumbar spine. Patient also reports less radicular symptoms following manual therapy;   Therapeutic exercise: Prone press ups x 10 reps  X 2 sets with cues to focus on UE lifting and keeping her motion and not stopping at the end of the press up.  Prone press ups with therapist overpressure at L5-S1 for increased lumbar extension x10 reps x 2 sets   She is able to have  increased trunk extension , with minimal pain in lower back She reports that her arms got tired     She reports no back/LE pain at end of session. Reinforced HEP with instruction to perform lumbar extension in standing at least every hour for better pain management; Pt verbalized understanding;                          PT Education - 05/30/20 1043    Education Details HEP    Person(s) Educated Patient    Methods Explanation    Comprehension Verbal cues required;Tactile cues required;Need further instruction            PT Short Term Goals - 05/23/20 1237      PT SHORT TERM GOAL #1   Title Pt will be independent with HEP in order to improve strength and decrease back pain in order to improve pain-free function at home.    Time 4    Period Weeks    Status New    Target Date 06/21/20             PT Long Term Goals - 05/23/20 1237      PT LONG TERM GOAL  #1   Title Pt will decrease worst back and hip pain as reported on NPRS by at least 2 points in order to demonstrate clinically significant reduction in back pain.    Baseline 08/02: 7/10    Time 8    Period Weeks    Status New    Target Date 07/18/20      PT LONG TERM GOAL #2   Title Pt will increase strength of by at least 1/2 MMT grade in order to demonstrate improvement in strength and function.    Baseline 8/02: see eval    Time 8    Period Weeks    Target Date 07/18/20      PT LONG TERM GOAL #3   Title Patient will increase FOTO score to equal to or greater than 64% to demonstrate statistically significant improvement in mobility and quality of life.    Baseline 08/02: 55    Time 8    Status New    Target Date 07/18/20                 Plan - 05/30/20 1045    Clinical Impression Statement Patient has back pain and hip pain that decreased following treatment. She responds to manual therapy and  Lumbar extension exercises. She is doing her HEP and is able to ambulate longer distances but continues to have LE heaviness following ambulation. She will continue to benefit from skilled PT to improve mobility and decrease pain and return to PLOF.    Personal Factors and Comorbidities Age;Comorbidity 3+    Comorbidities Hypertension, IBS, anxiety    Examination-Activity Limitations Locomotion Level    Examination-Participation Restrictions Community Activity    Stability/Clinical Decision Making Evolving/Moderate complexity    Rehab Potential Good    PT Frequency 2x / week    PT Duration 8 weeks    PT Treatment/Interventions ADLs/Self Care Home Management;Canalith Repostioning;Electrical Stimulation;Gait training;Stair training;Functional mobility training;Therapeutic activities;Therapeutic exercise;Balance training;Neuromuscular re-education;Patient/family education;Manual techniques;Passive range of motion;Dry needling;Visual/perceptual remediation/compensation;Spinal  Manipulations;Joint Manipulations;Vestibular;Aquatic Therapy    PT Next Visit Plan Review HEP    PT Home Exercise Plan Medbridge: [email or web address]    Consulted and Agree with Plan of Care Patient           Patient will  benefit from skilled therapeutic intervention in order to improve the following deficits and impairments:  Abnormal gait, Decreased activity tolerance, Decreased strength, Difficulty walking, Hypomobility, Dizziness  Visit Diagnosis: Pain in left hip  Radiculopathy, lumbar region  Difficulty in walking, not elsewhere classified     Problem List Patient Active Problem List   Diagnosis Date Noted  . Acute hip pain, left   . Hypertension   . Fall   . SAH (subarachnoid hemorrhage) ([place])   . IBS (irritable bowel syndrome)     Alanson Puls, PT DPT 05/30/2020, 11:16 AM  [place] MAIN [facility] SERVICES [address] [place], [place], [address] Phone: [phone]   Fax:  [phone]  Name: Savannah Strong MRN: [id number] Date of Birth: 19-Feb-1941

## 2020-06-01 ENCOUNTER — Other Ambulatory Visit: Payer: Self-pay

## 2020-06-01 ENCOUNTER — Ambulatory Visit: Payer: Medicare Other | Admitting: Physical Therapy

## 2020-06-01 ENCOUNTER — Encounter: Payer: Self-pay | Admitting: Physical Therapy

## 2020-06-01 DIAGNOSIS — R262 Difficulty in walking, not elsewhere classified: Secondary | ICD-10-CM

## 2020-06-01 DIAGNOSIS — M25552 Pain in left hip: Secondary | ICD-10-CM | POA: Diagnosis not present

## 2020-06-01 DIAGNOSIS — M5416 Radiculopathy, lumbar region: Secondary | ICD-10-CM

## 2020-06-01 NOTE — Therapy (Signed)
[place] MAIN [facility] SERVICES [address] [place], [place], [address] Phone: [phone]   Fax:  [phone]  Physical Therapy Treatment  Patient Details  Name: Savannah Strong MRN: [id number] Date of Birth: [birth year] Referring Provider (PT): Ellender Hose, MD   Encounter Date: 06/01/2020   PT End of Session - 06/01/20 0957    Visit Number 4    Number of Visits 17    Date for PT Re-Evaluation 07/18/20    Authorization Type Eval: 08/02    PT Start Time 0948    PT Stop Time 8546    PT Time Calculation (min) 27 min    Activity Tolerance Patient tolerated treatment well;No increased pain    Behavior During Therapy WFL for tasks assessed/performed           Past Medical History:  Diagnosis Date  . Cancer ([place])    Left breast with mast  . Hypertension   . IBS (irritable bowel syndrome)   . Migraines     Past Surgical History:  Procedure Laterality Date  . masectomy    . TONSILLECTOMY      There were no vitals filed for this visit.   Subjective Assessment - 06/01/20 0956    Subjective Patient reports her pain has improved but is still getting some left hip discomfort with prolonged walking with increased LLE heaviness with walking;    Pertinent History On May 5 patient was walking with her husband on a trail outside their home when she fell on the left side of her body and hit her head. Said she was going down a hill and could not stop and then fell. CT scan showed a small subarachnoid hemorrhage. She was followed expectantly with a follow-up CAT scan on June 5 that read showed resolution of the hemorrhage. She has pain in the lumbar spine and hip that travels down the leg. She reports that after she walks, her leg feels heavy like concrete with a burning sensation down the leg.    Limitations Walking    How long can you walk comfortably? 1/2 mile    Patient Stated Goals Walk without pain    Currently in Pain? Yes    Pain Score 3      Pain Location Hip    Pain Orientation Left    Pain Descriptors / Indicators Aching;Sore    Pain Type Chronic pain    Pain Onset More than a month ago    Pain Frequency Intermittent    Aggravating Factors  increased heaviness after walking long distances    Pain Relieving Factors rest/heat/stretches    Effect of Pain on Daily Activities decreased activity tolerance;                TREATMENT: Patient prone with moist heat to low back x3 min;  Manual therapy: PT performed grade II-III PA mobs at L1, L2, L3, L4, L5, S1 20 sec bouts x2 sets each; Patient instructed in hip extension exhibiting decreased multifidi firing on left lumbar spine with RLE leg lift; Patient also reports increased pain with lifting RLE;  PT performed soft/deep tissue massage to left lumbar paraspinals including IASTM with Edge tool x12 min; Patient had increased tenderness along left quadratus lumborum;  Following soft tissue massage, PT performed grade II-III PA mobs at L1, L2, L3, L4, L5/S1 20 sec bout x1 set with improved tolerance and reduced pain reported;   Patient instructed in hip extension being able to exhibit better  RLE hip extension ROM with improved left lumbar multifidi firing and less pain/discomfort;   She reports minimal back/LE pain at end of session. Reinforced HEP with instruction to perform lumbar extension at least every hour for better pain management; Pt verbalized understanding;                       PT Education - 06/01/20 0957    Education Details Lumbar extension exercise;    Person(s) Educated Patient    Methods Explanation;Verbal cues    Comprehension Verbalized understanding;Returned demonstration;Verbal cues required;Need further instruction            PT Short Term Goals - 05/23/20 1237      PT SHORT TERM GOAL #1   Title Pt will be independent with HEP in order to improve strength and decrease back pain in order to improve pain-free function at  home.    Time 4    Period Weeks    Status New    Target Date 06/21/20             PT Long Term Goals - 05/23/20 1237      PT LONG TERM GOAL #1   Title Pt will decrease worst back and hip pain as reported on NPRS by at least 2 points in order to demonstrate clinically significant reduction in back pain.    Baseline 08/02: 7/10    Time 8    Period Weeks    Status New    Target Date 07/18/20      PT LONG TERM GOAL #2   Title Pt will increase strength of by at least 1/2 MMT grade in order to demonstrate improvement in strength and function.    Baseline 8/02: see eval    Time 8    Period Weeks    Target Date 07/18/20      PT LONG TERM GOAL #3   Title Patient will increase FOTO score to equal to or greater than 64% to demonstrate statistically significant improvement in mobility and quality of life.    Baseline 08/02: 55    Time 8    Status New    Target Date 07/18/20                 Plan - 06/01/20 1306    Clinical Impression Statement Patient late to session. She exhibits increased stiffness in lumbar spine. PT performed extensive manual therapy. Following joint mobilizations and soft tissue massage patient reports less back pain and also reports less difficulty with hip extension. She was able to exhibit better left lumbar multifidi activation following soft tissue massage. Patient would benefit from additional skilled PT intervention to improve lumbar ROM and reduce pain with ADLs. Consider adding LE strengthening to HEP at next session;    Personal Factors and Comorbidities Age;Comorbidity 3+    Comorbidities Hypertension, IBS, anxiety    Examination-Activity Limitations Locomotion Level    Examination-Participation Restrictions Community Activity    Stability/Clinical Decision Making Evolving/Moderate complexity    Rehab Potential Good    PT Frequency 2x / week    PT Duration 8 weeks    PT Treatment/Interventions ADLs/Self Care Home Management;Canalith  Repostioning;Electrical Stimulation;Gait training;Stair training;Functional mobility training;Therapeutic activities;Therapeutic exercise;Balance training;Neuromuscular re-education;Patient/family education;Manual techniques;Passive range of motion;Dry needling;Visual/perceptual remediation/compensation;Spinal Manipulations;Joint Manipulations;Vestibular;Aquatic Therapy    PT Next Visit Plan Review HEP    PT Home Exercise Plan Medbridge: [email or web address]    Consulted and Agree with Plan of Care Patient  Patient will benefit from skilled therapeutic intervention in order to improve the following deficits and impairments:  Abnormal gait, Decreased activity tolerance, Decreased strength, Difficulty walking, Hypomobility, Dizziness  Visit Diagnosis: Pain in left hip  Radiculopathy, lumbar region  Difficulty in walking, not elsewhere classified     Problem List Patient Active Problem List   Diagnosis Date Noted  . Acute hip pain, left   . Hypertension   . Fall   . SAH (subarachnoid hemorrhage) ([place])   . IBS (irritable bowel syndrome)     Langdon Crosson PT, DPT 06/01/2020, 1:08 PM  [place] MAIN [facility] SERVICES [address] [place], [place], [address] Phone: [phone]   Fax:  [phone]  Name: KENYONNA MICEK MRN: [id number] Date of Birth: 10/24/1940

## 2020-06-06 ENCOUNTER — Ambulatory Visit: Payer: Medicare Other | Admitting: Physical Therapy

## 2020-06-06 ENCOUNTER — Encounter: Payer: Self-pay | Admitting: Physical Therapy

## 2020-06-06 ENCOUNTER — Other Ambulatory Visit: Payer: Self-pay

## 2020-06-06 DIAGNOSIS — M25552 Pain in left hip: Secondary | ICD-10-CM | POA: Diagnosis not present

## 2020-06-06 DIAGNOSIS — R262 Difficulty in walking, not elsewhere classified: Secondary | ICD-10-CM

## 2020-06-06 DIAGNOSIS — M5416 Radiculopathy, lumbar region: Secondary | ICD-10-CM

## 2020-06-06 NOTE — Therapy (Signed)
[place] [facility] SERVICES [address] [place], [place], [address] Phone: [phone]   Fax:  [phone]  Physical Therapy Treatment  Patient Details  Name: Savannah Strong MRN: [id number] Date of Birth: [birth year] Referring Provider (PT): Ellender Hose, MD   Encounter Date: 06/06/2020   PT End of Session - 06/06/20 1132    Visit Number 5    Number of Visits 17    Date for PT Re-Evaluation 07/18/20    Authorization Type Eval: 08/02    PT Start Time 1017    PT Stop Time 1115    PT Time Calculation (min) 58 min    Activity Tolerance Patient tolerated treatment well;No increased pain    Behavior During Therapy WFL for tasks assessed/performed           Past Medical History:  Diagnosis Date  . Cancer ([place])    Left breast with mast  . Hypertension   . IBS (irritable bowel syndrome)   . Migraines     Past Surgical History:  Procedure Laterality Date  . masectomy    . TONSILLECTOMY      There were no vitals filed for this visit.   Subjective Assessment - 06/06/20 1127    Subjective Patient reports her pain has improved but is still getting some left hip discomfort with prolonged walking with increased LLE heaviness with walking;    Pertinent History On May 5 patient was walking with her husband on a trail outside their home when she fell on the left side of her body and hit her head. Said she was going down a hill and could not stop and then fell. CT scan showed a small subarachnoid hemorrhage. She was followed expectantly with a follow-up CAT scan on June 5 that read showed resolution of the hemorrhage. She has pain in the lumbar spine and hip that travels down the leg. She reports that after she walks, her leg feels heavy like concrete with a burning sensation down the leg.    Limitations Walking    How long can you walk comfortably? 1/2 mile    Patient Stated Goals Walk without pain    Currently in Pain? No/denies    Multiple  Pain Sites No               TREATMENT: Patient prone with moist heat to low back x3 min;  Manual therapy: PT performed grade II-III PA mobs at L1, L2, L3, L4, L5, S1 20 sec bouts x1 sets each; Patient able to exhibit good joint mobility with minimal pain;   Patient instructed in hip extension exhibiting decreased multifidi firing on left lumbar spine with RLE leg lift; Patient also reports increased pain with lifting RLE;  PT performed soft/deep tissue massage to left lumbar paraspinals including IASTM with Edge tool x20 min; Patient had increased tenderness along left quadratus lumborum;  Following soft tissue massage, PT performed grade II-III PA mobs at L1, L2, L3, L4, L5/S1 20 sec bout x1 set with improved tolerance and reduced pain reported;   Patient prone: Hip extension SLR x10 reps each LE with cues to avoid trunk rotation for better hip strengthening; Patient does have discomfort when lift RLE;   Patient continues to have pain along left SI joint; PT assessed SI joint with prone knee bend test and supine long sit test both (+) on left side going long to short indicating anterior rotation;  PT instructed patient in supine isometric LLE hip extension  to strengthen posterior hip to facilitate posterior rotation 5 sec hold x5 reps;  Patient sidelying PT performed grade II-III AP mobs to left innominate grade II-III 10 sec bouts x5 reps; Patient tolerated well reports no pain upon standing. She also reports feeling a good stretch with joint mobilizations;  She reports minimal back/LE pain at end of session. Instructed patient in advanced HEP focusing on posterior hip strengthening to facilitate better hip mobility; Patient tolerated well. She verbalized understanding.                  PT Education - 06/06/20 1132    Education Details lumbar extension strengthening, HEP    Person(s) Educated Patient    Methods Explanation;Verbal cues    Comprehension  Verbalized understanding;Returned demonstration;Verbal cues required;Need further instruction            PT Short Term Goals - 05/23/20 1237      PT SHORT TERM GOAL #1   Title Pt will be independent with HEP in order to improve strength and decrease back pain in order to improve pain-free function at home.    Time 4    Period Weeks    Status New    Target Date 06/21/20             PT Long Term Goals - 05/23/20 1237      PT LONG TERM GOAL #1   Title Pt will decrease worst back and hip pain as reported on NPRS by at least 2 points in order to demonstrate clinically significant reduction in back pain.    Baseline 08/02: 7/10    Time 8    Period Weeks    Status New    Target Date 07/18/20      PT LONG TERM GOAL #2   Title Pt will increase strength of by at least 1/2 MMT grade in order to demonstrate improvement in strength and function.    Baseline 8/02: see eval    Time 8    Period Weeks    Target Date 07/18/20      PT LONG TERM GOAL #3   Title Patient will increase FOTO score to equal to or greater than 64% to demonstrate statistically significant improvement in mobility and quality of life.    Baseline 08/02: 55    Time 8    Status New    Target Date 07/18/20                 Plan - 06/06/20 1134    Clinical Impression Statement Patient motivated and participated well within session. She is exhibiting improvement in lumbar joint mobility with less stiffness noted during central PA mobs. She tolerated soft tissue massage well exhibiting better multifidi activtion with hip extension. Patient continues to have pain in low back when extending RLE but no pain when extending LLE. PT assessed SI joints with LLE appearing anterior tilted with (+) long sit test with left going long to short indicating anterior pelvic rotation. PT instructed patient in posterior hip strengthening. Patient reports no pain at end of session; She would benefit from additional skilled PT  Intervention to improve strength and mobility and reduce pain with ADLs.    Personal Factors and Comorbidities Age;Comorbidity 3+    Comorbidities Hypertension, IBS, anxiety    Examination-Activity Limitations Locomotion Level    Examination-Participation Restrictions Community Activity    Stability/Clinical Decision Making Evolving/Moderate complexity    Rehab Potential Good    PT Frequency 2x / week  PT Duration 8 weeks    PT Treatment/Interventions ADLs/Self Care Home Management;Canalith Repostioning;Electrical Stimulation;Gait training;Stair training;Functional mobility training;Therapeutic activities;Therapeutic exercise;Balance training;Neuromuscular re-education;Patient/family education;Manual techniques;Passive range of motion;Dry needling;Visual/perceptual remediation/compensation;Spinal Manipulations;Joint Manipulations;Vestibular;Aquatic Therapy    PT Next Visit Plan Review HEP    PT Home Exercise Plan Medbridge: [email or web address]    Consulted and Agree with Plan of Care Patient           Patient will benefit from skilled therapeutic intervention in order to improve the following deficits and impairments:  Abnormal gait, Decreased activity tolerance, Decreased strength, Difficulty walking, Hypomobility, Dizziness  Visit Diagnosis: Pain in left hip  Radiculopathy, lumbar region  Difficulty in walking, not elsewhere classified     Problem List Patient Active Problem List   Diagnosis Date Noted  . Acute hip pain, left   . Hypertension   . Fall   . SAH (subarachnoid hemorrhage) ([place])   . IBS (irritable bowel syndrome)     Somtochukwu Woollard PT, DPT 06/06/2020, 11:41 AM  [place] [facility]IN [facility] SERVICES [address] [place], [place], [address] Phone: [phone]   Fax:  [phone]  Name: CHRYSTINA NAFF MRN: [id number] Date of Birth: Aug 08, 1941

## 2020-06-06 NOTE — Patient Instructions (Signed)
Access Code: [email or web address] URL: [email or web address] Date: 06/06/2020 Prepared by: Blanche East  Exercises Prone Hip Extension - 1 x daily - 7 x weekly - 1 sets - 10 reps Beginner Bridge - 1 x daily - 7 x weekly - 1 sets - 10 reps

## 2020-06-08 ENCOUNTER — Encounter: Payer: Medicare Other | Admitting: Physical Therapy

## 2020-06-14 ENCOUNTER — Other Ambulatory Visit: Payer: Self-pay

## 2020-06-14 ENCOUNTER — Ambulatory Visit: Payer: Medicare Other | Admitting: Physical Therapy

## 2020-06-14 ENCOUNTER — Encounter: Payer: Self-pay | Admitting: Physical Therapy

## 2020-06-14 DIAGNOSIS — M25552 Pain in left hip: Secondary | ICD-10-CM

## 2020-06-14 DIAGNOSIS — R262 Difficulty in walking, not elsewhere classified: Secondary | ICD-10-CM

## 2020-06-14 DIAGNOSIS — M5416 Radiculopathy, lumbar region: Secondary | ICD-10-CM

## 2020-06-14 NOTE — Therapy (Signed)
[place] [facility] SERVICES [address] [place], [place], [address] Phone: [phone]   Fax:  [phone]  Physical Therapy Treatment  Patient Details  Name: Savannah Strong MRN: [id number] Date of Birth: 1941-08-11 Referring Provider (PT): Ellender Hose, MD   Encounter Date: 06/14/2020   PT End of Session - 06/14/20 0929    Visit Number 6    Number of Visits 17    Date for PT Re-Evaluation 07/18/20    Authorization Type Eval: 08/02    PT Start Time 0932    PT Stop Time 6387    PT Time Calculation (min) 43 min    Activity Tolerance Patient tolerated treatment well;No increased pain    Behavior During Therapy WFL for tasks assessed/performed           Past Medical History:  Diagnosis Date   Cancer ([place])    Left breast with mast   Hypertension    IBS (irritable bowel syndrome)    Migraines     Past Surgical History:  Procedure Laterality Date   masectomy     TONSILLECTOMY      There were no vitals filed for this visit.   Subjective Assessment - 06/14/20 0938    Subjective Patient reports she is still having trouble walking longer distances. She is still experiencing a lumbar shift; She reports her left leg is leading to a limp;    Pertinent History On May 5 patient was walking with her husband on a trail outside their home when she fell on the left side of her body and hit her head. Said she was going down a hill and could not stop and then fell. CT scan showed a small subarachnoid hemorrhage. She was followed expectantly with a follow-up CAT scan on June 5 that read showed resolution of the hemorrhage. She has pain in the lumbar spine and hip that travels down the leg. She reports that after she walks, her leg feels heavy like concrete with a burning sensation down the leg.    Limitations Walking    How long can you walk comfortably? 1/2 mile    Patient Stated Goals Walk without pain    Currently in Pain? Yes    Pain  Score 3     Pain Location Back    Pain Orientation Left    Pain Descriptors / Indicators Aching;Sore    Pain Type Chronic pain    Pain Radiating Towards radiates down LLE, still getting heavy;    Pain Onset More than a month ago    Pain Frequency Intermittent    Aggravating Factors  increased heaviness after walking long distance    Pain Relieving Factors rest/heat/stretches    Effect of Pain on Daily Activities decreased activity tolerance;    Multiple Pain Sites No                 TREATMENT: Patient prone with moist heat to low back x13min;  Manual therapy: PT performed grade II-III PA mobs at L1, L2, L3, L4, L5, S1 20 secbouts x1sets each; Patient able to exhibit good joint mobility with minimal pain;   Patient instructed in hip extension exhibiting decreased multifidi firing on left lumbar spine with RLE leg lift; Patient also reports increased pain with lifting RLE;  PT performed soft/deep tissue massage to left lumbar paraspinals including IASTM with Edge toolx15 min; Patient had increased tenderness along left quadratus lumborum;  Following soft tissue massage, PT performed grade II-III  PA mobs at L1, L2, L3, L4, L5/S1 20 sec bout x1 set with improved tolerance and reduced pain reported;   Patient prone: Press up on BUE x10 reps with good lumbar extension noted x10 reps;  Hip extension SLR x12 reps each LE with cues to avoid trunk rotation for better hip strengthening; Patient does have discomfort when lift RLE; She was able to exhibit better hip extension following manual therapy;   Patient continues to have pain along left SI joint;   Patient sidelying PT performed grade II-III AP mobs to left innominate grade II-III 10 sec bouts x5 reps;  Patient hooklying: Single knee to chest stretch LLE x20 sec Hamstring stretch SLR with ankle pump for neural flossing x10 reps;  Patient tolerated well reports slight left hip pain but reports no pain down LLE. She also  reports feeling a good stretch with joint mobilizations;  She reports minimal back/LE pain at end of session. Instructed patient in advanced HEP focusing on posterior hip strengthening to facilitate better hip mobility; Patient tolerated well. She verbalized understanding.                          PT Education - 06/14/20 0929    Education Details lumbar strengthening, positioning, HEP    Person(s) Educated Patient    Methods Explanation;Verbal cues    Comprehension Verbalized understanding;Returned demonstration;Verbal cues required;Need further instruction            PT Short Term Goals - 05/23/20 1237      PT SHORT TERM GOAL #1   Title Pt will be independent with HEP in order to improve strength and decrease back pain in order to improve pain-free function at home.    Time 4    Period Weeks    Status New    Target Date 06/21/20             PT Long Term Goals - 05/23/20 1237      PT LONG TERM GOAL #1   Title Pt will decrease worst back and hip pain as reported on NPRS by at least 2 points in order to demonstrate clinically significant reduction in back pain.    Baseline 08/02: 7/10    Time 8    Period Weeks    Status New    Target Date 07/18/20      PT LONG TERM GOAL #2   Title Pt will increase strength of by at least 1/2 MMT grade in order to demonstrate improvement in strength and function.    Baseline 8/02: see eval    Time 8    Period Weeks    Target Date 07/18/20      PT LONG TERM GOAL #3   Title Patient will increase FOTO score to equal to or greater than 64% to demonstrate statistically significant improvement in mobility and quality of life.    Baseline 08/02: 55    Time 8    Status New    Target Date 07/18/20                 Plan - 06/14/20 1030    Clinical Impression Statement Patient motivated and participated well within session; She continues to have pain/tenderness along left SI joint. Patient tolerated manual  therapy well exhibiting improved joint mobility and less stiffness in lumbar paraspinals. Patient also able to exhibit better hip extension AROM following manual therapy. Patient does report slight left posterior hip pain upon standing  but denies any pain down LLE. She would benefit from additional skilled PT intervention to improve strength and mobility;    Personal Factors and Comorbidities Age;Comorbidity 3+    Comorbidities Hypertension, IBS, anxiety    Examination-Activity Limitations Locomotion Level    Examination-Participation Restrictions Community Activity    Stability/Clinical Decision Making Evolving/Moderate complexity    Rehab Potential Good    PT Frequency 2x / week    PT Duration 8 weeks    PT Treatment/Interventions ADLs/Self Care Home Management;Canalith Repostioning;Electrical Stimulation;Gait training;Stair training;Functional mobility training;Therapeutic activities;Therapeutic exercise;Balance training;Neuromuscular re-education;Patient/family education;Manual techniques;Passive range of motion;Dry needling;Visual/perceptual remediation/compensation;Spinal Manipulations;Joint Manipulations;Vestibular;Aquatic Therapy    PT Next Visit Plan Review HEP    PT Home Exercise Plan Medbridge: [email or web address]    Consulted and Agree with Plan of Care Patient           Patient will benefit from skilled therapeutic intervention in order to improve the following deficits and impairments:  Abnormal gait, Decreased activity tolerance, Decreased strength, Difficulty walking, Hypomobility, Dizziness  Visit Diagnosis: Pain in left hip  Radiculopathy, lumbar region  Difficulty in walking, not elsewhere classified     Problem List Patient Active Problem List   Diagnosis Date Noted   Acute hip pain, left    Hypertension    Fall    SAH (subarachnoid hemorrhage) ([facility])    IBS (irritable bowel syndrome)     Savannah Strong PT, DPT 06/14/2020, 11:06 AM  [place] [address] [place], [place], [address] Phone: [phone]   Fax:  [phone]  Name: Savannah Strong MRN: [id number] Date of Birth: Mar 03, 1941

## 2020-06-16 ENCOUNTER — Encounter: Payer: Self-pay | Admitting: Physical Therapy

## 2020-06-16 ENCOUNTER — Ambulatory Visit: Payer: Medicare Other | Admitting: Physical Therapy

## 2020-06-16 ENCOUNTER — Other Ambulatory Visit: Payer: Self-pay

## 2020-06-16 DIAGNOSIS — M25552 Pain in left hip: Secondary | ICD-10-CM

## 2020-06-16 DIAGNOSIS — M5416 Radiculopathy, lumbar region: Secondary | ICD-10-CM

## 2020-06-16 DIAGNOSIS — R262 Difficulty in walking, not elsewhere classified: Secondary | ICD-10-CM

## 2020-06-16 NOTE — Therapy (Signed)
[place] MAIN [facility] SERVICES [address] [place], [place], [address] Phone: [phone]   Fax:  [phone]  Physical Therapy Treatment  Patient Details  Name: Savannah Strong MRN: [id number] Date of Birth: 09-07-1941 Referring Provider (PT): Ellender Hose, MD   Encounter Date: 06/16/2020   PT End of Session - 06/16/20 1149    Visit Number 7    Number of Visits 17    Date for PT Re-Evaluation 07/18/20    Authorization Type Eval: 08/02    PT Start Time 1147    PT Stop Time 1230    PT Time Calculation (min) 43 min    Activity Tolerance Patient tolerated treatment well;No increased pain    Behavior During Therapy WFL for tasks assessed/performed           Past Medical History:  Diagnosis Date  . Cancer ([place])    Left breast with mast  . Hypertension   . IBS (irritable bowel syndrome)   . Migraines     Past Surgical History:  Procedure Laterality Date  . masectomy    . TONSILLECTOMY      There were no vitals filed for this visit.   Subjective Assessment - 06/16/20 1151    Subjective Patient reports doing exercises more this week. She is unable to report if her pain is worse or better. She does feel like her back pain is slightly worse;    Pertinent History On May 5 patient was walking with her husband on a trail outside their home when she fell on the left side of her body and hit her head. Said she was going down a hill and could not stop and then fell. CT scan showed a small subarachnoid hemorrhage. She was followed expectantly with a follow-up CAT scan on June 5 that read showed resolution of the hemorrhage. She has pain in the lumbar spine and hip that travels down the leg. She reports that after she walks, her leg feels heavy like concrete with a burning sensation down the leg.    Limitations Walking    How long can you walk comfortably? 1/2 mile    Patient Stated Goals Walk without pain    Currently in Pain? Yes    Pain Score  4     Pain Location Back    Pain Orientation Left;Lower    Pain Descriptors / Indicators Aching;Sore    Pain Type Chronic pain    Pain Onset More than a month ago    Pain Frequency Intermittent    Aggravating Factors  increased heaviness in LLE after walking long distances;    Pain Relieving Factors rest/heat/stretches    Effect of Pain on Daily Activities decreased activity tolerance;    Multiple Pain Sites No                   TREATMENT: Patient prone with moist heat to low back x84min;  Manual therapy: PT performed soft/deep tissue massage to left lumbar paraspinals including IASTM with Edge toolx98min; Patient had increased tenderness along left quadratus lumborum;  Following soft tissue massage, PT performed grade II-III PA mobs at L1, L2, L3, L4, L5/S1 20 sec bout x1 set with improved tolerance;    Patient prone: Alternate hamstring curl x20 reps bilaterally;  Press up on BUE x10 reps  With overpressure to lumbar extension x10 reps;  During press ups patient reports increased numbness/tingling down LLE;  Patient transitioned to hooklying: -lumbar trunk rotation x10 reps; -  single knee to chest stretch 20 sec hold x2 reps each LE: -Hamstring stretch SLR with ankle pump for neural flossing x10 reps;  -posterior pelvic rock 5 sec hold x10 reps;     Patient requires min-mod VCS for proper exercise technique with all exercise. She has a difficult time verbalizing exercise/activities that exacerbate her pain. Patient initially reported that extension helped alleviate her pain, however during prone press ups she started having increased numbness down LLE to toe. PT instructed patient in lumbar flexion exercise. She reports resolution of LLE symptoms but continues to have back pain; Will have patient try lumbar flexion exercise at home to see if that changes her symptoms;                    PT Education - 06/16/20 1149    Education Details Lumbar  strengthening, ROM/HEP    Person(s) Educated Patient    Methods Explanation;Verbal cues    Comprehension Verbalized understanding;Returned demonstration;Verbal cues required;Need further instruction            PT Short Term Goals - 05/23/20 1237      PT SHORT TERM GOAL #1   Title Pt will be independent with HEP in order to improve strength and decrease back pain in order to improve pain-free function at home.    Time 4    Period Weeks    Status New    Target Date 06/21/20             PT Long Term Goals - 05/23/20 1237      PT LONG TERM GOAL #1   Title Pt will decrease worst back and hip pain as reported on NPRS by at least 2 points in order to demonstrate clinically significant reduction in back pain.    Baseline 08/02: 7/10    Time 8    Period Weeks    Status New    Target Date 07/18/20      PT LONG TERM GOAL #2   Title Pt will increase strength of by at least 1/2 MMT grade in order to demonstrate improvement in strength and function.    Baseline 8/02: see eval    Time 8    Period Weeks    Target Date 07/18/20      PT LONG TERM GOAL #3   Title Patient will increase FOTO score to equal to or greater than 64% to demonstrate statistically significant improvement in mobility and quality of life.    Baseline 08/02: 55    Time 8    Status New    Target Date 07/18/20                 Plan - 06/16/20 1231    Clinical Impression Statement Patient motivated and participated well within session. Patient requires min-mod VCS for proper exercise technique with all exercise. She has a difficult time verbalizing exercise/activities that exacerbate her pain. Patient initially reported that extension helped alleviate her pain, however during prone press ups she started having increased numbness down LLE to toe. PT instructed patient in lumbar flexion exercise. She reports resolution of LLE symptoms but continues to have back pain; Will have patient try lumbar flexion exercise  at home to see if that changes her symptoms; She would beneft from additional skilled PT Intervention to improve strength, balance and mobility;    Personal Factors and Comorbidities Age;Comorbidity 3+    Comorbidities Hypertension, IBS, anxiety    Examination-Activity Limitations Locomotion Level  Examination-Participation Restrictions Community Activity    Stability/Clinical Decision Making Evolving/Moderate complexity    Rehab Potential Good    PT Frequency 2x / week    PT Duration 8 weeks    PT Treatment/Interventions ADLs/Self Care Home Management;Canalith Repostioning;Electrical Stimulation;Gait training;Stair training;Functional mobility training;Therapeutic activities;Therapeutic exercise;Balance training;Neuromuscular re-education;Patient/family education;Manual techniques;Passive range of motion;Dry needling;Visual/perceptual remediation/compensation;Spinal Manipulations;Joint Manipulations;Vestibular;Aquatic Therapy    PT Next Visit Plan Review HEP    PT Home Exercise Plan Medbridge: [email or web address]    Consulted and Agree with Plan of Care Patient           Patient will benefit from skilled therapeutic intervention in order to improve the following deficits and impairments:  Abnormal gait, Decreased activity tolerance, Decreased strength, Difficulty walking, Hypomobility, Dizziness  Visit Diagnosis: Pain in left hip  Radiculopathy, lumbar region  Difficulty in walking, not elsewhere classified     Problem List Patient Active Problem List   Diagnosis Date Noted  . Acute hip pain, left   . Hypertension   . Fall   . SAH (subarachnoid hemorrhage) ([place])   . IBS (irritable bowel syndrome)     Ansar Skoda PT, DPT 06/16/2020, 12:32 PM  [place] MAIN [facility] SERVICES [address] [place], [place], [address] Phone: [phone]   Fax:  [phone]  Name: Savannah Strong MRN: [id number] Date of Birth: 1941-02-24

## 2020-06-16 NOTE — Patient Instructions (Signed)
Access Code: [email or web address] URL: [email or web address] Date: 06/16/2020 Prepared by: Blanche East  Exercises Supine Lower Trunk Rotation - 2 x daily - 7 x weekly - 3 sets - 10 reps Hooklying Single Knee to Chest Stretch - 2 x daily - 7 x weekly - 1 sets - 3 reps - 20 sec hold Supine Posterior Pelvic Tilt with Knee Rocks - 2 x daily - 7 x weekly - 1 sets - 10 reps - 5 sec hold

## 2020-06-21 ENCOUNTER — Ambulatory Visit: Payer: Medicare Other | Admitting: Physical Therapy

## 2020-06-23 ENCOUNTER — Ambulatory Visit: Payer: Medicare Other | Admitting: Physical Therapy

## 2020-06-30 ENCOUNTER — Ambulatory Visit: Payer: Medicare Other | Attending: Internal Medicine | Admitting: Physical Therapy

## 2020-06-30 ENCOUNTER — Encounter: Payer: Self-pay | Admitting: Physical Therapy

## 2020-06-30 ENCOUNTER — Other Ambulatory Visit: Payer: Self-pay

## 2020-06-30 DIAGNOSIS — M5416 Radiculopathy, lumbar region: Secondary | ICD-10-CM | POA: Diagnosis present

## 2020-06-30 DIAGNOSIS — R262 Difficulty in walking, not elsewhere classified: Secondary | ICD-10-CM | POA: Diagnosis present

## 2020-06-30 DIAGNOSIS — M25552 Pain in left hip: Secondary | ICD-10-CM | POA: Diagnosis present

## 2020-06-30 NOTE — Therapy (Signed)
[place] MAIN [facility] SERVICES [address] [place], [place], [address] Phone: [phone]   Fax:  [phone]  Physical Therapy Treatment  Patient Details  Name: Savannah Strong MRN: [id number] Date of Birth: Apr 07, 1941 Referring Provider (PT): Ellender Hose, MD   Encounter Date: 06/30/2020   PT End of Session - 06/30/20 1116    Visit Number 8    Number of Visits 17    Date for PT Re-Evaluation 07/18/20    Authorization Type Eval: 08/02    PT Start Time 1110    PT Stop Time 1150    PT Time Calculation (min) 40 min    Activity Tolerance Patient tolerated treatment well;No increased pain    Behavior During Therapy WFL for tasks assessed/performed           Past Medical History:  Diagnosis Date  . Cancer ([place])    Left breast with mast  . Hypertension   . IBS (irritable bowel syndrome)   . Migraines     Past Surgical History:  Procedure Laterality Date  . masectomy    . TONSILLECTOMY      There were no vitals filed for this visit.   Subjective Assessment - 06/30/20 1115    Subjective Patient reports her new exercises having helped a lot. She reports doing them 2x a day and states her pain has been significantly better; She did miss a few therapy visits due to recent sickness. Feeling better now.    Pertinent History On May 5 patient was walking with her husband on a trail outside their home when she fell on the left side of her body and hit her head. Said she was going down a hill and could not stop and then fell. CT scan showed a small subarachnoid hemorrhage. She was followed expectantly with a follow-up CAT scan on June 5 that read showed resolution of the hemorrhage. She has pain in the lumbar spine and hip that travels down the leg. She reports that after she walks, her leg feels heavy like concrete with a burning sensation down the leg.    Limitations Walking    How long can you walk comfortably? 1/2 mile    Patient Stated Goals  Walk without pain    Currently in Pain? Yes    Pain Score 2     Pain Location Back    Pain Orientation Lower    Pain Descriptors / Indicators Aching;Sore    Pain Type Chronic pain    Pain Onset More than a month ago    Pain Frequency Intermittent    Aggravating Factors  increased pain with standing/walking    Pain Relieving Factors rest/heat/stretches    Effect of Pain on Daily Activities decreased activity tolerance;             TREATMENT: Patient hooklying with moist heat to low back; -lumbar trunk rotation x10 reps each direction; -single knee to chest stretch 20 sec hold x2 reps bilaterally; -posterior pelvic rock x10 reps; -posterior pelvic rock  With alternate march x10 reps  With alternate UE lift x10 reps;  With alternate UE/LE lift x10 reps Patient required min-moderate verbal/tactile cues for correct exercise technique and to increase core abdominal stabilization with UE/LE movement -posterior pelvic rock with BLE leg lift hip/knee 90/90 5 sec hold x5 reps to challenge core stabilization;   Hooklying: SLR hamstring stretch with ankle DF/PF for neural stretch x10 reps each LE; Patient does require min VCs to relax  during exercise for better flexibility;  Patient tolerated session well. She reports less back pain. She does report fatigue in lower abdominals with advanced exercise;                             PT Education - 06/30/20 1116    Education Details lumbar ROM/strengthening, HEP    Person(s) Educated Patient    Methods Explanation;Verbal cues    Comprehension Verbalized understanding;Returned demonstration;Verbal cues required;Need further instruction            PT Short Term Goals - 05/23/20 1237      PT SHORT TERM GOAL #1   Title Pt will be independent with HEP in order to improve strength and decrease back pain in order to improve pain-free function at home.    Time 4    Period Weeks    Status New    Target Date 06/21/20              PT Long Term Goals - 05/23/20 1237      PT LONG TERM GOAL #1   Title Pt will decrease worst back and hip pain as reported on NPRS by at least 2 points in order to demonstrate clinically significant reduction in back pain.    Baseline 08/02: 7/10    Time 8    Period Weeks    Status New    Target Date 07/18/20      PT LONG TERM GOAL #2   Title Pt will increase strength of by at least 1/2 MMT grade in order to demonstrate improvement in strength and function.    Baseline 8/02: see eval    Time 8    Period Weeks    Target Date 07/18/20      PT LONG TERM GOAL #3   Title Patient will increase FOTO score to equal to or greater than 64% to demonstrate statistically significant improvement in mobility and quality of life.    Baseline 08/02: 55    Time 8    Status New    Target Date 07/18/20                 Plan - 06/30/20 1139    Clinical Impression Statement Patient motivated and participated well within session. She was instructed in advanced lumbar flexion/core strengthening exercise. She does require min VCs for proper exercise technique. patient does fatigue quickly requiring cues to  continue engaging lower abdominals/core contraction with UE/LE movement especially with increased repetition. Provided patient with written instruction for increased adherence with HEP. She would benefit from additional skilled PT intervention to improve strength,  and mobility while reducing pain;    Personal Factors and Comorbidities Age;Comorbidity 3+    Comorbidities Hypertension, IBS, anxiety    Examination-Activity Limitations Locomotion Level    Examination-Participation Restrictions Community Activity    Stability/Clinical Decision Making Evolving/Moderate complexity    Rehab Potential Good    PT Frequency 2x / week    PT Duration 8 weeks    PT Treatment/Interventions ADLs/Self Care Home Management;Canalith Repostioning;Electrical Stimulation;Gait training;Stair  training;Functional mobility training;Therapeutic activities;Therapeutic exercise;Balance training;Neuromuscular re-education;Patient/family education;Manual techniques;Passive range of motion;Dry needling;Visual/perceptual remediation/compensation;Spinal Manipulations;Joint Manipulations;Vestibular;Aquatic Therapy    PT Next Visit Plan Review HEP    PT Home Exercise Plan Medbridge: [email or web address]    Consulted and Agree with Plan of Care Patient           Patient will benefit from skilled therapeutic intervention in order  to improve the following deficits and impairments:  Abnormal gait, Decreased activity tolerance, Decreased strength, Difficulty walking, Hypomobility, Dizziness  Visit Diagnosis: Pain in left hip  Radiculopathy, lumbar region  Difficulty in walking, not elsewhere classified     Problem List Patient Active Problem List   Diagnosis Date Noted  . Acute hip pain, left   . Hypertension   . Fall   . SAH (subarachnoid hemorrhage) ([place])   . IBS (irritable bowel syndrome)     Capri Raben PT, DPT 06/30/2020, 12:51 PM  [place] MAIN [facility] SERVICES [address] [place], [place], [address] Phone: [phone]   Fax:  [phone]  Name: Savannah Strong MRN: [id number] Date of Birth: 01-20-1941

## 2020-06-30 NOTE — Patient Instructions (Signed)
Access Code: [email or web address] URL: [email or web address] Date: 06/30/2020 Prepared by: Blanche East  Exercises Supine March - 1 x daily - 7 x weekly - 1 sets - 10 reps Dead Bug Alternating Arm Extension - 1 x daily - 7 x weekly - 1 sets - 10 reps Dead Bug - 1 x daily - 7 x weekly - 1 sets - 10 reps Bilateral Bent Leg Lift - 1 x daily - 7 x weekly - 1 sets - 5 reps - 5 sec hold

## 2020-07-05 ENCOUNTER — Encounter: Payer: Self-pay | Admitting: Physical Therapy

## 2020-07-05 ENCOUNTER — Ambulatory Visit: Payer: Medicare Other | Admitting: Physical Therapy

## 2020-07-05 ENCOUNTER — Other Ambulatory Visit: Payer: Self-pay

## 2020-07-05 DIAGNOSIS — M25552 Pain in left hip: Secondary | ICD-10-CM | POA: Diagnosis not present

## 2020-07-05 DIAGNOSIS — M5416 Radiculopathy, lumbar region: Secondary | ICD-10-CM

## 2020-07-05 DIAGNOSIS — R262 Difficulty in walking, not elsewhere classified: Secondary | ICD-10-CM

## 2020-07-05 NOTE — Therapy (Signed)
[place] MAIN [facility] SERVICES [address] [place], [place], [address] Phone: [phone]   Fax:  [phone]  Physical Therapy Treatment  Patient Details  Name: Savannah Strong MRN: [id number] Date of Birth: 1940-10-29 Referring Provider (PT): Ellender Hose, MD   Encounter Date: 07/05/2020   PT End of Session - 07/05/20 0954    Visit Number 9    Number of Visits 17    Date for PT Re-Evaluation 07/18/20    Authorization Type Eval: 08/02    PT Start Time 0935    PT Stop Time 8466    PT Time Calculation (min) 40 min    Activity Tolerance Patient tolerated treatment well;No increased pain    Behavior During Therapy WFL for tasks assessed/performed           Past Medical History:  Diagnosis Date   Cancer ([place])    Left breast with mast   Hypertension    IBS (irritable bowel syndrome)    Migraines     Past Surgical History:  Procedure Laterality Date   masectomy     TONSILLECTOMY      There were no vitals filed for this visit.   Subjective Assessment - 07/05/20 0940    Subjective Patient reports continued left posterior hip pain especially with prolonged walking. She was able to walk about 1/4 mile and then she starts feeling the LLE leg;    Pertinent History On May 5 patient was walking with her husband on a trail outside their home when she fell on the left side of her body and hit her head. Said she was going down a hill and could not stop and then fell. CT scan showed a small subarachnoid hemorrhage. She was followed expectantly with a follow-up CAT scan on June 5 that read showed resolution of the hemorrhage. She has pain in the lumbar spine and hip that travels down the leg. She reports that after she walks, her leg feels heavy like concrete with a burning sensation down the leg.    Limitations Walking    How long can you walk comfortably? 1/2 mile    Patient Stated Goals Walk without pain    Currently in Pain? Yes    Pain  Score 5     Pain Location Back    Pain Orientation Left    Pain Descriptors / Indicators Aching;Sore    Pain Type Chronic pain    Pain Onset More than a month ago    Pain Frequency Intermittent    Aggravating Factors  increased pain with standing/walking    Pain Relieving Factors rest/heat/stretches    Effect of Pain on Daily Activities decreased activity tolerance;    Multiple Pain Sites No              TREATMENT: Patient educated in HEP and expectations regarding plan of care and how symptoms can improve with adherence.   Patient hooklying with moist heat to low back; -single knee to chest stretch 20 sec hold x2 reps bilaterally; -posterior pelvic rock 5 sec hold x10 reps; -posterior pelvic rock             With alternate march x10 reps             With alternate UE/LE lift x10 reps Patient required min-moderate verbal/tactile cues for correct exercise technique and to increase core abdominal stabilization with UE/LE movement; initially patient was letting go of core abdominal activation but then with cues was  able to increase posterior pelvic rock with LE movement;   -posterior pelvic rock with BLE leg lift hip/knee 90/90 5 sec hold x5 reps to challenge core stabilization;   Patient tolerated session well. She reports less back pain. She does report fatigue in lower abdominals with advanced exercise; Patient did require frequent cues for increased core stabilization with LE movement;                          PT Education - 07/05/20 0954    Education Details Lumbar ROM/strengthening, HEP    Person(s) Educated Patient    Methods Explanation;Verbal cues    Comprehension Verbalized understanding;Returned demonstration;Verbal cues required;Need further instruction            PT Short Term Goals - 05/23/20 1237      PT SHORT TERM GOAL #1   Title Pt will be independent with HEP in order to improve strength and decrease back pain in order to improve  pain-free function at home.    Time 4    Period Weeks    Status New    Target Date 06/21/20             PT Long Term Goals - 05/23/20 1237      PT LONG TERM GOAL #1   Title Pt will decrease worst back and hip pain as reported on NPRS by at least 2 points in order to demonstrate clinically significant reduction in back pain.    Baseline 08/02: 7/10    Time 8    Period Weeks    Status New    Target Date 07/18/20      PT LONG TERM GOAL #2   Title Pt will increase strength of by at least 1/2 MMT grade in order to demonstrate improvement in strength and function.    Baseline 8/02: see eval    Time 8    Period Weeks    Target Date 07/18/20      PT LONG TERM GOAL #3   Title Patient will increase FOTO score to equal to or greater than 64% to demonstrate statistically significant improvement in mobility and quality of life.    Baseline 08/02: 55    Time 8    Status New    Target Date 07/18/20                 Plan - 07/05/20 1258    Clinical Impression Statement Patient motivated and participated well within session. She did require extensive education regarding HEP adherence and ways to avoid reinjury with ADLs. Patient reports doing HEP but with instruction it appears she has not been doing them correctly. She does report reduced back/hip pain at end of session following core strengthening. patient would benefit from additional skilled PT intervention to improve strength and postural control and reduce back pain;    Personal Factors and Comorbidities Age;Comorbidity 3+    Comorbidities Hypertension, IBS, anxiety    Examination-Activity Limitations Locomotion Level    Examination-Participation Restrictions Community Activity    Stability/Clinical Decision Making Evolving/Moderate complexity    Rehab Potential Good    PT Frequency 2x / week    PT Duration 8 weeks    PT Treatment/Interventions ADLs/Self Care Home Management;Canalith Repostioning;Electrical Stimulation;Gait  training;Stair training;Functional mobility training;Therapeutic activities;Therapeutic exercise;Balance training;Neuromuscular re-education;Patient/family education;Manual techniques;Passive range of motion;Dry needling;Visual/perceptual remediation/compensation;Spinal Manipulations;Joint Manipulations;Vestibular;Aquatic Therapy    PT Next Visit Plan Review HEP    Jennette:  [email or web address]    Consulted and Agree with Plan of Care Patient           Patient will benefit from skilled therapeutic intervention in order to improve the following deficits and impairments:  Abnormal gait, Decreased activity tolerance, Decreased strength, Difficulty walking, Hypomobility, Dizziness  Visit Diagnosis: Pain in left hip  Radiculopathy, lumbar region  Difficulty in walking, not elsewhere classified     Problem List Patient Active Problem List   Diagnosis Date Noted   Acute hip pain, left    Hypertension    Fall    SAH (subarachnoid hemorrhage) ([facility])    IBS (irritable bowel syndrome)     Geniyah Eischeid PT, DPT 07/05/2020, 1:08 PM  [place] [facility] MAIN [facility] SERVICES [address] [place], [place], [address] Phone: [phone]   Fax:  [phone]  Name: ACELYNN DEJONGE MRN: [id number] Date of Birth: 22-Oct-1941

## 2020-07-07 ENCOUNTER — Encounter: Payer: Self-pay | Admitting: Physical Therapy

## 2020-07-07 ENCOUNTER — Other Ambulatory Visit: Payer: Self-pay

## 2020-07-07 ENCOUNTER — Ambulatory Visit: Payer: Medicare Other | Admitting: Physical Therapy

## 2020-07-07 DIAGNOSIS — M25552 Pain in left hip: Secondary | ICD-10-CM

## 2020-07-07 DIAGNOSIS — R262 Difficulty in walking, not elsewhere classified: Secondary | ICD-10-CM

## 2020-07-07 DIAGNOSIS — M5416 Radiculopathy, lumbar region: Secondary | ICD-10-CM

## 2020-07-07 NOTE — Therapy (Signed)
[place] MAIN [facility] SERVICES [address] [place], [place], [address] Phone: [phone]   Fax:  [phone]  Physical Therapy Treatment Physical Therapy Progress Note   Dates of reporting period  05/23/20   to   07/07/20   Patient Details  Name: Savannah Strong MRN: [id number] Date of Birth: Aug 11, 1941 Referring Provider (PT): Ellender Hose, MD   Encounter Date: 07/07/2020   PT End of Session - 07/07/20 1118    Visit Number 10    Number of Visits 17    Date for PT Re-Evaluation 07/18/20    Authorization Type Eval: 08/02    PT Start Time 1114    PT Stop Time 1145    PT Time Calculation (min) 31 min    Activity Tolerance Patient tolerated treatment well;No increased pain    Behavior During Therapy WFL for tasks assessed/performed           Past Medical History:  Diagnosis Date  . Cancer ([place])    Left breast with mast  . Hypertension   . IBS (irritable bowel syndrome)   . Migraines     Past Surgical History:  Procedure Laterality Date  . masectomy    . TONSILLECTOMY      There were no vitals filed for this visit.   Subjective Assessment - 07/07/20 1116    Subjective Patient reports feeling most of the discomfort when walking on concrete. She reports less pain when walking on other surfaces.    Pertinent History On May 5 patient was walking with her husband on a trail outside their home when she fell on the left side of her body and hit her head. Said she was going down a hill and could not stop and then fell. CT scan showed a small subarachnoid hemorrhage. She was followed expectantly with a follow-up CAT scan on June 5 that read showed resolution of the hemorrhage. She has pain in the lumbar spine and hip that travels down the leg. She reports that after she walks, her leg feels heavy like concrete with a burning sensation down the leg.    Limitations Walking    How long can you walk comfortably? 1/2 mile    Patient Stated  Goals Walk without pain    Currently in Pain? Yes    Pain Score 4     Pain Location Back    Pain Orientation Left    Pain Descriptors / Indicators Aching;Sore    Pain Type Chronic pain    Pain Onset More than a month ago    Pain Frequency Intermittent    Aggravating Factors  increased pain with standing/walking    Pain Relieving Factors rest/heat/stretches    Effect of Pain on Daily Activities decreased activity tolerance;    Multiple Pain Sites No                TREATMENT: Patient educated in HEP and expectations regarding plan of care and how symptoms can improve with adherence.   Patient hooklying with moist heat to low back; -lumbar trunk rotation x1 min each direction with cues to avoid painful ROM -posterior pelvic rock 5 sec hold x10 reps; -posterior pelvic rock With alternate march x10 reps With SLR hip flexion x10 reps;  Patient required min-moderate verbal/tactile cues for correct exercise technique and to increase core abdominal stabilization with UE/LE movement; initially patient was letting go of core abdominal activation but then with cues was able to increase posterior pelvic rock with  LE movement;   Instructed patient in outcome measures to address goals:  Lumbar AROM: Flexion:30 Extension:30 Lateral flexion: R:20   L:25   Patient tolerated session well. She reports less back pain. She does report fatigue in lower abdominals with advanced exercise;Patient did require frequent cues for increased core stabilization with LE movement;  Patient does exhibit improvement in lumbar AROM compared to initial evaluation;   Patient's condition has the potential to improve in response to therapy. Maximum improvement is yet to be obtained. The anticipated improvement is attainable and reasonable in a generally predictable time.  Patient reports adherence with HEP but admits she has hard time remembering core stabilization with LE exercise.                         PT Education - 07/07/20 1117    Education Details lumbar ROM, core strengthening, HEP    Person(s) Educated Patient    Methods Explanation;Verbal cues    Comprehension Verbalized understanding;Returned demonstration;Verbal cues required;Need further instruction            PT Short Term Goals - 07/07/20 1118      PT SHORT TERM GOAL #1   Title Pt will be independent with HEP in order to improve strength and decrease back pain in order to improve pain-free function at home.    Time 4    Period Weeks    Status Achieved    Target Date 06/21/20             PT Long Term Goals - 07/07/20 1119      PT LONG TERM GOAL #1   Title Pt will decrease worst back and hip pain as reported on NPRS by at least 2 points in order to demonstrate clinically significant reduction in back pain.    Baseline 08/02: 7/10, 9/16: 5/10    Time 8    Period Weeks    Status Partially Met    Target Date 07/18/20      PT LONG TERM GOAL #2   Title Pt will increase strength of by at least 1/2 MMT grade in order to demonstrate improvement in strength and function.    Baseline 8/02: see eval    Time 8    Period Weeks    Status Partially Met    Target Date 07/18/20      PT LONG TERM GOAL #3   Title Patient will increase FOTO score to equal to or greater than 64% to demonstrate statistically significant improvement in mobility and quality of life.    Baseline 08/02: 55, 9/16: 61%    Time 8    Status New    Target Date 07/18/20                 Plan - 07/07/20 1118    Clinical Impression Statement Patient late to session; She was instructed in advanced core strengthening and LE strengthening exercise. Patient continues to require cues for proper exercise technique and positioning, particularly to increase core stabilization with LE movement. Patient does report some reduction in back pain especially with lumbar flexion exercise. She continues to have  difficulty walking longer distance especially when on concrete and hard surfaces. Patient would benefit from additional skilled PT intervention to improve strength and mobility while reducing back pain;    Personal Factors and Comorbidities Age;Comorbidity 3+    Comorbidities Hypertension, IBS, anxiety    Examination-Activity Limitations Locomotion Level    Examination-Participation Restrictions Community  Activity    Stability/Clinical Decision Making Evolving/Moderate complexity    Rehab Potential Good    PT Frequency 2x / week    PT Duration 8 weeks    PT Treatment/Interventions ADLs/Self Care Home Management;Canalith Repostioning;Electrical Stimulation;Gait training;Stair training;Functional mobility training;Therapeutic activities;Therapeutic exercise;Balance training;Neuromuscular re-education;Patient/family education;Manual techniques;Passive range of motion;Dry needling;Visual/perceptual remediation/compensation;Spinal Manipulations;Joint Manipulations;Vestibular;Aquatic Therapy    PT Next Visit Plan Review HEP    PT Home Exercise Plan Medbridge: [email or web address]    Consulted and Agree with Plan of Care Patient           Patient will benefit from skilled therapeutic intervention in order to improve the following deficits and impairments:  Abnormal gait, Decreased activity tolerance, Decreased strength, Difficulty walking, Hypomobility, Dizziness  Visit Diagnosis: Pain in left hip  Radiculopathy, lumbar region  Difficulty in walking, not elsewhere classified     Problem List Patient Active Problem List   Diagnosis Date Noted  . Acute hip pain, left   . Hypertension   . Fall   . SAH (subarachnoid hemorrhage) ([place])   . IBS (irritable bowel syndrome)     Lenny Fiumara PT, DPT 07/07/2020, 11:43 AM  [place] MAIN [facility] SERVICES [address] [place], [place], [address] Phone: [phone]   Fax:  [phone]  Name: ALEXANDREA WESTERGARD MRN:  [id number] Date of Birth: 12-24-1940

## 2020-07-12 ENCOUNTER — Other Ambulatory Visit: Payer: Self-pay

## 2020-07-12 ENCOUNTER — Encounter: Payer: Self-pay | Admitting: Physical Therapy

## 2020-07-12 ENCOUNTER — Ambulatory Visit: Payer: Medicare Other | Admitting: Physical Therapy

## 2020-07-12 DIAGNOSIS — M25552 Pain in left hip: Secondary | ICD-10-CM | POA: Diagnosis not present

## 2020-07-12 DIAGNOSIS — R262 Difficulty in walking, not elsewhere classified: Secondary | ICD-10-CM

## 2020-07-12 DIAGNOSIS — M5416 Radiculopathy, lumbar region: Secondary | ICD-10-CM

## 2020-07-12 NOTE — Therapy (Signed)
[place] MAIN [facility] SERVICES [address] [place], [place], [address] Phone: [phone]   Fax:  [phone]  Physical Therapy Treatment  Patient Details  Name: Savannah Strong MRN: [id number] Date of Birth: 1941-04-03 Referring Provider (PT): Ellender Hose, MD   Encounter Date: 07/12/2020   PT End of Session - 07/12/20 1114    Visit Number 11    Number of Visits 17    Date for PT Re-Evaluation 07/18/20    Authorization Type Eval: 08/02    PT Start Time 1110    PT Stop Time 1145    PT Time Calculation (min) 35 min    Activity Tolerance Patient tolerated treatment well;No increased pain    Behavior During Therapy WFL for tasks assessed/performed           Past Medical History:  Diagnosis Date  . Cancer ([place])    Left breast with mast  . Hypertension   . IBS (irritable bowel syndrome)   . Migraines     Past Surgical History:  Procedure Laterality Date  . masectomy    . TONSILLECTOMY      There were no vitals filed for this visit.   Subjective Assessment - 07/12/20 1112    Subjective Patient reports doing well. She reports being able to walk a little farther with less pain. She reports when she walked out of the hospital last session she had no pain which was a big improvement;    Pertinent History On May 5 patient was walking with her husband on a trail outside their home when she fell on the left side of her body and hit her head. Said she was going down a hill and could not stop and then fell. CT scan showed a small subarachnoid hemorrhage. She was followed expectantly with a follow-up CAT scan on June 5 that read showed resolution of the hemorrhage. She has pain in the lumbar spine and hip that travels down the leg. She reports that after she walks, her leg feels heavy like concrete with a burning sensation down the leg.    Limitations Walking    How long can you walk comfortably? 1/2 mile    Patient Stated Goals Walk without  pain    Currently in Pain? Yes    Pain Score 2     Pain Location Back    Pain Orientation Left    Pain Descriptors / Indicators Aching;Sore    Pain Type Chronic pain    Pain Onset More than a month ago    Pain Frequency Intermittent    Aggravating Factors  increased pain with standing/walking    Pain Relieving Factors rest/heat/stretches    Effect of Pain on Daily Activities decreased activity tolerance;    Multiple Pain Sites No                TREATMENT: Patient educated in HEP and expectations regarding plan of care and how symptoms can improve with adherence.   Patient hooklying with moist heat to low back; -Lumbar trunk rotation x2 min each direction with cues to avoid painful ROM;  -single knee to chest stretch 20 sec hold x2 reps bilaterally; -posterior pelvic rock 5 sec hold x10 reps; -posterior pelvic rock With alternate march x15 reps With SLR x10 reps each LE  Alternate UE lift 2# handweight x10 reps each LE;   Suspended heel slides x5 reps each LE- patient required max VCs and tactile cues to keep posterior pelvic tilt  especially with leg extension;  Patient required min-moderate verbal/tactile cues for correct exercise technique and to increase core abdominal stabilization with UE/LE movement; initially patient was letting go of core abdominal activation but then with cues was able to increase posterior pelvic rock with LE movement;   -posterior pelvic rock with BLE leg lift hip/knee 90/90 5 sec hold x2-3 reps to challenge core stabilization;   Finished with lumbar trunk rotation x2 min each direction for increased lumbar ROM;   Patient tolerated session well. She reports less back pain. She does report fatigue in lower abdominals with advanced exercise;Patient did require frequent cues for increased core stabilization with LE movement;                        PT Education - 07/12/20 1114    Education Details  lumbar ROM, core strengthening, HEP    Person(s) Educated Patient    Methods Explanation;Verbal cues    Comprehension Verbalized understanding;Returned demonstration;Verbal cues required;Need further instruction            PT Short Term Goals - 07/07/20 1118      PT SHORT TERM GOAL #1   Title Pt will be independent with HEP in order to improve strength and decrease back pain in order to improve pain-free function at home.    Time 4    Period Weeks    Status Achieved    Target Date 06/21/20             PT Long Term Goals - 07/07/20 1119      PT LONG TERM GOAL #1   Title Pt will decrease worst back and hip pain as reported on NPRS by at least 2 points in order to demonstrate clinically significant reduction in back pain.    Baseline 08/02: 7/10, 9/16: 5/10    Time 8    Period Weeks    Status Partially Met    Target Date 07/18/20      PT LONG TERM GOAL #2   Title Pt will increase strength of by at least 1/2 MMT grade in order to demonstrate improvement in strength and function.    Baseline 8/02: see eval    Time 8    Period Weeks    Status Partially Met    Target Date 07/18/20      PT LONG TERM GOAL #3   Title Patient will increase FOTO score to equal to or greater than 64% to demonstrate statistically significant improvement in mobility and quality of life.    Baseline 08/02: 55, 9/16: 61%    Time 8    Status New    Target Date 07/18/20                 Plan - 07/12/20 1122    Clinical Impression Statement Patient late to session; Patient motivated and participated well within session. She was instructed in lumbar flexion exercise with core abdominal stabilization. She was able to exhibit better pelvic mobility and improved posterior pelvic tilt with LE movement this session compared to previous sessions. Patient does require min VCs for proper positioning for optimal muscle activation. Patient would benefit from additional skilled PT intervention to improve  strength and mobility;    Personal Factors and Comorbidities Age;Comorbidity 3+    Comorbidities Hypertension, IBS, anxiety    Examination-Activity Limitations Locomotion Level    Examination-Participation Restrictions Community Activity    Stability/Clinical Decision Making Evolving/Moderate complexity    Rehab Potential  Good    PT Frequency 2x / week    PT Duration 8 weeks    PT Treatment/Interventions ADLs/Self Care Home Management;Canalith Repostioning;Electrical Stimulation;Gait training;Stair training;Functional mobility training;Therapeutic activities;Therapeutic exercise;Balance training;Neuromuscular re-education;Patient/family education;Manual techniques;Passive range of motion;Dry needling;Visual/perceptual remediation/compensation;Spinal Manipulations;Joint Manipulations;Vestibular;Aquatic Therapy    PT Next Visit Plan Review HEP    PT Home Exercise Plan Medbridge: [email or web address]    Consulted and Agree with Plan of Care Patient           Patient will benefit from skilled therapeutic intervention in order to improve the following deficits and impairments:  Abnormal gait, Decreased activity tolerance, Decreased strength, Difficulty walking, Hypomobility, Dizziness  Visit Diagnosis: Pain in left hip  Radiculopathy, lumbar region  Difficulty in walking, not elsewhere classified     Problem List Patient Active Problem List   Diagnosis Date Noted  . Acute hip pain, left   . Hypertension   . Fall   . SAH (subarachnoid hemorrhage) ([place])   . IBS (irritable bowel syndrome)     Rontae Inglett PT, DPT 07/12/2020, 11:40 AM  [place] MAIN [facility] SERVICES [address] [place], [place], [address] Phone: [phone]   Fax:  [phone]  Name: Savannah Strong MRN: [id number] Date of Birth: 14-Mar-1941

## 2020-07-14 ENCOUNTER — Other Ambulatory Visit: Payer: Self-pay

## 2020-07-14 ENCOUNTER — Ambulatory Visit: Payer: Medicare Other | Admitting: Physical Therapy

## 2020-07-14 ENCOUNTER — Encounter: Payer: Self-pay | Admitting: Physical Therapy

## 2020-07-14 DIAGNOSIS — M25552 Pain in left hip: Secondary | ICD-10-CM

## 2020-07-14 DIAGNOSIS — M5416 Radiculopathy, lumbar region: Secondary | ICD-10-CM

## 2020-07-14 DIAGNOSIS — R262 Difficulty in walking, not elsewhere classified: Secondary | ICD-10-CM

## 2020-07-14 NOTE — Patient Instructions (Signed)
Access Code: [email or web address] URL: [email or web address] Date: 07/14/2020 Prepared by: Blanche East  Exercises Seated Figure 4 Piriformis Stretch - 1 x daily - 7 x weekly - 3 sets - 3 reps - 30 sec hold

## 2020-07-14 NOTE — Therapy (Signed)
[place] MAIN [facility] SERVICES [address] [place], [place], [address] Phone: [phone]   Fax:  [phone]  Physical Therapy Treatment  Patient Details  Name: Savannah Strong MRN: [id number] Date of Birth: 21-Jan-1941 Referring Provider (PT): Ellender Hose, MD   Encounter Date: 07/14/2020   PT End of Session - 07/14/20 1119    Visit Number 12    Number of Visits 17    Date for PT Re-Evaluation 07/18/20    Authorization Type Eval: 08/02    PT Start Time 1105    PT Stop Time 1150    PT Time Calculation (min) 45 min    Activity Tolerance Patient tolerated treatment well;No increased pain    Behavior During Therapy WFL for tasks assessed/performed           Past Medical History:  Diagnosis Date  . Cancer ([place])    Left breast with mast  . Hypertension   . IBS (irritable bowel syndrome)   . Migraines     Past Surgical History:  Procedure Laterality Date  . masectomy    . TONSILLECTOMY      There were no vitals filed for this visit.   Subjective Assessment - 07/14/20 1111    Subjective Patient reports increased soreness in left posterior hip and leg but denies any heaviness in LLE. She reports still feeling unsteady when walking;    Pertinent History On May 5 patient was walking with her husband on a trail outside their home when she fell on the left side of her body and hit her head. Said she was going down a hill and could not stop and then fell. CT scan showed a small subarachnoid hemorrhage. She was followed expectantly with a follow-up CAT scan on June 5 that read showed resolution of the hemorrhage. She has pain in the lumbar spine and hip that travels down the leg. She reports that after she walks, her leg feels heavy like concrete with a burning sensation down the leg.    Limitations Walking    How long can you walk comfortably? 1/2 mile    Patient Stated Goals Walk without pain    Currently in Pain? Yes    Pain Location Hip     Pain Orientation Left    Pain Descriptors / Indicators Aching;Sore    Pain Type Chronic pain    Pain Onset More than a month ago    Pain Frequency Intermittent    Aggravating Factors  increased pain with standing/walking    Pain Relieving Factors rest/heat/stretches    Effect of Pain on Daily Activities decreased activity tolerance;    Multiple Pain Sites No                TREATMENT: Patient hooklying with moist heat to low back; -Lumbar trunk rotation x2 min each direction with cues to avoid painful ROM;  -single knee to chest stretch 20 sec hold x2 reps bilaterally; -SLR hamstring stretch passive with ankle DF/PF for neural stretch x10 reps each LE;  Hooklying with pball under BLE: -posterior pelvic rock3 sec holdx10 reps; -double knee to chest stretch with overpressure 10 sec hold x5 reps;  PT performed long axis distraction to LLE 20 sec hold, 10 sec rest x5 bouts with good tolerance with patient reporting less hip/back discomfort;  Hooklying: Single knee to chest stretch 20 sec hold x2 reps LLE only Lumbar trunk rotation to right side to facilitate increased left lateral hip stretch x10 reps;  LLE piriformis stretch modified 30 sec hold x2 reps  Educated patient in how to do seated piriformis stretch 30 sec hold x2 reps with cues for proper positioning and exercise technique;    Patient tolerated session well. She reports less back/hip/LE pain. She does report fatigue in lower abdominals with advanced exercise;Patient provided with written HEP for better adherence with LE stretches, see patient instructions;                         PT Education - 07/14/20 1118    Education Details Lumbar ROM, core strengthening, HEP    Person(s) Educated Patient    Methods Explanation;Verbal cues    Comprehension Verbalized understanding;Returned demonstration;Verbal cues required;Need further instruction            PT Short Term Goals - 07/07/20  1118      PT SHORT TERM GOAL #1   Title Pt will be independent with HEP in order to improve strength and decrease back pain in order to improve pain-free function at home.    Time 4    Period Weeks    Status Achieved    Target Date 06/21/20             PT Long Term Goals - 07/07/20 1119      PT LONG TERM GOAL #1   Title Pt will decrease worst back and hip pain as reported on NPRS by at least 2 points in order to demonstrate clinically significant reduction in back pain.    Baseline 08/02: 7/10, 9/16: 5/10    Time 8    Period Weeks    Status Partially Met    Target Date 07/18/20      PT LONG TERM GOAL #2   Title Pt will increase strength of by at least 1/2 MMT grade in order to demonstrate improvement in strength and function.    Baseline 8/02: see eval    Time 8    Period Weeks    Status Partially Met    Target Date 07/18/20      PT LONG TERM GOAL #3   Title Patient will increase FOTO score to equal to or greater than 64% to demonstrate statistically significant improvement in mobility and quality of life.    Baseline 08/02: 55, 9/16: 61%    Time 8    Status New    Target Date 07/18/20                 Plan - 07/14/20 1200    Clinical Impression Statement Patient motivated and participated well within session. She reports increased stiffness and soreness down LLE this session compared to previous sessions. Instructed patient in lumbar flexion stretches including SLR hamstring neural stretch to facilitate better tissue extensibility; Patient does require min VCs for proper positioning/exercise technique. She tolerated session well reporting less pain at end of session. She would benefit from additional skilled PT Intervention to improve strength and mobility while reducing pain;    Personal Factors and Comorbidities Age;Comorbidity 3+    Comorbidities Hypertension, IBS, anxiety    Examination-Activity Limitations Locomotion Level    Examination-Participation  Restrictions Community Activity    Stability/Clinical Decision Making Evolving/Moderate complexity    Rehab Potential Good    PT Frequency 2x / week    PT Duration 8 weeks    PT Treatment/Interventions ADLs/Self Care Home Management;Canalith Repostioning;Electrical Stimulation;Gait training;Stair training;Functional mobility training;Therapeutic activities;Therapeutic exercise;Balance training;Neuromuscular re-education;Patient/family education;Manual techniques;Passive range of motion;Dry needling;Visual/perceptual  remediation/compensation;Spinal Manipulations;Joint Manipulations;Vestibular;Aquatic Therapy    PT Next Visit Plan Review HEP    PT Home Exercise Plan Medbridge: [email or web address]    Consulted and Agree with Plan of Care Patient           Patient will benefit from skilled therapeutic intervention in order to improve the following deficits and impairments:  Abnormal gait, Decreased activity tolerance, Decreased strength, Difficulty walking, Hypomobility, Dizziness  Visit Diagnosis: Pain in left hip  Radiculopathy, lumbar region  Difficulty in walking, not elsewhere classified     Problem List Patient Active Problem List   Diagnosis Date Noted  . Acute hip pain, left   . Hypertension   . Fall   . SAH (subarachnoid hemorrhage) ([place])   . IBS (irritable bowel syndrome)     Jalilah Wiltsie PT, DPT 07/14/2020, 12:02 PM  [place] MAIN [facility] SERVICES [address] [place], [place], [address] Phone: [phone]   Fax:  [phone]  Name: CATTALEYA WIEN MRN: [id number] Date of Birth: 10-04-1941

## 2020-07-18 ENCOUNTER — Ambulatory Visit: Payer: Medicare Other | Admitting: Physical Therapy

## 2020-07-19 ENCOUNTER — Ambulatory Visit: Payer: Medicare Other | Admitting: Physical Therapy

## 2020-07-21 ENCOUNTER — Ambulatory Visit: Payer: Medicare Other | Admitting: Physical Therapy

## 2020-07-21 ENCOUNTER — Encounter: Payer: Self-pay | Admitting: Physical Therapy

## 2020-07-21 ENCOUNTER — Other Ambulatory Visit: Payer: Self-pay

## 2020-07-21 DIAGNOSIS — M25552 Pain in left hip: Secondary | ICD-10-CM

## 2020-07-21 DIAGNOSIS — M5416 Radiculopathy, lumbar region: Secondary | ICD-10-CM

## 2020-07-21 DIAGNOSIS — R262 Difficulty in walking, not elsewhere classified: Secondary | ICD-10-CM

## 2020-07-21 NOTE — Therapy (Signed)
[place] MAIN [facility] SERVICES [address] [place], [place], [address] Phone: [phone]   Fax:  [phone]  Physical Therapy Treatment  Patient Details  Name: Savannah Strong MRN: [id number] Date of Birth: 08/07/1941 Referring Provider (PT): Ellender Hose, MD   Encounter Date: 07/21/2020   PT End of Session - 07/21/20 1119    Visit Number 13    Number of Visits 25    Date for PT Re-Evaluation 08/18/20    Authorization Type Eval: 08/02    PT Start Time 1110    PT Stop Time 1145    PT Time Calculation (min) 35 min    Activity Tolerance Patient tolerated treatment well;No increased pain    Behavior During Therapy WFL for tasks assessed/performed           Past Medical History:  Diagnosis Date  . Cancer ([place])    Left breast with mast  . Hypertension   . IBS (irritable bowel syndrome)   . Migraines     Past Surgical History:  Procedure Laterality Date  . masectomy    . TONSILLECTOMY      There were no vitals filed for this visit.   Subjective Assessment - 07/21/20 1117    Subjective Patient reports, "I am sorry I am late. I actually got up early today but I still can't get here early enough."    Pertinent History On May 5 patient was walking with her husband on a trail outside their home when she fell on the left side of her body and hit her head. Said she was going down a hill and could not stop and then fell. CT scan showed a small subarachnoid hemorrhage. She was followed expectantly with a follow-up CAT scan on June 5 that read showed resolution of the hemorrhage. She has pain in the lumbar spine and hip that travels down the leg. She reports that after she walks, her leg feels heavy like concrete with a burning sensation down the leg.    Limitations Walking    How long can you walk comfortably? 1/2 mile    Patient Stated Goals Walk without pain    Currently in Pain? Yes    Pain Score 3     Pain Location Hip    Pain  Orientation Left    Pain Descriptors / Indicators Aching;Sore    Pain Type Chronic pain    Pain Onset More than a month ago    Pain Frequency Intermittent    Aggravating Factors  increased pain with standing/walking    Pain Relieving Factors rest/heat/stretches    Effect of Pain on Daily Activities decreased activity tolerance;             TREATMENT: Patient hooklying with moist heat to low back; -lumbar trunk rotation x5 reps each direction;  -single knee to chest stretch 20 sec hold x2 reps bilaterally; -SLR hamstring stretch passive with ankle DF/PF for neural stretch x20 reps each LE;  -posterior pelvic rock5 sec holdx10 reps;  Posterior pelvic tilt with alternate march x10 reps each;  Posterior pelvic tilt with SLR x10 reps;   Patient tolerated session well. She reports less back/hip/LE pain. She does report fatigue in lower abdominals with advanced exercise;                          PT Education - 07/21/20 1118    Education Details lumbar ROM, core strengthening, HEP  Person(s) Educated Patient    Methods Explanation;Verbal cues    Comprehension Verbalized understanding;Returned demonstration;Verbal cues required;Need further instruction            PT Short Term Goals - 07/07/20 1118      PT SHORT TERM GOAL #1   Title Pt will be independent with HEP in order to improve strength and decrease back pain in order to improve pain-free function at home.    Time 4    Period Weeks    Status Achieved    Target Date 06/21/20             PT Long Term Goals - 07/21/20 1120      PT LONG TERM GOAL #1   Title Pt will decrease worst back and hip pain as reported on NPRS by at least 2 points in order to demonstrate clinically significant reduction in back pain.    Baseline 08/02: 7/10, 9/16: 5/10    Time 4    Period Weeks    Status Partially Met    Target Date 08/18/20      PT LONG TERM GOAL #2   Title Pt will increase strength of by at  least 1/2 MMT grade in order to demonstrate improvement in strength and function.    Baseline 8/02: see eval    Time 4    Period Weeks    Status Partially Met    Target Date 08/18/20      PT LONG TERM GOAL #3   Title Patient will increase FOTO score to equal to or greater than 64% to demonstrate statistically significant improvement in mobility and quality of life.    Baseline 08/02: 55, 9/16: 61%    Time 4    Status New    Target Date 08/18/20                 Plan - 07/21/20 1121    Clinical Impression Statement Patient late to session; She was instructed in advanced core strengthening/lumbar flexion exercise for better core stabilization and to reduce back pain; She does require min VCs for proper exercise technique/positioning. She reports she has been walking more at home and has increased her distance of walking up to 1/2 mile. She reports she still feels pain with long distance walking but does feel like she is able to walk longer with less discomfort. Her goals were recently assessed a few weeks ago. She is making improvements with improved lumbar ROM and reports less stiffness. She would benefit from additional skilled PT intervention to improve strength and mobility while reducing back pain;    Personal Factors and Comorbidities Age;Comorbidity 3+    Comorbidities Hypertension, IBS, anxiety    Examination-Activity Limitations Locomotion Level    Examination-Participation Restrictions Community Activity    Stability/Clinical Decision Making Evolving/Moderate complexity    Rehab Potential Good    PT Frequency 2x / week    PT Duration 8 weeks    PT Treatment/Interventions ADLs/Self Care Home Management;Canalith Repostioning;Electrical Stimulation;Gait training;Stair training;Functional mobility training;Therapeutic activities;Therapeutic exercise;Balance training;Neuromuscular re-education;Patient/family education;Manual techniques;Passive range of motion;Dry  needling;Visual/perceptual remediation/compensation;Spinal Manipulations;Joint Manipulations;Vestibular;Aquatic Therapy    PT Next Visit Plan Review HEP    PT Home Exercise Plan Medbridge: [email or web address]    Consulted and Agree with Plan of Care Patient           Patient will benefit from skilled therapeutic intervention in order to improve the following deficits and impairments:  Abnormal gait, Decreased activity tolerance, Decreased strength, Difficulty walking, Hypomobility,  Dizziness  Visit Diagnosis: Pain in left hip - Plan: PT plan of care cert/re-cert  Radiculopathy, lumbar region - Plan: PT plan of care cert/re-cert  Difficulty in walking, not elsewhere classified - Plan: PT plan of care cert/re-cert     Problem List Patient Active Problem List   Diagnosis Date Noted  . Acute hip pain, left   . Hypertension   . Fall   . SAH (subarachnoid hemorrhage) ([place])   . IBS (irritable bowel syndrome)     Talha Iser PT, DPT 07/21/2020, 11:44 AM  [place] MAIN [facility] SERVICES [address] [place], [place], [address] Phone: [phone]   Fax:  [phone]  Name: ALVENA KIERNAN MRN: [id number] Date of Birth: 1941-01-06

## 2020-07-26 ENCOUNTER — Ambulatory Visit: Payer: Medicare Other | Admitting: Physical Therapy

## 2020-07-28 ENCOUNTER — Ambulatory Visit: Payer: Medicare Other | Attending: Internal Medicine | Admitting: Physical Therapy

## 2020-07-28 ENCOUNTER — Encounter: Payer: Self-pay | Admitting: Physical Therapy

## 2020-07-28 ENCOUNTER — Other Ambulatory Visit: Payer: Self-pay

## 2020-07-28 DIAGNOSIS — R262 Difficulty in walking, not elsewhere classified: Secondary | ICD-10-CM | POA: Diagnosis present

## 2020-07-28 DIAGNOSIS — M5416 Radiculopathy, lumbar region: Secondary | ICD-10-CM | POA: Insufficient documentation

## 2020-07-28 DIAGNOSIS — M25552 Pain in left hip: Secondary | ICD-10-CM | POA: Insufficient documentation

## 2020-07-28 NOTE — Therapy (Signed)
[place] MAIN [facility] SERVICES [address] [place], [place], [address] Phone: [phone]   Fax:  [phone]  Physical Therapy Treatment  Patient Details  Name: Savannah Strong MRN: [id number] Date of Birth: 06/03/1941 Referring Provider (PT): Ellender Hose, MD   Encounter Date: 07/28/2020   PT End of Session - 07/28/20 1115    Visit Number 14    Number of Visits 25    Date for PT Re-Evaluation 08/18/20    Authorization Type Eval: 08/02    PT Start Time 1105    PT Stop Time 1140    PT Time Calculation (min) 35 min    Activity Tolerance Patient tolerated treatment well;No increased pain    Behavior During Therapy WFL for tasks assessed/performed           Past Medical History:  Diagnosis Date  . Cancer ([place])    Left breast with mast  . Hypertension   . IBS (irritable bowel syndrome)   . Migraines     Past Surgical History:  Procedure Laterality Date  . masectomy    . TONSILLECTOMY      There were no vitals filed for this visit.   Subjective Assessment - 07/28/20 1112    Subjective Patient reports feeling better. She reports, "my back is better. I am not having as much pain down the left leg but I am a little sore in the back of my left hip."    Pertinent History On May 5 patient was walking with her husband on a trail outside their home when she fell on the left side of her body and hit her head. Said she was going down a hill and could not stop and then fell. CT scan showed a small subarachnoid hemorrhage. She was followed expectantly with a follow-up CAT scan on June 5 that read showed resolution of the hemorrhage. She has pain in the lumbar spine and hip that travels down the leg. She reports that after she walks, her leg feels heavy like concrete with a burning sensation down the leg.    Limitations Walking    How long can you walk comfortably? 1/2 mile    Patient Stated Goals Walk without pain    Currently in Pain? Yes     Pain Score 1     Pain Location Hip    Pain Orientation Left;Posterior    Pain Descriptors / Indicators Aching    Pain Type Chronic pain    Pain Onset More than a month ago    Pain Frequency Intermittent    Aggravating Factors  increased pain with standing/walking    Pain Relieving Factors rest/heat/stretches    Effect of Pain on Daily Activities decreased activity tolerance;    Multiple Pain Sites No               TREATMENT: Patient hooklying with moist heat to low back; -lumbar trunk rotation x10 reps each direction;  -single knee to chest stretch 20 sec hold x2 reps bilaterally; -SLR hamstring stretch passive with ankle DF/PF for neural stretch x20 reps each LE;  -posterior pelvic rock5sec holdx10 reps- required min VCs for proper exercise technique;              Posterior pelvic tilt with SLR x10 reps;  Instructed patient in advanced LE strengthening for better activity tolerance: -bridges with arms by side x15 reps;   Standing with red tband around BLE: -hip abduction x10 reps each LE -hip extension  x10 reps each LE -side stepping x10 feet x2 laps each direction; Patient required min-moderate verbal/tactile cues for correct exercise technique including cues for proper positioning for less back pain;   Patient tolerated session well. She reports less back/hip/LEpain. She does report fatigue in lower abdominals with advanced exercise;    Patient did have to leave early for appointment. Session cut short due to patient request.                        PT Education - 07/28/20 1113    Education Details lumbar ROM, core/LE strengthening, HEP    Person(s) Educated Patient    Methods Explanation;Verbal cues    Comprehension Verbalized understanding;Returned demonstration;Verbal cues required;Need further instruction            PT Short Term Goals - 07/07/20 1118      PT SHORT TERM GOAL #1   Title Pt will be independent with HEP in order to  improve strength and decrease back pain in order to improve pain-free function at home.    Time 4    Period Weeks    Status Achieved    Target Date 06/21/20             PT Long Term Goals - 07/21/20 1120      PT LONG TERM GOAL #1   Title Pt will decrease worst back and hip pain as reported on NPRS by at least 2 points in order to demonstrate clinically significant reduction in back pain.    Baseline 08/02: 7/10, 9/16: 5/10    Time 4    Period Weeks    Status Partially Met    Target Date 08/18/20      PT LONG TERM GOAL #2   Title Pt will increase strength of by at least 1/2 MMT grade in order to demonstrate improvement in strength and function.    Baseline 8/02: see eval    Time 4    Period Weeks    Status Partially Met    Target Date 08/18/20      PT LONG TERM GOAL #3   Title Patient will increase FOTO score to equal to or greater than 64% to demonstrate statistically significant improvement in mobility and quality of life.    Baseline 08/02: 55, 9/16: 61%    Time 4    Status New    Target Date 08/18/20                 Plan - 07/28/20 1119    Clinical Impression Statement Patient motivated and participated well within session. She reports overall reduction in low back pain. Patient instructed in lumbar flexion exercise as warm up to facilitate better lumbar ROM and reduce stiffness. She was instructed in advanced LE strengthening exercise to improve activity tolerance and stance control. She does require min VCs for proper exercise technique for optimal strengthening. Patient denies any increase in pain with advanced exercise. She would benefit from additional skilled PT intervention to improve strength, balance and mobility while reducing back pain;    Personal Factors and Comorbidities Age;Comorbidity 3+    Comorbidities Hypertension, IBS, anxiety    Examination-Activity Limitations Locomotion Level    Examination-Participation Restrictions Community Activity     Stability/Clinical Decision Making Evolving/Moderate complexity    Rehab Potential Good    PT Frequency 2x / week    PT Duration 8 weeks    PT Treatment/Interventions ADLs/Self Care Home Management;Canalith Repostioning;Electrical Stimulation;Gait training;Stair  training;Functional mobility training;Therapeutic activities;Therapeutic exercise;Balance training;Neuromuscular re-education;Patient/family education;Manual techniques;Passive range of motion;Dry needling;Visual/perceptual remediation/compensation;Spinal Manipulations;Joint Manipulations;Vestibular;Aquatic Therapy    PT Next Visit Plan Review HEP    PT Home Exercise Plan Medbridge: [email or web address]    Consulted and Agree with Plan of Care Patient           Patient will benefit from skilled therapeutic intervention in order to improve the following deficits and impairments:  Abnormal gait, Decreased activity tolerance, Decreased strength, Difficulty walking, Hypomobility, Dizziness  Visit Diagnosis: Pain in left hip  Radiculopathy, lumbar region  Difficulty in walking, not elsewhere classified     Problem List Patient Active Problem List   Diagnosis Date Noted  . Acute hip pain, left   . Hypertension   . Fall   . SAH (subarachnoid hemorrhage) ([place])   . IBS (irritable bowel syndrome)     Ustin Cruickshank PT, DPT 07/28/2020, 11:41 AM  [place] MAIN [facility] SERVICES [address] [place], [place], [address] Phone: [phone]   Fax:  [phone]  Name: AZALYNN MAXIM MRN: [id number] Date of Birth: 1941-05-06

## 2020-07-28 NOTE — Patient Instructions (Signed)
Access Code: [email or web address] URL: [email or web address] Date: 07/28/2020 Prepared by: Blanche East  Exercises Standing Hip Extension with Resistance at Ankles and Counter Support - 1 x daily - 7 x weekly - 2 sets - 10 reps Standing Hip Abduction with Resistance at Ankles and Counter Support - 1 x daily - 7 x weekly - 2 sets - 10 reps Side Stepping with Resistance at Ankles and Counter Support - 1 x daily - 7 x weekly - 2 sets - 2-5 reps

## 2020-08-01 ENCOUNTER — Ambulatory Visit: Payer: Medicare Other | Admitting: Physical Therapy

## 2020-08-02 ENCOUNTER — Ambulatory Visit: Payer: Medicare Other | Admitting: Physical Therapy

## 2020-08-03 ENCOUNTER — Encounter: Payer: Medicare Other | Admitting: Physical Therapy

## 2020-08-04 ENCOUNTER — Other Ambulatory Visit: Payer: Self-pay

## 2020-08-04 ENCOUNTER — Encounter: Payer: Self-pay | Admitting: Physical Therapy

## 2020-08-04 ENCOUNTER — Ambulatory Visit: Payer: Medicare Other | Admitting: Physical Therapy

## 2020-08-04 DIAGNOSIS — R262 Difficulty in walking, not elsewhere classified: Secondary | ICD-10-CM

## 2020-08-04 DIAGNOSIS — M25552 Pain in left hip: Secondary | ICD-10-CM | POA: Diagnosis not present

## 2020-08-04 DIAGNOSIS — M5416 Radiculopathy, lumbar region: Secondary | ICD-10-CM

## 2020-08-04 NOTE — Therapy (Signed)
[place] MAIN [facility] SERVICES [address] [place], [place], [address] Phone: [phone]   Fax:  [phone]  Physical Therapy Treatment  Patient Details  Name: Savannah Strong MRN: [id number] Date of Birth: January 02, 1941 Referring Provider (PT): Ellender Hose, MD   Encounter Date: 08/04/2020   PT End of Session - 08/04/20 1110    Visit Number 15    Number of Visits 25    Date for PT Re-Evaluation 08/18/20    Authorization Type Eval: 08/02    PT Start Time 1102    PT Stop Time 1145    PT Time Calculation (min) 43 min    Activity Tolerance Patient tolerated treatment well;No increased pain    Behavior During Therapy WFL for tasks assessed/performed           Past Medical History:  Diagnosis Date  . Cancer ([place])    Left breast with mast  . Hypertension   . IBS (irritable bowel syndrome)   . Migraines     Past Surgical History:  Procedure Laterality Date  . masectomy    . TONSILLECTOMY      There were no vitals filed for this visit.   Subjective Assessment - 08/04/20 1109    Subjective Patient reports feeling better. She denies any back/LE pain;    Pertinent History On May 5 patient was walking with her husband on a trail outside their home when she fell on the left side of her body and hit her head. Said she was going down a hill and could not stop and then fell. CT scan showed a small subarachnoid hemorrhage. She was followed expectantly with a follow-up CAT scan on June 5 that read showed resolution of the hemorrhage. She has pain in the lumbar spine and hip that travels down the leg. She reports that after she walks, her leg feels heavy like concrete with a burning sensation down the leg.    Limitations Walking    How long can you walk comfortably? 1/2 mile    Patient Stated Goals Walk without pain    Currently in Pain? No/denies    Pain Onset More than a month ago    Multiple Pain Sites No             TREATMENT: Warm  up on Nustep BUE/BLE level 2 x4 min (Unbilled) concurrent with moist heat to low back  Standing with green tband around BLE: -hip abduction x10 reps each LE -hip extension x10 reps each LE -side stepping x10 feet x2 laps each direction; Patient required min-moderate verbal/tactile cues for correct exercise technique including cues for proper positioning for less back pain;   Standing in parallel bars: -Single leg heel raises x10 reps each LE with B rail assist;  Forward/backward step over orange hurdle with 1-0 rail assist x10 reps with min A for safety;  Leg press, BLE plate 40# [id number] with min VCs for proper positioning and to slow down LE movement for better motor control;   Standing on 1/2 bolster: -feet apart, heel/toe rocks x15 reps; -feet apart, neutral position with BUE wand flexion x10 reps with min A for safety -tandem stance with 2-0 rail assist, 10 sec hold x2 reps;  Patient required min VCs for balance stability, including to increase trunk control for less loss of balance with smaller base of support  Patient tolerated session well. She reports less back/hip/LEpain. She tolerated advanced standing exercise well without increase in pain. She does require min  VCs for proper exercise technique for optimal strengthening;                            PT Education - 08/04/20 1109    Education Details LE strengthening, balance, HEP    Person(s) Educated Patient    Methods Explanation;Verbal cues    Comprehension Verbalized understanding;Returned demonstration;Verbal cues required;Need further instruction            PT Short Term Goals - 07/07/20 1118      PT SHORT TERM GOAL #1   Title Pt will be independent with HEP in order to improve strength and decrease back pain in order to improve pain-free function at home.    Time 4    Period Weeks    Status Achieved    Target Date 06/21/20             PT Long Term Goals - 07/21/20 1120      PT LONG  TERM GOAL #1   Title Pt will decrease worst back and hip pain as reported on NPRS by at least 2 points in order to demonstrate clinically significant reduction in back pain.    Baseline 08/02: 7/10, 9/16: 5/10    Time 4    Period Weeks    Status Partially Met    Target Date 08/18/20      PT LONG TERM GOAL #2   Title Pt will increase strength of by at least 1/2 MMT grade in order to demonstrate improvement in strength and function.    Baseline 8/02: see eval    Time 4    Period Weeks    Status Partially Met    Target Date 08/18/20      PT LONG TERM GOAL #3   Title Patient will increase FOTO score to equal to or greater than 64% to demonstrate statistically significant improvement in mobility and quality of life.    Baseline 08/02: 55, 9/16: 61%    Time 4    Status New    Target Date 08/18/20                 Plan - 08/04/20 1140    Clinical Impression Statement Patient motivated and participated well within session. She was instructed in advanced LE strengthening with increased standing exercise. Patient does require min VCs for proper exercise technique and positioning. She denies any increase in back pain with standing exercise. Patient was also instructed in balance exercise to improve ankle control and stance stability. She does have difficulty with standing on compliant surface especially with reduced rail assist. Patient would benefit from additional skilled PT intervention to improve strength, balance and mobility;    Personal Factors and Comorbidities Age;Comorbidity 3+    Comorbidities Hypertension, IBS, anxiety    Examination-Activity Limitations Locomotion Level    Examination-Participation Restrictions Community Activity    Stability/Clinical Decision Making Evolving/Moderate complexity    Rehab Potential Good    PT Frequency 2x / week    PT Duration 8 weeks    PT Treatment/Interventions ADLs/Self Care Home Management;Canalith Repostioning;Electrical  Stimulation;Gait training;Stair training;Functional mobility training;Therapeutic activities;Therapeutic exercise;Balance training;Neuromuscular re-education;Patient/family education;Manual techniques;Passive range of motion;Dry needling;Visual/perceptual remediation/compensation;Spinal Manipulations;Joint Manipulations;Vestibular;Aquatic Therapy    PT Next Visit Plan Review HEP    PT Home Exercise Plan Medbridge: [email or web address]    Consulted and Agree with Plan of Care Patient           Patient will benefit from skilled therapeutic  intervention in order to improve the following deficits and impairments:  Abnormal gait, Decreased activity tolerance, Decreased strength, Difficulty walking, Hypomobility, Dizziness  Visit Diagnosis: Pain in left hip  Radiculopathy, lumbar region  Difficulty in walking, not elsewhere classified     Problem List Patient Active Problem List   Diagnosis Date Noted  . Acute hip pain, left   . Hypertension   . Fall   . SAH (subarachnoid hemorrhage) ([place])   . IBS (irritable bowel syndrome)     Anique Beckley PT, DPT 08/04/2020, 11:46 AM  [place] MAIN [facility] SERVICES [address] [place], [place], [address] Phone: [phone]   Fax:  [phone]  Name: Savannah Strong MRN: [id number] Date of Birth: 17-Jun-1941

## 2020-08-08 ENCOUNTER — Ambulatory Visit: Payer: Medicare Other | Admitting: Physical Therapy

## 2020-08-10 ENCOUNTER — Other Ambulatory Visit: Payer: Self-pay

## 2020-08-10 ENCOUNTER — Encounter: Payer: Self-pay | Admitting: Physical Therapy

## 2020-08-10 ENCOUNTER — Ambulatory Visit: Payer: Medicare Other | Admitting: Physical Therapy

## 2020-08-10 DIAGNOSIS — M5416 Radiculopathy, lumbar region: Secondary | ICD-10-CM

## 2020-08-10 DIAGNOSIS — M25552 Pain in left hip: Secondary | ICD-10-CM | POA: Diagnosis not present

## 2020-08-10 DIAGNOSIS — R262 Difficulty in walking, not elsewhere classified: Secondary | ICD-10-CM

## 2020-08-10 NOTE — Patient Instructions (Addendum)
Pelvic Anterior / Posterior Tilt / Pelvic Rock    Sitting in chair with back supported  Breathe in -relaxing stomach and arching low back as in picture A  Then blow air out (exhale) and pull stomach muscles in rounding low back against back of chair (picture B)  Repeat 10 times as needed for low back pain;

## 2020-08-10 NOTE — Therapy (Signed)
[place] MAIN [facility] SERVICES [address] [place], [place], [address] Phone: [phone]   Fax:  [phone]  Physical Therapy Treatment  Patient Details  Name: Savannah Strong MRN: [id number] Date of Birth: 1940-11-03 Referring Provider (PT): Ellender Hose, MD   Encounter Date: 08/10/2020   PT End of Session - 08/10/20 0942    Visit Number 16    Number of Visits 25    Date for PT Re-Evaluation 08/18/20    Authorization Type Eval: 08/02    PT Start Time 0933    PT Stop Time 1015    PT Time Calculation (min) 42 min    Equipment Utilized During Treatment Gait belt    Activity Tolerance Patient tolerated treatment well;No increased pain    Behavior During Therapy WFL for tasks assessed/performed           Past Medical History:  Diagnosis Date  . Cancer ([place])    Left breast with mast  . Hypertension   . IBS (irritable bowel syndrome)   . Migraines     Past Surgical History:  Procedure Laterality Date  . masectomy    . TONSILLECTOMY      There were no vitals filed for this visit.   Subjective Assessment - 08/10/20 0941    Subjective Patient reports doing well. She denies any pain currently; She reports not doing as much exercise because she has been busier. She has been walking more and it is going better. She is not having as much pain or heaviness in LLE;    Pertinent History On May 5 patient was walking with her husband on a trail outside their home when she fell on the left side of her body and hit her head. Said she was going down a hill and could not stop and then fell. CT scan showed a small subarachnoid hemorrhage. She was followed expectantly with a follow-up CAT scan on June 5 that read showed resolution of the hemorrhage. She has pain in the lumbar spine and hip that travels down the leg. She reports that after she walks, her leg feels heavy like concrete with a burning sensation down the leg.    Limitations Walking    How  long can you walk comfortably? 1/2 mile    Patient Stated Goals Walk without pain    Currently in Pain? No/denies    Pain Onset More than a month ago    Multiple Pain Sites No                TREATMENT: Warm up on crosstrainer level 2 x4 min (Unbilled)   Standing with green tband around BLE: -hip abduction x12 reps each LE -hip extension x12 reps each LE -side stepping x5 feet x4 laps each direction; Patient required min-moderate verbal/tactile cues for correct exercise techniqueincluding cues for proper positioning for less back pain;  Seated: Piriformis stretch 20 sec hold x1 rep each Posterior pelvic rocks x10 reps with mod Vcs for proper positioning to isolate lower abdominal muscles and improve pelvic tilt;  Leg press, BLE plate 40# [id number] with min VCs for proper positioning and to slow down LE movement for better motor control;   NMR:  Resisted walking 12.5# forward/backward, side step x2 way, x2 laps each with CGA for safety and min VCs for weight shift for balance control;   Patient tolerated session well. She reports less back/hip/LEpain. She tolerated advanced standing exercise well without increase in pain. She does require min  VCs for proper exercise technique for optimal strengthening; Patient denies any increase in pain with advanced exercise.                         PT Education - 08/10/20 [phone]    Education Details LE strengthening, balance, HEP    Person(s) Educated Patient    Methods Explanation;Verbal cues    Comprehension Verbalized understanding;Returned demonstration;Verbal cues required;Need further instruction            PT Short Term Goals - 07/07/20 1118      PT SHORT TERM GOAL #1   Title Pt will be independent with HEP in order to improve strength and decrease back pain in order to improve pain-free function at home.    Time 4    Period Weeks    Status Achieved    Target Date 06/21/20             PT Long  Term Goals - 07/21/20 1120      PT LONG TERM GOAL #1   Title Pt will decrease worst back and hip pain as reported on NPRS by at least 2 points in order to demonstrate clinically significant reduction in back pain.    Baseline 08/02: 7/10, 9/16: 5/10    Time 4    Period Weeks    Status Partially Met    Target Date 08/18/20      PT LONG TERM GOAL #2   Title Pt will increase strength of by at least 1/2 MMT grade in order to demonstrate improvement in strength and function.    Baseline 8/02: see eval    Time 4    Period Weeks    Status Partially Met    Target Date 08/18/20      PT LONG TERM GOAL #3   Title Patient will increase FOTO score to equal to or greater than 64% to demonstrate statistically significant improvement in mobility and quality of life.    Baseline 08/02: 55, 9/16: 61%    Time 4    Status New    Target Date 08/18/20                 Plan - 08/10/20 0947    Clinical Impression Statement Patient motivated and participated well within session. She was instructed in advanced LE strengthening with increased standing exercise. Patient does require min VCs for proper exercise technique and positioning. She also required increased cues for seated posterior pelvic tilt for proper core strengthening; She denies any increase in back pain with standing exercise. Patient was also instructed in balance exercise to improve gait safety with resisted walking.  Patient would benefit from additional skilled PT intervention to improve strength, balance and mobility;    Personal Factors and Comorbidities Age;Comorbidity 3+    Comorbidities Hypertension, IBS, anxiety    Examination-Activity Limitations Locomotion Level    Examination-Participation Restrictions Community Activity    Stability/Clinical Decision Making Evolving/Moderate complexity    Rehab Potential Good    PT Frequency 2x / week    PT Duration 8 weeks    PT Treatment/Interventions ADLs/Self Care Home  Management;Canalith Repostioning;Electrical Stimulation;Gait training;Stair training;Functional mobility training;Therapeutic activities;Therapeutic exercise;Balance training;Neuromuscular re-education;Patient/family education;Manual techniques;Passive range of motion;Dry needling;Visual/perceptual remediation/compensation;Spinal Manipulations;Joint Manipulations;Vestibular;Aquatic Therapy    PT Next Visit Plan Review HEP    PT Home Exercise Plan Medbridge: [email or web address]    Consulted and Agree with Plan of Care Patient  Patient will benefit from skilled therapeutic intervention in order to improve the following deficits and impairments:  Abnormal gait, Decreased activity tolerance, Decreased strength, Difficulty walking, Hypomobility, Dizziness  Visit Diagnosis: Pain in left hip  Radiculopathy, lumbar region  Difficulty in walking, not elsewhere classified     Problem List Patient Active Problem List   Diagnosis Date Noted  . Acute hip pain, left   . Hypertension   . Fall   . SAH (subarachnoid hemorrhage) ([place])   . IBS (irritable bowel syndrome)     Cyndee Giammarco PT, DPT 08/10/2020, 10:19 AM  [place] MAIN [facility] SERVICES [address] [place], [place], [address] Phone: [phone]   Fax:  [phone]  Name: Savannah Strong MRN: [id number] Date of Birth: 1941/08/29

## 2020-08-15 ENCOUNTER — Encounter: Payer: Self-pay | Admitting: Physical Therapy

## 2020-08-15 ENCOUNTER — Other Ambulatory Visit: Payer: Self-pay

## 2020-08-15 ENCOUNTER — Ambulatory Visit: Payer: Medicare Other | Admitting: Physical Therapy

## 2020-08-15 DIAGNOSIS — M5416 Radiculopathy, lumbar region: Secondary | ICD-10-CM

## 2020-08-15 DIAGNOSIS — M25552 Pain in left hip: Secondary | ICD-10-CM | POA: Diagnosis not present

## 2020-08-15 DIAGNOSIS — R262 Difficulty in walking, not elsewhere classified: Secondary | ICD-10-CM

## 2020-08-15 NOTE — Therapy (Signed)
[place] MAIN [facility] SERVICES [address] [place], [place], [address] Phone: [phone]   Fax:  [phone]  Physical Therapy Treatment  Patient Details  Name: Savannah Strong MRN: [id number] Date of Birth: 11-25-1940 Referring Provider (PT): Ellender Hose, MD   Encounter Date: 08/15/2020   PT End of Session - 08/15/20 1025    Visit Number 17    Number of Visits 25    Date for PT Re-Evaluation 08/18/20    Authorization Type Eval: 08/02    PT Start Time 1018    PT Stop Time 1100    PT Time Calculation (min) 42 min    Equipment Utilized During Treatment Gait belt    Activity Tolerance Patient tolerated treatment well;No increased pain    Behavior During Therapy WFL for tasks assessed/performed           Past Medical History:  Diagnosis Date  . Cancer ([place])    Left breast with mast  . Hypertension   . IBS (irritable bowel syndrome)   . Migraines     Past Surgical History:  Procedure Laterality Date  . masectomy    . TONSILLECTOMY      There were no vitals filed for this visit.   Subjective Assessment - 08/15/20 1022    Subjective Patient reports increased fatigue today. She reports increased LLE knee pain and some sciatic pain over the weekend;    Pertinent History On May 5 patient was walking with her husband on a trail outside their home when she fell on the left side of her body and hit her head. Said she was going down a hill and could not stop and then fell. CT scan showed a small subarachnoid hemorrhage. She was followed expectantly with a follow-up CAT scan on June 5 that read showed resolution of the hemorrhage. She has pain in the lumbar spine and hip that travels down the leg. She reports that after she walks, her leg feels heavy like concrete with a burning sensation down the leg.    Limitations Walking    How long can you walk comfortably? 1/2 mile    Patient Stated Goals Walk without pain    Currently in Pain? Yes     Pain Score 5     Pain Location Knee    Pain Orientation Left    Pain Descriptors / Indicators Aching;Sore    Pain Type Chronic pain    Pain Onset More than a month ago    Pain Frequency Intermittent    Aggravating Factors  worse with standing/walking    Pain Relieving Factors rest/heat/stretches    Effect of Pain on Daily Activities decreased activity tolerance;    Multiple Pain Sites No              TREATMENT: Warm up on Nustep UE/LE level 2 x4 min concurrent with moist heat to low back (unbilled);    Patient Hooklying with moist heat to low back: -lumbar trunk rotation x10 reps -single knee to chest stretch 15 sec hold x2 reps bilaterally; -posterior pelvic rock 5 sec hold x10 reps;   Standing withgreentband around BLE: -hip abduction x15 reps each LE -hip extension x15 reps each LE -side stepping x5 feet x4 laps each direction; Patient required min-moderate verbal/tactile cues for correct exercise techniqueincluding cues for proper positioning for less back pain;  Seated: Piriformis stretch 20 sec hold x1 rep each  NMR:  Resisted walking 12.5# forward/backward, side step x2 way, x2  laps each with CGA for safety and min VCs for weight shift for balance control;   Patient tolerated session well. She reports less back/hip/LEpain.She was instructed in need to continue with lumbar flexion exercise daily to help improve posture and reduce lumbar impingement. She does require min VCs for proper exercise technique for optimal strengthening;Patient denies any increase in pain with advanced exercise.                         PT Education - 08/15/20 1025    Education Details LE strengthening, balance, HEP    Person(s) Educated Patient    Methods Explanation;Verbal cues    Comprehension Verbalized understanding;Returned demonstration;Verbal cues required;Need further instruction            PT Short Term Goals - 07/07/20 1118      PT SHORT  TERM GOAL #1   Title Pt will be independent with HEP in order to improve strength and decrease back pain in order to improve pain-free function at home.    Time 4    Period Weeks    Status Achieved    Target Date 06/21/20             PT Long Term Goals - 07/21/20 1120      PT LONG TERM GOAL #1   Title Pt will decrease worst back and hip pain as reported on NPRS by at least 2 points in order to demonstrate clinically significant reduction in back pain.    Baseline 08/02: 7/10, 9/16: 5/10    Time 4    Period Weeks    Status Partially Met    Target Date 08/18/20      PT LONG TERM GOAL #2   Title Pt will increase strength of by at least 1/2 MMT grade in order to demonstrate improvement in strength and function.    Baseline 8/02: see eval    Time 4    Period Weeks    Status Partially Met    Target Date 08/18/20      PT LONG TERM GOAL #3   Title Patient will increase FOTO score to equal to or greater than 64% to demonstrate statistically significant improvement in mobility and quality of life.    Baseline 08/02: 55, 9/16: 61%    Time 4    Status New    Target Date 08/18/20                 Plan - 08/15/20 1045    Clinical Impression Statement Patient reports increased soreness over the weekend. She admits she has been doing her standing exercise. Patient admits she hasn't been doing her hooklying lumbar flexion exercise. Patient requires re-education on proper positioning and need for continuing lumbar flexion exercise to help improve flexibility and reduce sciatic discomfort. Patient instructed in advanced LE strengthening with cues for proper positioning to avoid discomfort. PT increased repetition for increased strengthening. Patient would benefit from additional skilled PT intervention to improve strength and mobility; Plan to address goals next visit;    Personal Factors and Comorbidities Age;Comorbidity 3+    Comorbidities Hypertension, IBS, anxiety     Examination-Activity Limitations Locomotion Level    Examination-Participation Restrictions Community Activity    Stability/Clinical Decision Making Evolving/Moderate complexity    Rehab Potential Good    PT Frequency 2x / week    PT Duration 8 weeks    PT Treatment/Interventions ADLs/Self Care Home Management;Canalith Repostioning;Electrical Stimulation;Gait training;Stair training;Functional mobility training;Therapeutic  activities;Therapeutic exercise;Balance training;Neuromuscular re-education;Patient/family education;Manual techniques;Passive range of motion;Dry needling;Visual/perceptual remediation/compensation;Spinal Manipulations;Joint Manipulations;Vestibular;Aquatic Therapy    PT Next Visit Plan Review HEP    PT Home Exercise Plan Medbridge: [email or web address]    Consulted and Agree with Plan of Care Patient           Patient will benefit from skilled therapeutic intervention in order to improve the following deficits and impairments:  Abnormal gait, Decreased activity tolerance, Decreased strength, Difficulty walking, Hypomobility, Dizziness  Visit Diagnosis: Pain in left hip  Radiculopathy, lumbar region  Difficulty in walking, not elsewhere classified     Problem List Patient Active Problem List   Diagnosis Date Noted  . Acute hip pain, left   . Hypertension   . Fall   . SAH (subarachnoid hemorrhage) ([place])   . IBS (irritable bowel syndrome)     Cage Gupton PT, DPT 08/15/2020, 11:01 AM  [place] MAIN [facility] SERVICES [address] [place], [place], [address] Phone: [phone]   Fax:  [phone]  Name: Savannah Strong MRN: [id number] Date of Birth: 06/13/1941

## 2020-08-17 ENCOUNTER — Ambulatory Visit: Payer: Medicare Other | Admitting: Physical Therapy

## 2020-08-17 ENCOUNTER — Encounter: Payer: Self-pay | Admitting: Physical Therapy

## 2020-08-17 ENCOUNTER — Other Ambulatory Visit: Payer: Self-pay

## 2020-08-17 DIAGNOSIS — R262 Difficulty in walking, not elsewhere classified: Secondary | ICD-10-CM

## 2020-08-17 DIAGNOSIS — M5416 Radiculopathy, lumbar region: Secondary | ICD-10-CM

## 2020-08-17 DIAGNOSIS — M25552 Pain in left hip: Secondary | ICD-10-CM

## 2020-08-17 NOTE — Therapy (Signed)
[place] MAIN [facility] SERVICES [address] [place], [place], [address] Phone: [phone]   Fax:  [phone]  Physical Therapy Treatment  Patient Details  Name: Savannah Strong MRN: [id number] Date of Birth: 07/24/1941 Referring Provider (PT): Ellender Hose, MD   Encounter Date: 08/17/2020   PT End of Session - 08/17/20 1149    Visit Number 18    Number of Visits 31    Date for PT Re-Evaluation 09/28/20    Authorization Type Eval: 08/02, goals last assessed 08/17/20    PT Start Time 1146    PT Stop Time 1230    PT Time Calculation (min) 44 min    Equipment Utilized During Treatment Gait belt    Activity Tolerance Patient tolerated treatment well;No increased pain    Behavior During Therapy WFL for tasks assessed/performed           Past Medical History:  Diagnosis Date  . Cancer ([place])    Left breast with mast  . Hypertension   . IBS (irritable bowel syndrome)   . Migraines     Past Surgical History:  Procedure Laterality Date  . masectomy    . TONSILLECTOMY      There were no vitals filed for this visit.   Subjective Assessment - 08/17/20 1148    Subjective Patient reports slight discomfort in low back but denies any LE knee discomfort. She reports doing well today;    Pertinent History On May 5 patient was walking with her husband on a trail outside their home when she fell on the left side of her body and hit her head. Said she was going down a hill and could not stop and then fell. CT scan showed a small subarachnoid hemorrhage. She was followed expectantly with a follow-up CAT scan on June 5 that read showed resolution of the hemorrhage. She has pain in the lumbar spine and hip that travels down the leg. She reports that after she walks, her leg feels heavy like concrete with a burning sensation down the leg.    Limitations Walking    How long can you walk comfortably? 1/2 mile    Patient Stated Goals Walk without pain     Currently in Pain? Yes    Pain Score 1     Pain Location Back    Pain Orientation Lower    Pain Descriptors / Indicators Aching;Sore    Pain Type Chronic pain    Pain Onset More than a month ago    Pain Frequency Intermittent    Aggravating Factors  worse with standng/walking    Pain Relieving Factors rest/heat/stretches    Effect of Pain on Daily Activities decreased activity tolerance;    Multiple Pain Sites No             TREATMENT: Warm up on Crosstrainer level 2 x4 min (Unbilled);  Strength (out of 5) (performed in sitting) on 05/23/20 R/L 4+/4Hip flexion 4+/3* Hip ER 4+/3+Hip IR 5/4-* Knee extension 5/4 Knee flexion 5/4Ankle dorsiflexion 5/4+ Ankle plantarflexion   Strength (out of 5) (performed in sitting) on 08/17/20 R/L 5/4+ Hip flexion 5/5 Hip Abd/ER 5/5Hip Add/IR 5/5 Knee extension 5/5 Knee flexion 5/5Ankle dorsiflexion 4/4 Ankle plantarflexion    [facility] PT Assessment - 08/17/20 0001      Observation/Other Assessments   Focus on Therapeutic Outcomes (FOTO)  53      AROM   Lumbar Flexion 35    Lumbar Extension 30  Lumbar - Right Side Bend 25    Lumbar - Left Side Bend 30      6 minute walk test results    Aerobic Endurance Distance Walked 1190    Endurance additional comments without AD, with 2/10 back/hip pain reported, slight unsteadiness noted with turning especially after walking 5 min;            Patient tolerated session well. She reports minimal increase in pain with testing and gait ability;                     PT Education - 08/17/20 1149    Education Details progress towards goals, HEP    Person(s) Educated Patient    Methods Explanation;Verbal cues    Comprehension Verbalized understanding;Returned demonstration;Verbal cues required;Need further instruction            PT Short Term Goals - 08/17/20 1151      PT SHORT TERM GOAL #1   Title Pt will be independent with HEP in order to improve strength  and decrease back pain in order to improve pain-free function at home.    Time 4    Period Weeks    Status Achieved    Target Date 06/21/20             PT Long Term Goals - 08/17/20 1151      PT LONG TERM GOAL #1   Title Pt will decrease worst back and hip pain as reported on NPRS by at least 2 points in order to demonstrate clinically significant reduction in back pain.    Baseline 08/02: 7/10, 9/16: 5/10, 10/27: 3/10    Time 4    Period Weeks    Status Achieved    Target Date 08/18/20      PT LONG TERM GOAL #2   Title Pt will increase strength of by at least 1/2 MMT grade in order to demonstrate improvement in strength and function.    Baseline 8/02: see eval    Time 4    Period Weeks    Status Achieved    Target Date 08/18/20      PT LONG TERM GOAL #3   Title Patient will increase FOTO score to equal to or greater than 64% to demonstrate statistically significant improvement in mobility and quality of life.    Baseline 08/02: 55, 9/16: 61%, 10/27: 53%    Time 6    Status Not Met    Target Date 09/28/20      PT LONG TERM GOAL #4   Title Patient will improve 6 min walk to >1250 feet without increase in back pain for improved gait ability close to age group norms.    Time 6    Period Weeks    Status New    Target Date 09/28/20                 Plan - 08/17/20 1311    Clinical Impression Statement Patient reports minimal pain at start of session. She reports walking in the neighborhood approximately 20 min, up to 1/2 mile at a time with minimal difficulty. Patient was instructed in outcome measures. She does exhibit improvement in lumbar ROM, LE strength and mobility. HOwever patient did score lower on the FOTO survey. When reviewing her response she believes that her initial intake was a false representation of her mobility level. She reports feeling like she is doing better but is still having trouble walking long distances. Patient  would benefit from additional  skilled PT intervention to improve strength and mobility while reducing back pain;    Personal Factors and Comorbidities Age;Comorbidity 3+    Comorbidities Hypertension, IBS, anxiety    Examination-Activity Limitations Locomotion Level    Examination-Participation Restrictions Community Activity    Stability/Clinical Decision Making Evolving/Moderate complexity    Rehab Potential Good    PT Frequency 1x / week    PT Duration 6 weeks    PT Treatment/Interventions ADLs/Self Care Home Management;Canalith Repostioning;Electrical Stimulation;Gait training;Stair training;Functional mobility training;Therapeutic activities;Therapeutic exercise;Balance training;Neuromuscular re-education;Patient/family education;Manual techniques;Passive range of motion;Dry needling;Visual/perceptual remediation/compensation;Spinal Manipulations;Joint Manipulations;Vestibular;Aquatic Therapy    PT Next Visit Plan Review HEP    PT Home Exercise Plan Medbridge: [email or web address]    Consulted and Agree with Plan of Care Patient           Patient will benefit from skilled therapeutic intervention in order to improve the following deficits and impairments:  Abnormal gait, Decreased activity tolerance, Decreased strength, Difficulty walking, Hypomobility, Dizziness  Visit Diagnosis: Pain in left hip  Radiculopathy, lumbar region  Difficulty in walking, not elsewhere classified     Problem List Patient Active Problem List   Diagnosis Date Noted  . Acute hip pain, left   . Hypertension   . Fall   . SAH (subarachnoid hemorrhage) ([place])   . IBS (irritable bowel syndrome)     Lisle Skillman PT, DPT 08/17/2020, 1:43 PM  [place] MAIN [facility] SERVICES [address] [place], [place], [address] Phone: [phone]   Fax:  [phone]  Name: Savannah Strong MRN: [id number] Date of Birth: [birth year]

## 2020-08-25 ENCOUNTER — Ambulatory Visit: Payer: Medicare Other | Attending: Internal Medicine

## 2020-08-25 ENCOUNTER — Other Ambulatory Visit: Payer: Self-pay

## 2020-08-25 DIAGNOSIS — M25552 Pain in left hip: Secondary | ICD-10-CM | POA: Diagnosis present

## 2020-08-25 DIAGNOSIS — R262 Difficulty in walking, not elsewhere classified: Secondary | ICD-10-CM | POA: Insufficient documentation

## 2020-08-25 DIAGNOSIS — M5416 Radiculopathy, lumbar region: Secondary | ICD-10-CM | POA: Diagnosis present

## 2020-08-25 NOTE — Therapy (Signed)
[place] MAIN [facility] SERVICES [address] [place], [place], [address] Phone: [phone]   Fax:  [phone]  Physical Therapy Treatment  Patient Details  Name: Savannah Strong MRN: [id number] Date of Birth: 05-Oct-1941 Referring Provider (PT): Ellender Hose, MD   Encounter Date: 08/25/2020   PT End of Session - 08/25/20 1213    Visit Number 19    Number of Visits 31    Date for PT Re-Evaluation 09/28/20    Authorization Type Eval: 08/02, goals last assessed 08/17/20    PT Start Time 1025    PT Stop Time 1105    PT Time Calculation (min) 40 min    Equipment Utilized During Treatment Gait belt    Activity Tolerance Patient tolerated treatment well;No increased pain    Behavior During Therapy WFL for tasks assessed/performed           Past Medical History:  Diagnosis Date  . Cancer ([place])    Left breast with mast  . Hypertension   . IBS (irritable bowel syndrome)   . Migraines     Past Surgical History:  Procedure Laterality Date  . masectomy    . TONSILLECTOMY      There were no vitals filed for this visit.   Subjective Assessment - 08/25/20 1213    Subjective Pt good today. Reports knee and back are feeling generally good.    Pertinent History On May 5 patient was walking with her husband on a trail outside their home when she fell on the left side of her body and hit her head. Said she was going down a hill and could not stop and then fell. CT scan showed a small subarachnoid hemorrhage. She was followed expectantly with a follow-up CAT scan on June 5 that read showed resolution of the hemorrhage. She has pain in the lumbar spine and hip that travels down the leg. She reports that after she walks, her leg feels heavy like concrete with a burning sensation down the leg.    Currently in Pain? No/denies           INTERVENTION THIS DATE: -overground AMB, interval training for fitness and gait assessment- 1422f, no AD;  Narrow  based gait, Left toe-in, right toe-out WNL, Left trunk lateral flexion sustained. Left knee valgus suggestive of DJD, some slight intermittent weaving, able to perform exaggerated heel strike, but self selects to slight heel strike most of time.  -Myofascial ssutained release to Left paraspinals, Left multifidus, Left posterior glute medius, left lateral glute mediues, left piriformis. Palpable changes to Left vastus lateralis attachment not address in session, but remain sore.     PT Short Term Goals - 08/17/20 1151      PT SHORT TERM GOAL #1   Title Pt will be independent with HEP in order to improve strength and decrease back pain in order to improve pain-free function at home.    Time 4    Period Weeks    Status Achieved    Target Date 06/21/20             PT Long Term Goals - 08/17/20 1151      PT LONG TERM GOAL #1   Title Pt will decrease worst back and hip pain as reported on NPRS by at least 2 points in order to demonstrate clinically significant reduction in back pain.    Baseline 08/02: 7/10, 9/16: 5/10, 10/27: 3/10    Time 4    Period  Weeks    Status Achieved    Target Date 08/18/20      PT LONG TERM GOAL #2   Title Pt will increase strength of by at least 1/2 MMT grade in order to demonstrate improvement in strength and function.    Baseline 8/02: see eval    Time 4    Period Weeks    Status Achieved    Target Date 08/18/20      PT LONG TERM GOAL #3   Title Patient will increase FOTO score to equal to or greater than 64% to demonstrate statistically significant improvement in mobility and quality of life.    Baseline 08/02: 55, 9/16: 61%, 10/27: 53%    Time 6    Status Not Met    Target Date 09/28/20      PT LONG TERM GOAL #4   Title Patient will improve 6 min walk to >1250 feet without increase in back pain for improved gait ability close to age group norms.    Time 6    Period Weeks    Status New    Target Date 09/28/20                 Plan -  08/25/20 1214    Clinical Impression Statement Continued to progress gait training. Pt conitnues to progress in tolerance to AMB distance and time, Maintained quality of motor control from beginiing to end. Pt contiues to present with static Left lateral flexion of the trunk during standing and gait, suspect for antalgia of Left hip ABDCT structures injured in fall, but no prior gait analysis available. Pt does have adaptive muscle shortening and spasm of left lumbar region, addressed with manual release this date, but recent MMT and gait assessment indicates that Left hip ABDCT strength is not adequately rehabilitated to withstand correction of trunk in gait as this time. Discussed with patient.    Personal Factors and Comorbidities Age    Comorbidities Hypertension, IBS, anxiety    Examination-Activity Limitations Locomotion Level    Examination-Participation Restrictions Community Activity    Stability/Clinical Decision Making Evolving/Moderate complexity    Clinical Decision Making Moderate    Rehab Potential Good    PT Frequency 1x / week    PT Duration 6 weeks    PT Treatment/Interventions ADLs/Self Care Home Management;Canalith Repostioning;Electrical Stimulation;Gait training;Stair training;Functional mobility training;Therapeutic activities;Therapeutic exercise;Balance training;Neuromuscular re-education;Patient/family education;Manual techniques;Passive range of motion;Dry needling;Visual/perceptual remediation/compensation;Spinal Manipulations;Joint Manipulations;Vestibular;Aquatic Therapy    PT Next Visit Plan Review HEP    PT Home Exercise Plan Medbridge: [email or web address]    Consulted and Agree with Plan of Care Patient           Patient will benefit from skilled therapeutic intervention in order to improve the following deficits and impairments:  Abnormal gait, Decreased activity tolerance, Decreased strength, Difficulty walking, Hypomobility, Dizziness  Visit Diagnosis: Pain in left  hip  Radiculopathy, lumbar region  Difficulty in walking, not elsewhere classified     Problem List Patient Active Problem List   Diagnosis Date Noted  . Acute hip pain, left   . Hypertension   . Fall   . SAH (subarachnoid hemorrhage) ([place])   . IBS (irritable bowel syndrome)    .12:23 PM, 08/25/20 Etta Grandchild, PT, DPT Physical Therapist - [facility]  Outpatient Physical [place] [phone]     Etta Grandchild 08/25/2020, 12:19 PM  [place] MAIN [facility] SERVICES [place], [place],  [place] Phone: [phone]   Fax:  [phone]  Name: Savannah Strong MRN: [id number] Date of Birth: 05-12-1941

## 2020-08-31 ENCOUNTER — Ambulatory Visit: Payer: Medicare Other | Admitting: Physical Therapy

## 2020-09-07 ENCOUNTER — Encounter: Payer: Self-pay | Admitting: Physical Therapy

## 2020-09-07 ENCOUNTER — Other Ambulatory Visit: Payer: Self-pay

## 2020-09-07 ENCOUNTER — Ambulatory Visit: Payer: Medicare Other | Admitting: Physical Therapy

## 2020-09-07 DIAGNOSIS — M5416 Radiculopathy, lumbar region: Secondary | ICD-10-CM

## 2020-09-07 DIAGNOSIS — R262 Difficulty in walking, not elsewhere classified: Secondary | ICD-10-CM

## 2020-09-07 DIAGNOSIS — M25552 Pain in left hip: Secondary | ICD-10-CM

## 2020-09-07 NOTE — Patient Instructions (Signed)
Focus on walking and doing strengthening exercises (standing with band) daily - at least 5 days a week  You also want to do leg stretches daily to help reduce soreness- crossing leg over the knee and leaning forward (figure 4 stretch)  You also want to do some of the back exercises/core strengthening daily to help reduce risk for back pain returning (about 5-10 minutes) That includes sitting pelvic rocks, lying down and rocking knees side to side, pulling knee to chest etc.  On days when your back is hurting more, focus on the core strengthening exercises and not do as many standing exercises to help reduce back pain;  Your goal is to do 20-30 minutes of exercise daily

## 2020-09-07 NOTE — Therapy (Signed)
[place] MAIN [facility] SERVICES [address] [place], [place], [address] Phone: [phone]   Fax:  [phone]  Physical Therapy Treatment/DC summary Physical Therapy Progress Note   Dates of reporting period 07/07/20  to  09/07/20   Patient Details  Name: Savannah Strong MRN: [id number] Date of Birth: 1941/01/24 Referring Provider (PT): Ellender Hose, MD   Encounter Date: 09/07/2020   PT End of Session - 09/07/20 1156    Visit Number 20    Number of Visits 31    Date for PT Re-Evaluation 09/28/20    Authorization Type Eval: 08/02, goals last assessed 08/25/20    PT Start Time 1147    PT Stop Time 1230    PT Time Calculation (min) 43 min    Equipment Utilized During Treatment Gait belt    Activity Tolerance Patient tolerated treatment well;No increased pain    Behavior During Therapy WFL for tasks assessed/performed           Past Medical History:  Diagnosis Date  . Cancer ([place])    Left breast with mast  . Hypertension   . IBS (irritable bowel syndrome)   . Migraines     Past Surgical History:  Procedure Laterality Date  . masectomy    . TONSILLECTOMY      There were no vitals filed for this visit.   Subjective Assessment - 09/07/20 1155    Subjective Patient reports being able to walk about 8/10 and 9/10 of a mile a few days in the last few weeks; She reports her back pain has been better; When walking she has been feeling more unsteady;    Pertinent History On May 5 patient was walking with her husband on a trail outside their home when she fell on the left side of her body and hit her head. Said she was going down a hill and could not stop and then fell. CT scan showed a small subarachnoid hemorrhage. She was followed expectantly with a follow-up CAT scan on June 5 that read showed resolution of the hemorrhage. She has pain in the lumbar spine and hip that travels down the leg. She reports that after she walks, her leg feels  heavy like concrete with a burning sensation down the leg.    Currently in Pain? No/denies    Multiple Pain Sites No                  TREATMENT: Warm up onCrosstrainer UE/LE level 2 x4 min (unbilled);   Patient expressed increased unsteadiness with prolonged walking; This is likely a result of weakness in left hip with trendelenburg gait especially with increased unsteadiness;  Standing withgreentband around BLE: -hip abduction x15reps each LE -hip extension x15reps each LE -side stepping x21fet x4laps each direction; Patient required min-moderate verbal/tactile cues for correct exercise techniqueincluding cues for proper positioning for less back pain;   Patient tolerated session well. She reports less back/hip/LEpain.She was instructed in need to continue with lumbar flexion exercise daily to help improve posture and reduce lumbar impingement. She does require min VCs for proper exercise technique for optimal strengthening;Patient denies any increase in pain with advanced exercise.  Provided patient with written instruction for proper exercise to do daily for optimal strengthening and gait safety; Patient's condition has the potential to improve in response to therapy. Maximum improvement is yet to be obtained. The anticipated improvement is attainable and reasonable in a generally predictable time.  Patient requested to stop therapy  today as she has multiple commitments with upcoming holidays and will be out of town.  Goals were recently assessed on 11/4 at last session;                       PT Education - 09/07/20 1156    Education Details LE strengthening, HEP, postural re-education    Person(s) Educated Patient    Methods Explanation;Verbal cues    Comprehension Verbalized understanding;Returned demonstration;Verbal cues required;Need further instruction            PT Short Term Goals - 08/17/20 1151      PT SHORT TERM GOAL #1    Title Pt will be independent with HEP in order to improve strength and decrease back pain in order to improve pain-free function at home.    Time 4    Period Weeks    Status Achieved    Target Date 06/21/20             PT Long Term Goals - 08/17/20 1151      PT LONG TERM GOAL #1   Title Pt will decrease worst back and hip pain as reported on NPRS by at least 2 points in order to demonstrate clinically significant reduction in back pain.    Baseline 08/02: 7/10, 9/16: 5/10, 10/27: 3/10    Time 4    Period Weeks    Status Achieved    Target Date 08/18/20      PT LONG TERM GOAL #2   Title Pt will increase strength of by at least 1/2 MMT grade in order to demonstrate improvement in strength and function.    Baseline 8/02: see eval    Time 4    Period Weeks    Status Achieved    Target Date 08/18/20      PT LONG TERM GOAL #3   Title Patient will increase FOTO score to equal to or greater than 64% to demonstrate statistically significant improvement in mobility and quality of life.    Baseline 08/02: 55, 9/16: 61%, 10/27: 53%    Time 6    Status Not Met    Target Date 09/28/20      PT LONG TERM GOAL #4   Title Patient will improve 6 min walk to >1250 feet without increase in back pain for improved gait ability close to age group norms.    Time 6    Period Weeks    Status New    Target Date 09/28/20                 Plan - 09/07/20 1230    Clinical Impression Statement Patient presents to therapy with questions regarding proper positioning and walking techniques. She reports increased unsteadiness with long distance walking likely related to hip weakness and trendelenburg gait. PT recommended patient increase hip strengthening exercise HEP adherence. She reports she hasn't been doing them lately. Patient educated in optimal exercise to do daily for postural strengthening, hip strengthening and gait safety. Patient verbalized understanding. She requested to be discharged  from therapy today due to upcoming holidays and family committments. She will call back to restart therapy in the new year when she can focus on therapy.    Personal Factors and Comorbidities Age    Comorbidities Hypertension, IBS, anxiety    Examination-Activity Limitations Locomotion Level    Examination-Participation Restrictions Community Activity    Stability/Clinical Decision Making Evolving/Moderate complexity    Rehab Potential Good  PT Frequency 1x / week    PT Duration 6 weeks    PT Treatment/Interventions ADLs/Self Care Home Management;Canalith Repostioning;Electrical Stimulation;Gait training;Stair training;Functional mobility training;Therapeutic activities;Therapeutic exercise;Balance training;Neuromuscular re-education;Patient/family education;Manual techniques;Passive range of motion;Dry needling;Visual/perceptual remediation/compensation;Spinal Manipulations;Joint Manipulations;Vestibular;Aquatic Therapy    PT Next Visit Plan Review HEP    PT Home Exercise Plan Medbridge: [id number]    Consulted and Agree with Plan of Care Patient           Patient will benefit from skilled therapeutic intervention in order to improve the following deficits and impairments:  Abnormal gait, Decreased activity tolerance, Decreased strength, Difficulty walking, Hypomobility, Dizziness  Visit Diagnosis: Pain in left hip  Radiculopathy, lumbar region  Difficulty in walking, not elsewhere classified     Problem List Patient Active Problem List   Diagnosis Date Noted  . Acute hip pain, left   . Hypertension   . Fall   . SAH (subarachnoid hemorrhage) ([place])   . IBS (irritable bowel syndrome)     Phong Isenberg PT, DPT 09/07/2020, 12:32 PM  [place] MAIN [facility] SERVICES [address] [place], [place], [address] Phone: [phone]   Fax:  [phone]  Name: Savannah Strong MRN: [id number] Date of Birth: Mar 29, 1941

## 2020-09-22 ENCOUNTER — Ambulatory Visit: Payer: Medicare Other | Attending: Internal Medicine | Admitting: Physical Therapy

## 2020-09-22 ENCOUNTER — Other Ambulatory Visit: Payer: Self-pay

## 2020-09-22 DIAGNOSIS — R262 Difficulty in walking, not elsewhere classified: Secondary | ICD-10-CM | POA: Insufficient documentation

## 2020-09-22 DIAGNOSIS — M5416 Radiculopathy, lumbar region: Secondary | ICD-10-CM | POA: Insufficient documentation

## 2020-09-22 DIAGNOSIS — M25552 Pain in left hip: Secondary | ICD-10-CM | POA: Insufficient documentation

## 2020-09-22 NOTE — Therapy (Signed)
[place] MAIN [facility] SERVICES [address] [place], [place], [address] Phone: [phone]   Fax:  [phone]  Patient Details  Name: Savannah Strong MRN: [id number] Date of Birth: [birth year] Referring Provider:  Ellender Hose, MD  Encounter Date: 09/22/2020  Patient arrived for therapy appointment. However she was discharged on 09/07/20. This appointment was erroneously left in the system and patient forgot she was discharged. She was okay with this appointment being cancelled and wishes to hold off on therapy until after the holidays.    Minoru Chap PT, DPT 09/22/2020, 12:50 PM  [place] MAIN [facility] SERVICES [address] [place], [place], [address] Phone: [phone]   Fax:  [phone]

## 2020-11-22 ENCOUNTER — Encounter: Payer: Self-pay | Admitting: Occupational Therapy

## 2020-11-22 ENCOUNTER — Ambulatory Visit: Payer: Medicare Other | Attending: Internal Medicine | Admitting: Occupational Therapy

## 2020-11-22 DIAGNOSIS — I69015 Cognitive social or emotional deficit following nontraumatic subarachnoid hemorrhage: Secondary | ICD-10-CM | POA: Insufficient documentation

## 2020-11-22 DIAGNOSIS — R278 Other lack of coordination: Secondary | ICD-10-CM | POA: Diagnosis present

## 2020-11-22 DIAGNOSIS — M6281 Muscle weakness (generalized): Secondary | ICD-10-CM | POA: Diagnosis present

## 2020-11-24 ENCOUNTER — Ambulatory Visit: Payer: Medicare Other | Admitting: Occupational Therapy

## 2020-11-24 NOTE — Therapy (Signed)
[place] MAIN [facility] SERVICES [address] [place], [place], [address] Phone: [phone]   Fax:  [phone]  Occupational Therapy Evaluation  Patient Details  Name: Savannah Strong MRN: [id number] Date of Birth: Apr 09, 1941 No data recorded  Encounter Date: 11/22/2020   OT End of Session - 11/24/20 0844    Visit Number 1    Number of Visits 24    Date for OT Re-Evaluation 02/14/21    OT Start Time 1303    OT Stop Time 1400    OT Time Calculation (min) 57 min    Activity Tolerance Patient tolerated treatment well    Behavior During Therapy [facility] for tasks assessed/performed           Past Medical History:  Diagnosis Date  . Cancer ([place])    Left breast with mast  . Hypertension   . IBS (irritable bowel syndrome)   . Migraines     Past Surgical History:  Procedure Laterality Date  . masectomy    . TONSILLECTOMY      There were no vitals filed for this visit.      [facility] OT Assessment - 11/24/20 0851      Assessment   Medical Diagnosis history of subarachnoid hemorrage    Onset Date/Surgical Date 02/24/20    Hand Dominance Right    Prior Therapy yes, PT      Precautions   Precautions Fall      Restrictions   Weight Bearing Restrictions No      Balance Screen   Has the patient fallen in the past 6 months Yes    How many times? 2    Has the patient had a decrease in activity level because of a fear of falling?  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  Patient reports she assists with laundry tasks and light homemaking skills at times.  She has someone to come and do heavier household chores 2 times a month.  She has memory issues with her medication management and sometimes forgets when to take her medication.  She is to [employer]  and perform meal preparation however now she does not cook as much and her husband has taken over this task the majority of the time.  Husband reports when patient needs to write a check it takes her an extended.  Of time as well as balancing in managing her account for a bill.  She has difficulty at times with determining the day of the week.  Patient and wife also report patient was more social prior to Covid and has very little social interactions presently.  Patient is slower with responses and reports that she is unsure of answers at times.      IADL   Prior Level of Function Shopping independent    Shopping Takes care of all shopping needs independently    Prior Level of Function Light Housekeeping independent    Light Housekeeping Does personal laundry completely;Performs light daily tasks such as dishwashing, bed making    Prior Level of Function Meal Prep independent    Meal Prep Able to  complete simple warm meal prep    Prior Level of Function Passenger transport manager own vehicle    Prior Level of Function Medication Managment independent    Medication Management Has difficulty remembering to take medication    Prior Level of Function Therapist, sports day-to-day purchases, but needs help with banking, major purchases, etc.      Mobility   Mobility Status History of falls      Written Expression   Dominant Hand Right      Vision - History   Baseline Vision Wears glasses only for reading      Cognition   Overall Cognitive Status Impaired/Different from baseline    Area of Impairment Attention;Memory;Orientation    Memory Decreased short-term memory    Memory Impaired    Cognition Comments Difficulty at times with reading, following story lines with TV shows.  Difficulty with knowing the day of the week.      Observation/Other Assessments   Focus on Therapeutic Outcomes (FOTO)  82      Sensation   Light Touch Appears Intact    Hot/Cold Appears Intact      Coordination   9 Hole Peg Test Right;Left    Right 9 Hole Peg Test 31    Left 9 Hole Peg Test 30      AROM   Overall AROM  Within functional limits for tasks performed      Strength   Overall Strength Within functional limits for tasks performed      Hand Function   Right Hand Grip (lbs) 45    Right Hand Lateral Pinch 15 lbs    Right Hand 3 Point Pinch 16 lbs    Left Hand Grip (lbs) 47    Left Hand Lateral Pinch 15 lbs    Left 3 point pinch 17 lbs          Will continue to assess cognitive function and update Plan of care as needed.                  OT Education - 11/24/20 0844    Education Details Role of OT, goals, plan of care    Person(s) Educated Patient    Methods Explanation    Comprehension Verbalized understanding               OT Long Term Goals - 11/24/20 0859      OT  LONG  TERM GOAL #1   Title Patient will demonstrate the ability to manage a calendar with appointments with the awareness of the current day of the week with supervision from her husband.    Baseline Eval: Patient has difficulty identifying the day of the week at times and managing appointments.    Time 12    Period Weeks    Status New    Target Date 02/14/21      OT LONG TERM GOAL #2   Title Patient will demonstrate the ability to perform light meal preparation with modified independence    Baseline Eval: husband performing majority of meal prep    Time 6    Period Weeks    Status New    Target Date 01/03/21      OT LONG TERM GOAL #3   Title Patient will complete meal preparation utilizing oven with good safety awareness with supervision    Baseline Eval: Husband performing meal prep    Time 12    Period Weeks    Status New    Target Date 02/14/21      OT LONG TERM GOAL #4   Title Patient will demonstrate compensatory strategies for medication management with supervision.    Baseline Eval: Patient has memory issues and sometimes forgets to take medication    Time 12    Period Weeks    Status New    Target Date 02/14/21      OT LONG TERM GOAL #5   Title Patient will demonstrate writing 1 check and necessary math and her check register in less than 8 minutes    Baseline Eval: Patient requires 20 minutes to complete    Time 12    Period Weeks    Status New    Target Date 02/14/21      Long Term Additional Goals   Additional Long Term Goals Yes      OT LONG TERM GOAL #6   Title Patient will demonstrate home exercise program with modified independence    Baseline No current program    Time 6    Period Weeks    Status New    Target Date 01/03/21      OT LONG TERM GOAL #7   Title Patient will improve bilateral upper body strength by 1 muscle grade to complete necessary daily tasks with good speed and efficiency.    Baseline Decreased strength overall in bilateral upper  extremity    Time 12    Period Weeks    Status New    Target Date 02/14/21                 Plan - 11/24/20 0845    Clinical Impression Statement Patient is an 80 year old female referred to outpatient Occupational Therapy for evaluation and treatment for cognitive sequencing for ADLs post subarachnoid hemorrhage.  Patient presents with decreased memory,  decreased attention, decreased processing time during tasks, occasional sequencing problems,  limitations with executive functioning, muscle weakness, decreased coordination skills and decreased IADL tasks.   Patient would benefit from skilled occupational therapy to maximize safety and independence with necessary daily tasks at home and in the community.    OT Occupational Profile and History Detailed Assessment- Review of Records and additional review of physical, cognitive, psychosocial history related to current functional performance    Occupational performance deficits (Please refer to evaluation for details): ADL's;IADL's;Rest and Sleep;Leisure;Social Participation    Body Structure / Function / Physical Skills ADL;Dexterity;Strength;Balance;Coordination;FMC;IADL;Endurance;UE functional  use;Mobility    Cognitive Skills Attention;Memory;Sequencing    Psychosocial Skills Environmental  Adaptations;Habits;Routines and Behaviors    Rehab Potential Good    Clinical Decision Making Several treatment options, min-mod task modification necessary    Comorbidities Affecting Occupational Performance: May have comorbidities impacting occupational performance    Modification or Assistance to Complete Evaluation  No modification of tasks or assist necessary to complete eval    OT Frequency 2x / week    OT Duration 12 weeks    OT Treatment/Interventions Self-care/ADL training;Therapeutic exercise;DME and/or AE instruction;Functional Mobility Training;Cognitive remediation/compensation;Balance training;Neuromuscular education;Manual Therapy;Moist  Heat;Therapeutic activities;Patient/family education;Coping strategies training    Consulted and Agree with Plan of Care Patient;Family member/caregiver    Family Member Consulted husband           Patient will benefit from skilled therapeutic intervention in order to improve the following deficits and impairments:   Body Structure / Function / Physical Skills: ADL,Dexterity,Strength,Balance,Coordination,FMC,IADL,Endurance,UE functional use,Mobility Cognitive Skills: Attention,Memory,Sequencing Psychosocial Skills: Environmental  Adaptations,Habits,Routines and Behaviors   Visit Diagnosis: Muscle weakness (generalized) - Plan: Ot plan of care cert/re-cert  Other lack of coordination - Plan: Ot plan of care cert/re-cert  Cognitive social or emotional deficit following nontraumatic subarachnoid hemorrhage - Plan: Ot plan of care cert/re-cert    Problem List Patient Active Problem List   Diagnosis Date Noted  . Acute hip pain, left   . Hypertension   . Fall   . SAH (subarachnoid hemorrhage) ([facility])   . IBS (irritable bowel syndrome)    Kamaree Wheatley Cornelius Moras, OTR/L, CLT  Eliabeth Shoff 11/24/2020, 10:26 AM  [place] [facility] MAIN [facility] SERVICES [address] [place], [place], [address] Phone: [phone]   Fax:  [phone]  Name: Savannah Strong MRN: [id number] Date of Birth: 10-05-1941

## 2020-11-29 ENCOUNTER — Ambulatory Visit: Payer: Medicare Other | Admitting: Occupational Therapy

## 2020-12-01 ENCOUNTER — Other Ambulatory Visit: Payer: Self-pay

## 2020-12-01 ENCOUNTER — Encounter: Payer: Self-pay | Admitting: Occupational Therapy

## 2020-12-01 ENCOUNTER — Ambulatory Visit: Payer: Medicare Other | Admitting: Occupational Therapy

## 2020-12-01 DIAGNOSIS — R278 Other lack of coordination: Secondary | ICD-10-CM

## 2020-12-01 DIAGNOSIS — M6281 Muscle weakness (generalized): Secondary | ICD-10-CM

## 2020-12-01 NOTE — Therapy (Signed)
[place] MAIN [facility] SERVICES [address] [place], [place], [address] Phone: [phone]   Fax:  [phone]  Occupational Therapy Treatment  Patient Details  Name: Savannah Strong MRN: [id number] Date of Birth: 1941/09/22 No data recorded  Encounter Date: 12/01/2020   OT End of Session - 12/01/20 1541    Visit Number 2    Number of Visits 24    Date for OT Re-Evaluation 02/14/21    OT Start Time 1102    OT Stop Time 1150    OT Time Calculation (min) 48 min    Activity Tolerance Patient tolerated treatment well    Behavior During Therapy [facility] for tasks assessed/performed           Past Medical History:  Diagnosis Date  . Cancer ([place])    Left breast with mast  . Hypertension   . IBS (irritable bowel syndrome)   . Migraines     Past Surgical History:  Procedure Laterality Date  . masectomy    . TONSILLECTOMY      There were no vitals filed for this visit.   Subjective Assessment - 12/01/20 1538    Subjective  Pt reports she has trouble with managing her calendar, brought in her pocket calender to work on.    Pertinent History Patient with a history of a small subarachnoid hemorrhage on Feb 24, 2020 when she fell hiking with her husband.  She also has a history of lumbar radiculopathy and has been seen by physical therapy recently.  She reports an additional fall around Thanksgiving when she went to her nephew's office and fell on a step when her hands were full trying to take plates to the kitchen.  Patient reports memory issues which affect her daily tasks.    Patient Stated Goals Patient would like to improve her level of independence, "be like I was before."    Currently in Pain? No/denies    Pain Score 0-No pain          Pt. Seen for task of managing current calendar of appointments and events, patient has difficulty determining what day of the week it is. Instructed on ways to organize her calendar, using a paperclip to the  current month for easy access, crossing out days once they are completed to know what is the next day will be.  Pt instructed on use of sticky notes for placing date including day of the week along with any appointments/to do lists for the day.  Once the appt is completed, pt will cross off the task.  Completed a sticky note for todays activities.  Pt to try these strategies and report back next time.  Pt discussing her anxiety over forgetting things and also her limited social interactions.  She would like to work on planning a book study group with her friends at her home soon.   Response to tx:  Patient able to demonstrate understanding of strategies to help manage her calendar, will try to work on implementing over the next few days.  Will reassess next session and modify if needed.  Patient may benefit from a changeable daily calendar block at home to look at for the day of the week.  Will show her some options next session.  Will also work towards her personal goal of hosting book study at her house and all related tasks.  Continue to work towards goals in plan of care to increase independence in daily tasks.  OT Education - 12/01/20 1541    Education Details organizing information    Person(s) Educated Patient    Methods Explanation;Demonstration;Verbal cues    Comprehension Verbalized understanding;Returned demonstration;Verbal cues required               OT Long Term Goals - 11/24/20 0859      OT LONG TERM GOAL #1   Title Patient will demonstrate the ability to manage a calendar with appointments with the awareness of the current day of the week with supervision from her husband.    Baseline Eval: Patient has difficulty identifying the day of the week at times and managing appointments.    Time 12    Period Weeks    Status New    Target Date 02/14/21      OT LONG TERM GOAL #2   Title Patient will demonstrate the ability to perform light  meal preparation with modified independence    Baseline Eval: husband performing majority of meal prep    Time 6    Period Weeks    Status New    Target Date 01/03/21      OT LONG TERM GOAL #3   Title Patient will complete meal preparation utilizing oven with good safety awareness with supervision    Baseline Eval: Husband performing meal prep    Time 12    Period Weeks    Status New    Target Date 02/14/21      OT LONG TERM GOAL #4   Title Patient will demonstrate compensatory strategies for medication management with supervision.    Baseline Eval: Patient has memory issues and sometimes forgets to take medication    Time 12    Period Weeks    Status New    Target Date 02/14/21      OT LONG TERM GOAL #5   Title Patient will demonstrate writing 1 check and necessary math and her check register in less than 8 minutes    Baseline Eval: Patient requires 20 minutes to complete    Time 12    Period Weeks    Status New    Target Date 02/14/21      Long Term Additional Goals   Additional Long Term Goals Yes      OT LONG TERM GOAL #6   Title Patient will demonstrate home exercise program with modified independence    Baseline No current program    Time 6    Period Weeks    Status New    Target Date 01/03/21      OT LONG TERM GOAL #7   Title Patient will improve bilateral upper body strength by 1 muscle grade to complete necessary daily tasks with good speed and efficiency.    Baseline Decreased strength overall in bilateral upper extremity    Time 12    Period Weeks    Status New    Target Date 02/14/21                 Plan - 12/01/20 1542    Clinical Impression Statement Patient able to demonstrate understanding of strategies to help manage her calendar, will try to work on implementing over the next few days.  Will reassess next session and modify if needed.  Patient may benefit from a changeable daily calendar block at home to look at for the day of the week.   Will show her some options next session.  Will also work towards her personal goal of hosting  book study at her house and all related tasks.  Continue to work towards goals in plan of care to increase independence in daily tasks.    OT Occupational Profile and History Detailed Assessment- Review of Records and additional review of physical, cognitive, psychosocial history related to current functional performance    Occupational performance deficits (Please refer to evaluation for details): ADL's;IADL's;Rest and Sleep;Leisure;Social Participation    Body Structure / Function / Physical Skills ADL;Dexterity;Strength;Balance;Coordination;FMC;IADL;Endurance;UE functional use;Mobility    Cognitive Skills Attention;Memory;Sequencing    Psychosocial Skills Environmental  Adaptations;Habits;Routines and Behaviors    Rehab Potential Good    Clinical Decision Making Several treatment options, min-mod task modification necessary    Comorbidities Affecting Occupational Performance: May have comorbidities impacting occupational performance    Modification or Assistance to Complete Evaluation  No modification of tasks or assist necessary to complete eval    OT Frequency 2x / week    OT Duration 12 weeks    OT Treatment/Interventions Self-care/ADL training;Therapeutic exercise;DME and/or AE instruction;Functional Mobility Training;Cognitive remediation/compensation;Balance training;Neuromuscular education;Manual Therapy;Moist Heat;Therapeutic activities;Patient/family education;Coping strategies training    Consulted and Agree with Plan of Care Patient;Family member/caregiver    Family Member Consulted husband           Patient will benefit from skilled therapeutic intervention in order to improve the following deficits and impairments:   Body Structure / Function / Physical Skills: ADL,Dexterity,Strength,Balance,Coordination,FMC,IADL,Endurance,UE functional use,Mobility Cognitive Skills:  Attention,Memory,Sequencing Psychosocial Skills: Environmental  Adaptations,Habits,Routines and Behaviors   Visit Diagnosis: Muscle weakness (generalized)  Other lack of coordination    Problem List Patient Active Problem List   Diagnosis Date Noted  . Acute hip pain, left   . Hypertension   . Fall   . SAH (subarachnoid hemorrhage) ([place])   . IBS (irritable bowel syndrome)    Kasey Ewings Oneita Jolly, OTR/L, CLT  Roniya Tetro 12/01/2020, 4:34 PM  [place] MAIN [facility] SERVICES [address] [place], [place], [address] Phone: [phone]   Fax:  [phone]  Name: ESTEFANA TAYLOR MRN: [id number] Date of Birth: 1940-12-01

## 2020-12-06 ENCOUNTER — Ambulatory Visit: Payer: Medicare Other | Admitting: Occupational Therapy

## 2020-12-06 ENCOUNTER — Other Ambulatory Visit: Payer: Self-pay

## 2020-12-06 DIAGNOSIS — M6281 Muscle weakness (generalized): Secondary | ICD-10-CM

## 2020-12-06 DIAGNOSIS — I69015 Cognitive social or emotional deficit following nontraumatic subarachnoid hemorrhage: Secondary | ICD-10-CM

## 2020-12-06 DIAGNOSIS — R278 Other lack of coordination: Secondary | ICD-10-CM

## 2020-12-07 ENCOUNTER — Encounter: Payer: Self-pay | Admitting: Occupational Therapy

## 2020-12-07 NOTE — Therapy (Signed)
[place] MAIN [facility] SERVICES [address] [place], [place], [address] Phone: [phone]   Fax:  [phone]  Occupational Therapy Treatment  Patient Details  Name: Savannah Strong MRN: [id number] Date of Birth: [birth year] No data recorded  Encounter Date: 12/06/2020   OT End of Session - 12/07/20 1421    Visit Number 3    Number of Visits 24    Date for OT Re-Evaluation 02/14/21    OT Start Time 1306    OT Stop Time 1400    OT Time Calculation (min) 54 min    Activity Tolerance Patient tolerated treatment well    Behavior During Therapy [facility] for tasks assessed/performed           Past Medical History:  Diagnosis Date  . Cancer ([place])    Left breast with mast  . Hypertension   . IBS (irritable bowel syndrome)   . Migraines     Past Surgical History:  Procedure Laterality Date  . masectomy    . TONSILLECTOMY      There were no vitals filed for this visit.   Subjective Assessment - 12/07/20 1420    Subjective  Pt reports she finally found her therapy schedule and brought it in today to review with therapist to make some changes and to merge with her other calendar.    Pertinent History Patient with a history of a small subarachnoid hemorrhage on Feb 24, 2020 when she fell hiking with her husband.  She also has a history of lumbar radiculopathy and has been seen by physical therapy recently.  She reports an additional fall around Thanksgiving when she went to her nephew's office and fell on a step when her hands were full trying to take plates to the kitchen.  Patient reports memory issues which affect her daily tasks.    Patient Stated Goals Patient would like to improve her level of independence, "be like I was before."    Currently in Pain? No/denies    Pain Score 0-No pain          ADL:  Patient reports she was running a few minutes late today, she drove herself.  Reports her husband is playing golf with friends. She is  planning to make her husband cookies for Valentines.  She reports she could not find her therapy schedule however her husband had it and gave it to her last night.  She would like to look at the schedule and make sure all the dates will work and to also merge her to schedules together in her calendar today.  Patient still has her sticky note from last session in her calendar.  Discussed the need to change the sticky note each day with the date and day of the week along with her to do list for the current day.  Assisted patient with formulating her to do list for today to place in her calendar.  She was able to correctly identify the month, day of the week and start a list of things she will need to accomplish today.  Patient indicating she needs to go to the grocery store but had difficulty recalling what items she needed to pick up.  With verbal cues she was able to recognize the need to pick up a few items to make cookies for her husband. Patient reviewed therapy calendar and indicated a couple of days where she has conflicting appointments, therapist help to reschedule/canceled these appointments.  With therapist guidance patient  added remaining therapy appointments to her calendar with the appropriate title and time of day.  Check writing: Patient seen for check writing skills this date to complete 2 checks, 1 for a local purchase and the other to her husband for this months purchases on credit card.  She was able to complete all the elements of the check, however got confused regarding the date and year.  After discussion with therapist she was able to demonstrate understanding.  Patient reports she went shopping the other day and opened up a new credit card at the store, she reports she should not have done this since she has a hard time remembering and keeping up with paying her bills on time.  She normally uses her husband's credit card and he pays the bill first and then she repays him by writing a  check.  We discussed options for this new credit card account, patient deciding to make the payment early and then not use the credit card in the future.  Response to treatment: Patient continues to demonstrate difficulty at times recognizing the day of the week without taking extra time to look at her calendar or think through.  She did not cross through the days on her calendar that have passed as directed last session, we reviewed this information again.  Patient does find a to do list useful and needs some minimal guiding to complete.  Minimal guarding for managing her calendar this date with adding additional appointments.  Patient demonstrating some confusion over the current year when writing a check.  We will continue to work on strategies to assist with memory which affect her daily tasks.                     OT Education - 12/07/20 1421    Education Details organizing information, managing calendar, check writing    Person(s) Educated Patient    Methods Explanation;Demonstration;Verbal cues    Comprehension Verbalized understanding;Returned demonstration;Verbal cues required               OT Long Term Goals - 11/24/20 0859      OT LONG TERM GOAL #1   Title Patient will demonstrate the ability to manage a calendar with appointments with the awareness of the current day of the week with supervision from her husband.    Baseline Eval: Patient has difficulty identifying the day of the week at times and managing appointments.    Time 12    Period Weeks    Status New    Target Date 02/14/21      OT LONG TERM GOAL #2   Title Patient will demonstrate the ability to perform light meal preparation with modified independence    Baseline Eval: husband performing majority of meal prep    Time 6    Period Weeks    Status New    Target Date 01/03/21      OT LONG TERM GOAL #3   Title Patient will complete meal preparation utilizing oven with good safety awareness with  supervision    Baseline Eval: Husband performing meal prep    Time 12    Period Weeks    Status New    Target Date 02/14/21      OT LONG TERM GOAL #4   Title Patient will demonstrate compensatory strategies for medication management with supervision.    Baseline Eval: Patient has memory issues and sometimes forgets to take medication    Time 12  Period Weeks    Status New    Target Date 02/14/21      OT LONG TERM GOAL #5   Title Patient will demonstrate writing 1 check and necessary math and her check register in less than 8 minutes    Baseline Eval: Patient requires 20 minutes to complete    Time 12    Period Weeks    Status New    Target Date 02/14/21      Long Term Additional Goals   Additional Long Term Goals Yes      OT LONG TERM GOAL #6   Title Patient will demonstrate home exercise program with modified independence    Baseline No current program    Time 6    Period Weeks    Status New    Target Date 01/03/21      OT LONG TERM GOAL #7   Title Patient will improve bilateral upper body strength by 1 muscle grade to complete necessary daily tasks with good speed and efficiency.    Baseline Decreased strength overall in bilateral upper extremity    Time 12    Period Weeks    Status New    Target Date 02/14/21                 Plan - 12/07/20 1422    Clinical Impression Statement Patient continues to demonstrate difficulty at times recognizing the day of the week without taking extra time to look at her calendar or think through.  She did not cross through the days on her calendar that have passed as directed last session, we reviewed this information again.  Patient does find a to do list useful and needs some minimal guiding to complete.  Minimal guarding for managing her calendar this date with adding additional appointments.  Patient demonstrating some confusion over the current year when writing a check.  We will continue to work on strategies to assist  with memory which affect her daily tasks.    OT Occupational Profile and History Detailed Assessment- Review of Records and additional review of physical, cognitive, psychosocial history related to current functional performance    Occupational performance deficits (Please refer to evaluation for details): ADL's;IADL's;Rest and Sleep;Leisure;Social Participation    Body Structure / Function / Physical Skills ADL;Dexterity;Strength;Balance;Coordination;FMC;IADL;Endurance;UE functional use;Mobility    Cognitive Skills Attention;Memory;Sequencing    Psychosocial Skills Environmental  Adaptations;Habits;Routines and Behaviors    Rehab Potential Good    Clinical Decision Making Several treatment options, min-mod task modification necessary    Comorbidities Affecting Occupational Performance: May have comorbidities impacting occupational performance    Modification or Assistance to Complete Evaluation  No modification of tasks or assist necessary to complete eval    OT Frequency 2x / week    OT Duration 12 weeks    OT Treatment/Interventions Self-care/ADL training;Therapeutic exercise;DME and/or AE instruction;Functional Mobility Training;Cognitive remediation/compensation;Balance training;Neuromuscular education;Manual Therapy;Moist Heat;Therapeutic activities;Patient/family education;Coping strategies training    Consulted and Agree with Plan of Care Patient;Family member/caregiver    Family Member Consulted husband           Patient will benefit from skilled therapeutic intervention in order to improve the following deficits and impairments:   Body Structure / Function / Physical Skills: ADL,Dexterity,Strength,Balance,Coordination,FMC,IADL,Endurance,UE functional use,Mobility Cognitive Skills: Attention,Memory,Sequencing Psychosocial Skills: Environmental  Adaptations,Habits,Routines and Behaviors   Visit Diagnosis: Muscle weakness (generalized)  Other lack of coordination  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 Acute hip pain, left   . Hypertension   . Fall   . SAH (subarachnoid hemorrhage) ([place])   . IBS (irritable bowel syndrome)    Amy Oneita Jolly, OTR/L, CLT  Lovett,Amy 12/07/2020, 2:45 PM  [place] MAIN [facility] SERVICES [address] [place], [place], [address] Phone: [phone]   Fax:  [phone]  Name: Savannah Strong MRN: [id number] Date of Birth: 1941-06-24

## 2020-12-08 ENCOUNTER — Ambulatory Visit: Payer: Medicare Other | Admitting: Occupational Therapy

## 2020-12-13 ENCOUNTER — Ambulatory Visit: Payer: Medicare Other | Admitting: Occupational Therapy

## 2020-12-15 ENCOUNTER — Ambulatory Visit: Payer: Medicare Other | Admitting: Occupational Therapy

## 2020-12-20 ENCOUNTER — Ambulatory Visit: Payer: Medicare Other | Admitting: Occupational Therapy

## 2020-12-22 ENCOUNTER — Ambulatory Visit: Payer: Medicare Other | Admitting: Occupational Therapy

## 2020-12-27 ENCOUNTER — Ambulatory Visit: Payer: Medicare Other | Admitting: Occupational Therapy

## 2020-12-29 ENCOUNTER — Ambulatory Visit: Payer: Medicare Other | Admitting: Occupational Therapy

## 2021-01-03 ENCOUNTER — Other Ambulatory Visit: Payer: Self-pay

## 2021-01-03 ENCOUNTER — Encounter: Payer: Self-pay | Admitting: Occupational Therapy

## 2021-01-03 ENCOUNTER — Ambulatory Visit: Payer: Medicare Other | Attending: Internal Medicine | Admitting: Occupational Therapy

## 2021-01-03 DIAGNOSIS — R278 Other lack of coordination: Secondary | ICD-10-CM | POA: Insufficient documentation

## 2021-01-03 DIAGNOSIS — M6281 Muscle weakness (generalized): Secondary | ICD-10-CM | POA: Insufficient documentation

## 2021-01-05 ENCOUNTER — Ambulatory Visit: Payer: Medicare Other | Admitting: Occupational Therapy

## 2021-01-05 NOTE — Therapy (Signed)
[place] MAIN [facility] SERVICES [address] [place], [place], [address] Phone: [phone]   Fax:  [phone]  Occupational Therapy Treatment  Patient Details  Name: Savannah Strong MRN: [id number] Date of Birth: Mar 07, 1941 No data recorded  Encounter Date: 01/03/2021   OT End of Session - 01/05/21 1452    Visit Number 4    Number of Visits 24    Date for OT Re-Evaluation 02/14/21    OT Start Time 1106    OT Stop Time 1150    OT Time Calculation (min) 44 min    Activity Tolerance Patient tolerated treatment well    Behavior During Therapy [facility] for tasks assessed/performed           Past Medical History:  Diagnosis Date  . Cancer ([place])    Left breast with mast  . Hypertension   . IBS (irritable bowel syndrome)   . Migraines     Past Surgical History:  Procedure Laterality Date  . masectomy    . TONSILLECTOMY      There were no vitals filed for this visit.   ADL:   Review of cognitive strategies and areas of focus for the last few weeks. Pt reports she is doing well, has been able to implement strategies in her day to manage her calendar and appts.  She has not gotten the day of the week calendar that was recommended but is planning to follow up and try to find one locally.  She reports her daughter will be coming to visit from [place] for a couple weeks.    Therapeutic Exercise:   Patient seen for UB strengthening with use of 2.5# dowel for shoulder flexion, chest press, ABD/ADD, forwards and back wards circles, diagonal patterns, elbow flexion/extension and wrist flex/ext, 12-15 reps for each exercise and performed 2-3 sets of each.  Cues and therapist demo for proper form and technique.    Rest breaks as needed.  Instructed on use of dumbbells for HEP, recommend 1-2# dumbbell weights.    FOTO administered and scored: 78    Response to tx: Pt has continued to progress well, she has been able to implement strategies to help  with knowing the day of the week consistently, managing her calendar, checkbook, etc.  She does have some weakness and was able to participate in UB strengthening exercises with cues and demonstration.  Patient is unsure if she will continue with therapy since her daughter is coming to visit and she is doing well with the other areas of focus.  Recommend patient get 1-2# dumbbells for UB exercise for home program.  Will leave patient chart open for the next few weeks to see if she needs any additional follow up.                          OT Long Term Goals - 11/24/20 0859      OT LONG TERM GOAL #1   Title Patient will demonstrate the ability to manage a calendar with appointments with the awareness of the current day of the week with supervision from her husband.    Baseline Eval: Patient has difficulty identifying the day of the week at times and managing appointments.    Time 12    Period Weeks    Status New    Target Date 02/14/21      OT LONG TERM GOAL #2   Title Patient will demonstrate the ability  to perform light meal preparation with modified independence    Baseline Eval: husband performing majority of meal prep    Time 6    Period Weeks    Status New    Target Date 01/03/21      OT LONG TERM GOAL #3   Title Patient will complete meal preparation utilizing oven with good safety awareness with supervision    Baseline Eval: Husband performing meal prep    Time 12    Period Weeks    Status New    Target Date 02/14/21      OT LONG TERM GOAL #4   Title Patient will demonstrate compensatory strategies for medication management with supervision.    Baseline Eval: Patient has memory issues and sometimes forgets to take medication    Time 12    Period Weeks    Status New    Target Date 02/14/21      OT LONG TERM GOAL #5   Title Patient will demonstrate writing 1 check and necessary math and her check register in less than 8 minutes    Baseline Eval: Patient  requires 20 minutes to complete    Time 12    Period Weeks    Status New    Target Date 02/14/21      Long Term Additional Goals   Additional Long Term Goals Yes      OT LONG TERM GOAL #6   Title Patient will demonstrate home exercise program with modified independence    Baseline No current program    Time 6    Period Weeks    Status New    Target Date 01/03/21      OT LONG TERM GOAL #7   Title Patient will improve bilateral upper body strength by 1 muscle grade to complete necessary daily tasks with good speed and efficiency.    Baseline Decreased strength overall in bilateral upper extremity    Time 12    Period Weeks    Status New    Target Date 02/14/21                 Plan - 01/05/21 1453    Clinical Impression Statement Pt has continued to progress well, she has been able to implement strategies to help with knowing the day of the week consistently, managing her calendar, checkbook, etc.  She does have some weakness and was able to participate in UB strengthening exercises with cues and demonstration.  Patient is unsure if she will continue with therapy since her daughter is coming to visit and she is doing well with the other areas of focus.  Recommend patient get 1-2# dumbbells for UB exercise for home program.  Will leave patient chart open for the next few weeks to see if she needs any additional follow up.    OT Occupational Profile and History Detailed Assessment- Review of Records and additional review of physical, cognitive, psychosocial history related to current functional performance    Occupational performance deficits (Please refer to evaluation for details): ADL's;IADL's;Rest and Sleep;Leisure;Social Participation    Body Structure / Function / Physical Skills ADL;Dexterity;Strength;Balance;Coordination;FMC;IADL;Endurance;UE functional use;Mobility    Cognitive Skills Attention;Memory;Sequencing    Psychosocial Skills Environmental   Adaptations;Habits;Routines and Behaviors    Rehab Potential Good    Clinical Decision Making Several treatment options, min-mod task modification necessary    Comorbidities Affecting Occupational Performance: May have comorbidities impacting occupational performance    Modification or Assistance to Complete Evaluation  No modification  of tasks or assist necessary to complete eval    OT Frequency 2x / week    OT Duration 12 weeks    OT Treatment/Interventions Self-care/ADL training;Therapeutic exercise;DME and/or AE instruction;Functional Mobility Training;Cognitive remediation/compensation;Balance training;Neuromuscular education;Manual Therapy;Moist Heat;Therapeutic activities;Patient/family education;Coping strategies training    Consulted and Agree with Plan of Care Patient;Family member/caregiver    Family Member Consulted husband           Patient will benefit from skilled therapeutic intervention in order to improve the following deficits and impairments:   Body Structure / Function / Physical Skills: ADL,Dexterity,Strength,Balance,Coordination,FMC,IADL,Endurance,UE functional use,Mobility Cognitive Skills: Attention,Memory,Sequencing Psychosocial Skills: Environmental  Adaptations,Habits,Routines and Behaviors   Visit Diagnosis: Muscle weakness (generalized)  Other lack of coordination    Problem List Patient Active Problem List   Diagnosis Date Noted  . Acute hip pain, left   . Hypertension   . Fall   . SAH (subarachnoid hemorrhage) ([place])   . IBS (irritable bowel syndrome)    Simmie Camerer Oneita Jolly, OTR/L, CLT  Deniesha Stenglein 01/05/2021, 4:34 PM  [place] MAIN [facility] SERVICES [address] [place], [place], [address] Phone: [phone]   Fax:  [phone]  Name: Savannah Strong MRN: [id number] Date of Birth: 1941-03-01

## 2021-07-27 ENCOUNTER — Emergency Department
Admission: EM | Admit: 2021-07-27 | Discharge: 2021-07-27 | Disposition: A | Discharge status: Home / Self Care | Payer: Medicare Other | Attending: Emergency Medicine | Admitting: Emergency Medicine

## 2021-07-27 ENCOUNTER — Other Ambulatory Visit: Payer: Self-pay

## 2021-07-27 DIAGNOSIS — F419 Anxiety disorder, unspecified: Secondary | ICD-10-CM | POA: Diagnosis not present

## 2021-07-27 DIAGNOSIS — I10 Essential (primary) hypertension: Secondary | ICD-10-CM | POA: Insufficient documentation

## 2021-07-27 DIAGNOSIS — R0789 Other chest pain: Secondary | ICD-10-CM | POA: Insufficient documentation

## 2021-07-27 DIAGNOSIS — R202 Paresthesia of skin: Secondary | ICD-10-CM | POA: Diagnosis not present

## 2021-07-27 DIAGNOSIS — R42 Dizziness and giddiness: Secondary | ICD-10-CM | POA: Insufficient documentation

## 2021-07-27 DIAGNOSIS — R112 Nausea with vomiting, unspecified: Secondary | ICD-10-CM | POA: Diagnosis not present

## 2021-07-27 DIAGNOSIS — Z87891 Personal history of nicotine dependence: Secondary | ICD-10-CM | POA: Insufficient documentation

## 2021-07-27 DIAGNOSIS — Z853 Personal history of malignant neoplasm of breast: Secondary | ICD-10-CM | POA: Insufficient documentation

## 2021-07-27 LAB — URINALYSIS, COMPLETE (UACMP) WITH MICROSCOPIC
Bilirubin Urine: NEGATIVE
Glucose, UA: NEGATIVE mg/dL
Hgb urine dipstick: NEGATIVE
Ketones, ur: 20 mg/dL — AB
Leukocytes,Ua: NEGATIVE
Nitrite: NEGATIVE
Protein, ur: 100 mg/dL — AB
Specific Gravity, Urine: 1.013 (ref 1.005–1.030)
pH: 7 (ref 5.0–8.0)

## 2021-07-27 LAB — CBC
HCT: 39.7 % (ref 36.0–46.0)
Hemoglobin: 14 g/dL (ref 12.0–15.0)
MCH: 32.2 pg (ref 26.0–34.0)
MCHC: 35.3 g/dL (ref 30.0–36.0)
MCV: 91.3 fL (ref 80.0–100.0)
Platelets: 221 10*3/uL (ref 150–400)
RBC: 4.35 MIL/uL (ref 3.87–5.11)
RDW: 14 % (ref 11.5–15.5)
WBC: 13.1 10*3/uL — ABNORMAL HIGH (ref 4.0–10.5)
nRBC: 0 % (ref 0.0–0.2)

## 2021-07-27 LAB — COMPREHENSIVE METABOLIC PANEL
ALT: 16 U/L (ref 0–44)
AST: 21 U/L (ref 15–41)
Albumin: 4.8 g/dL (ref 3.5–5.0)
Alkaline Phosphatase: 90 U/L (ref 38–126)
Anion gap: 12 (ref 5–15)
BUN: 14 mg/dL (ref 8–23)
CO2: 23 mmol/L (ref 22–32)
Calcium: 9.3 mg/dL (ref 8.9–10.3)
Chloride: 99 mmol/L (ref 98–111)
Creatinine, Ser: 1.19 mg/dL — ABNORMAL HIGH (ref 0.44–1.00)
GFR, Estimated: 46 mL/min — ABNORMAL LOW (ref >60)
Glucose, Bld: 131 mg/dL — ABNORMAL HIGH (ref 70–99)
Potassium: 3.4 mmol/L — ABNORMAL LOW (ref 3.5–5.1)
Sodium: 134 mmol/L — ABNORMAL LOW (ref 135–145)
Total Bilirubin: 0.8 mg/dL (ref 0.3–1.2)
Total Protein: 7.2 g/dL (ref 6.5–8.1)

## 2021-07-27 LAB — TROPONIN I (HIGH SENSITIVITY): Troponin I (High Sensitivity): 3 ng/L (ref <18)

## 2021-07-27 LAB — LIPASE, BLOOD: Lipase: 22 U/L (ref 11–51)

## 2021-07-27 MED ORDER — MECLIZINE HCL 25 MG PO TABS
25.0000 mg | ORAL_TABLET | Freq: Once | ORAL | Status: AC
  Administered 2021-07-27: 25 mg via ORAL
  Filled 2021-07-27: qty 1

## 2021-07-27 MED ORDER — MECLIZINE HCL 25 MG PO TABS: 25.0000 mg | ORAL_TABLET | Freq: Two times a day (BID) | ORAL | 0 refills | Status: AC | PRN

## 2021-07-27 MED ORDER — ONDANSETRON 4 MG PO TBDP
4.0000 mg | ORAL_TABLET | Freq: Once | ORAL | Status: AC | PRN
  Administered 2021-07-27: 4 mg via ORAL
  Filled 2021-07-27: qty 1

## 2021-07-27 NOTE — ED Triage Notes (Signed)
Pt with N/V x 2 hours today. Pt states she thinks the frequency of vomiting is increasing. Pt states she has not been around anyone with illness. Pt pale and diaphoretic. Pt with dry heaves.

## 2021-07-27 NOTE — ED Notes (Signed)
Report given to Amanda RN

## 2021-07-27 NOTE — ED Notes (Signed)
Patient ambulated to the room commode with a steady gait, but c/o  being "swimmy headed."

## 2021-07-27 NOTE — ED Provider Notes (Signed)
[facility] Emergency Department Provider Note  ____________________________________________   I have reviewed the triage vital signs and the nursing notes.   HISTORY  Chief Complaint Emesis   History limited by: Not Limited   HPI Savannah Strong is a 80 y.o. female who presents to the emergency department today because of concerns for some nausea, vomiting as well as some dizziness.  The patient states she was riding in the car with her husband when the symptoms came on fairly suddenly.  They were having an argument.  She states she did notice some tingling in her hands and around her mouth when this happened.  She was taking very shallow breaths at that time.  She did have some chest discomfort.  Patient denies similar symptoms in the past.  At the time my exam she still complaining of some dizziness.  She states it is worse when she tries to stand up or if she moves her head quickly.  Patient denies any recent illness.  Denies any recent fevers.  Records reviewed. Per medical record review patient has a history of HTN.   Past Medical History:  Diagnosis Date   Cancer ([place])    Left breast with mast   Hypertension    IBS (irritable bowel syndrome)    Migraines     Patient Active Problem List   Diagnosis Date Noted   Acute hip pain, left    Hypertension    [facility] (subarachnoid hemorrhage) (HCC)    IBS (irritable bowel syndrome)     Past Surgical History:  Procedure Laterality Date   masectomy     TONSILLECTOMY      Prior to Admission medications   Medication Sig Start Date End Date Taking? Authorizing Provider  acetaminophen (TYLENOL) 500 MG tablet Take 500 mg by mouth 3 (three) times daily.    [provider]  albuterol (VENTOLIN HFA) 108 (90 Base) MCG/ACT inhaler Inhale 2 puffs into the lungs every 4 (four) hours as needed for wheezing.    [provider]  Beclomethasone Dipropionate 80 MCG/ACT AERS Place 2 sprays into the  nose daily. 12/26/17   [provider]  cholecalciferol (VITAMIN D) 25 MCG (1000 UNIT) tablet Take 1,000 Units by mouth daily.    [provider]  fluticasone (FLONASE) 50 MCG/ACT nasal spray Place 2 sprays into both nostrils daily.    [provider]  fluticasone furoate-vilanterol (BREO ELLIPTA) 100-25 MCG/INH AEPB Inhale 1 puff into the lungs daily.     [provider]  levETIRAcetam (KEPPRA) 250 MG tablet Take 1 tablet (250 mg total) by mouth 2 (two) times daily for 7 days. 02/24/20 03/02/20  Vanessa [place], MD  lidocaine (LIDODERM) 5 % Place 1 patch onto the skin daily. Remove & Discard patch within 12 hours or as directed by MD 02/26/20   Fritzi Mandes, MD  loperamide (IMODIUM) 2 MG capsule Take 2 mg by mouth as needed for diarrhea or loose stools.    [provider]  loratadine (CLARITIN) 10 MG tablet Take 10 mg by mouth daily.    [provider]  methocarbamol (ROBAXIN) 500 MG tablet Take 1 tablet (500 mg total) by mouth every 8 (eight) hours as needed for muscle spasms. 02/26/20   Fritzi Mandes, MD  ondansetron (ZOFRAN ODT) 4 MG disintegrating tablet Take 1 tablet (4 mg total) by mouth every 8 (eight) hours as needed for nausea or vomiting. 09/15/15   Joanne Gavel, MD  venlafaxine XR (EFFEXOR-XR) 37.5 MG 24 hr capsule Take 37.5-75 mg by mouth See admin instructions. Take 1 capsule (37.5mg ) by mouth every other day and take 2 capsules (75mg ) by mouth every alternate day    [provider]    Allergies Sulfa antibiotics  Family History  Problem Relation Age of Onset   Stomach cancer Mother     Social History Social History   Tobacco Use   Smoking status: Former   Smokeless tobacco: Never  Scientific laboratory technician Use: Never used  Substance Use Topics   Alcohol use: Yes    Alcohol/week: 1.0 standard drink    Types: 1 Glasses of wine per week    Comment: one glass of wine per night    Drug use: No    Review of  Systems Constitutional: No fever/chills Eyes: No visual changes. ENT: No sore throat. Cardiovascular: Positive for chest pain. Respiratory: Denies shortness of breath. Gastrointestinal: Positive for nausea and vomiting.  Genitourinary: Negative for dysuria. Musculoskeletal: Negative for back pain. Skin: Negative for rash. Neurological: Positive for dizziness.  ____________________________________________   PHYSICAL EXAM:  VITAL SIGNS: ED Triage Vitals  Enc Vitals Group     BP 07/27/21 1805 (!) 187/96     Pulse Rate 07/27/21 1803 90     Resp 07/27/21 1803 20     Temp 07/27/21 1803 98.4 F (36.9 C)     Temp Source 07/27/21 1803 Oral     SpO2 07/27/21 1803 97 %     Weight 07/27/21 1804 175 lb (79.4 kg)     Height 07/27/21 1804 5\' 8"  (1.727 m)     Head Circumference --      Peak Flow --      Pain Score 07/27/21 1804 0   Constitutional: Alert and oriented.  Eyes: Conjunctivae are normal.  ENT      Head: Normocephalic and atraumatic.      Nose: No congestion/rhinnorhea.      Mouth/Throat: Mucous membranes are moist.      Neck: No stridor. Hematological/Lymphatic/Immunilogical: No cervical lymphadenopathy. Cardiovascular: Normal rate, regular rhythm.  No murmurs, rubs, or gallops.  Respiratory: Normal respiratory effort without tachypnea nor retractions. Breath sounds are clear and equal bilaterally. No wheezes/rales/rhonchi. Gastrointestinal: Soft and non tender. No rebound. No guarding.  Genitourinary: Deferred Musculoskeletal: Normal range of motion in all extremities. No lower extremity edema. Neurologic:  Normal speech and language. No gross focal neurologic deficits are appreciated.  Skin:  Skin is warm, dry and intact. No rash noted. Psychiatric: Mood and affect are normal. Speech and behavior are normal. Patient exhibits appropriate insight and judgment.  ____________________________________________    LABS (pertinent positives/negatives)  Lipase 22 CBC wbc  13.1, hgb 14.0, plt 221 CMP na 134, k 3.4, glu 131, cr 1.19 Trop has 3 ____________________________________________   EKG  I, Nance Pear, attending physician, personally viewed and interpreted this EKG  EKG Time: 1812 Rate: 80 Rhythm: normal sinus rhythm Axis: left axis deviation Intervals: qtc 456 QRS: narrow ST changes: no st elevation Impression: abnormal ekg  ____________________________________________    RADIOLOGY  None  ____________________________________________   PROCEDURES  Procedures  ____________________________________________   INITIAL IMPRESSION / ASSESSMENT AND PLAN / ED COURSE  Pertinent labs & imaging results that were available during my care of the patient were reviewed by me and considered in my medical decision making (see chart for details).   Patient presented to the emergency department today because of concerns for nausea, vomiting and she  also describes some dizziness.  She did have some chest pain with the symptoms started.  Patient's description of the dizziness with it being worse with head movement as well as standing up but did have some concerns for vertigo.  Patient was given a meclizine and did feel significant improvement.  The chest pain EKG without any ST elevation.  Troponin was negative.  At this time I have very low suspicion for ACS.  Does not sound like the patient might of had anxiety attack with the tingling in the arms, around her mouth and that she breathing.  Given that patient felt better will plan on discharging.  Did discuss follow-up with ENT.  ___________________________________________   FINAL CLINICAL IMPRESSION(S) / ED DIAGNOSES  Final diagnoses:  Vertigo  Anxiety     Note: This dictation was prepared with Dragon dictation. Any transcriptional errors that result from this process are unintentional     Nance Pear, MD 07/27/21 [phone]

## 2021-07-27 NOTE — Discharge Instructions (Addendum)
Please seek medical attention for any high fevers, chest pain, shortness of breath, change in behavior, persistent vomiting, bloody stool or any other new or concerning symptoms.  

## 2021-07-27 NOTE — ED Notes (Signed)
Lab called to add on troponin 

## 2021-07-27 NOTE — ED Notes (Signed)
Pt able to ambulate to toilet in room and void sans complications. States she feels back to baseline.

## 2021-07-27 NOTE — ED Notes (Signed)
Patient c/o increased nausea while standing.

## 2022-02-06 ENCOUNTER — Ambulatory Visit: Payer: MEDICARE | Attending: Geriatric Medicine

## 2022-02-06 DIAGNOSIS — R2689 Other abnormalities of gait and mobility: Secondary | ICD-10-CM

## 2022-02-06 DIAGNOSIS — G3184 Mild cognitive impairment, so stated: Secondary | ICD-10-CM

## 2022-02-06 DIAGNOSIS — H9193 Unspecified hearing loss, bilateral: Secondary | ICD-10-CM

## 2022-02-06 DIAGNOSIS — C7A8 Other malignant neuroendocrine tumors: Secondary | ICD-10-CM

## 2022-02-06 DIAGNOSIS — F320 Major depressive disorder, single episode, mild: Secondary | ICD-10-CM

## 2022-02-06 DIAGNOSIS — Z79899 Other long term (current) drug therapy: Secondary | ICD-10-CM

## 2022-02-06 DIAGNOSIS — F411 Generalized anxiety disorder: Secondary | ICD-10-CM

## 2022-02-06 DIAGNOSIS — Z7409 Other reduced mobility: Secondary | ICD-10-CM

## 2022-02-06 DIAGNOSIS — Z9181 History of falling: Secondary | ICD-10-CM

## 2022-02-06 DIAGNOSIS — F39 Unspecified mood [affective] disorder: Secondary | ICD-10-CM

## 2022-02-06 DIAGNOSIS — G4733 Obstructive sleep apnea (adult) (pediatric): Secondary | ICD-10-CM

## 2022-02-06 DIAGNOSIS — Z0189 Encounter for other specified special examinations: Secondary | ICD-10-CM

## 2022-02-06 NOTE — Progress Notes
PATIENT: Jodi Bentley  MRN: [id number]  DOB: [birth year]  DATE OF SERVICE: 02/06/2022    REFERRING PRACTITIONER: No ref. provider found  PRIMARY CARE PROVIDER: No primary care provider on file.    REASON FOR REFERRAL:     CHIEF COMPLAINT:   Chief Complaint   Patient presents with   ? Establish Care        Subjective:      Jodi Bentley is a 81 y.o. 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 Had care at [facility]    Memory problems- worsening after the fall in 5/21 on rivastigmine 3 mg BID - started in 3/22. Had CSF studies done - suggestive for AD    Had Brain MRI 2/22 IMPRESSION: Cerebral atrophy without evidence of acute intracranial   abnormality. Although there is prominent dilation of lateral ventricles,   overall features do not favor NPH.     Was diagnosed in 2021: moderate obstructive sleep apnea with an AHI of 16.4 and severe PLMI of 174.3.  Not using CPAP    Had a fall in 02/2020 c/b SDH, and family noticed more memory problems since then    Was diagnosed with a neuroendocrine tumor in 11/22, and had surgery in 1/23    Balance problems        No past medical history on file.  No past surgical history on file.  No family history on file.  Social History     Socioeconomic History   ? Marital status: Unknown   Tobacco Use   ? Smoking status: Former     Types: Cigarettes   ? Smokeless tobacco: Never     Outpatient Medications Prior to Visit   Medication Sig   ? cyanocobalamin 1000 MCG tablet Take 1 tablet (1,000 mcg total) by mouth daily.   ? hydrOXYzine 25 mg tablet Take 1 tablet (25 mg total) by mouth.   ? loratadine 10 mg tablet Take 1 tablet (10 mg total) by mouth.   ? pantoprazole 40 mg DR tablet    ? rivastigmine 3 mg capsule    ? venlafaxine 150 mg 24 hr capsule    ? venlafaxine 37.5 mg 24 hr capsule      No facility-administered medications prior to visit.     Allergies   Allergen Reactions   ? Ace Inhibitors Cough     Other reaction(s): Other (See Comments)  cough  cough  cough     ? Sulfasalazine    ? Sulfa Antibiotics Rash       Review of Systems:   A 14-system review of systems was performed and is negative except as stated in the history of present illness.     Objective:        Vitals:  Wt 167 lb (75.8 kg)    General:   alert, appears stated age and cooperative   Skin:   Skin color, texture, turgor normal. No rashes or lesions   Head:   Normocephalic, without obvious abnormality, atraumatic   Eyes:   conjunctivae/corneas clear. PERRL, EOM's intact. Fundi benign.   Ears:   normal TM's and external ear canals both ears   Nose:  Nares normal. Septum midline. Mucosa normal. No drainage or sinus tenderness.   Throat:  lips, mucosa, and tongue normal; teeth and gums normal   Mouth:   No perioral or gingival cyanosis or lesions.  Tongue is normal in appearance.  Neck:  no adenopathy, no carotid bruit, no JVD, supple, symmetrical, trachea midline and thyroid not enlarged, symmetric, no tenderness/mass/nodules   Back:  symmetric, no curvature. ROM normal. No CVA tenderness.   Lungs:   clear to auscultation bilaterally   Heart:   regular rate and rhythm, S1, S2 normal, no murmur, click, rub or gallop   Abdomen:   soft, non-tender; bowel sounds normal; no masses,  no organomegaly   GU:   defer exam   Musculoskeletal:  negative   Extremities:  extremities normal, atraumatic, no cyanosis or edema   Pulses:       2+ and symmetric   Lymph Nodes:  cervical, supraclavicular, and axillary nodes normal.   Neurologic:   gait impaired, shuffling, and noted imbalance with turns   Psychiatric:   oriented x 3; affect is suggestive for anxiety and irritability     Lab Review:   not applicable        Assessment & Plan:        1. Encounter for geriatric assessment      2. Other malignant neuroendocrine tumors (HCC/RAF)  - Referral to Endocrinology    3. 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 On low dose rivastigmine 3 mg BID that was not upitrated since 3/22.   - Referral to Neurology  - Folate,Serum; Future  - Homocysteine, Total; Future  - Methylmalonic Acid,Serum; Future  - Vitamin B12; Future  - Vitamin D,25-Hydroxy; Future  - MR brain wo contrast; Future    5. Balance problem  Gait/Balance;   Qualitative gait assessment   ?Step length (heels do not clear toes of other foot)  ?Step height (heels do not clear floor)  ?Hesitancy - yes  ?Sway - yes  ?Symmetry  ?Continuity  ?Path deviation - yes  Gait speed -- correlates with risk for recurrent falls as well as frailty and survival   TUG positive if >20 seconds for 10 meter  Balance test: poor stability during a 360-degree turn    Plan:   - Lab work up: CBC with peripheral smear, BMP, CMP, Mg, vit D, ESR, CRP, TSH, vit B12, RPR, vit E, serum Copper  - consider evaluation for peripheral neuropathy:    - CBC, ESR, CMP, A1c, TSH, TSH, vit B12, RPR, ANA, hepatitis B and C screen, immunoelectrophoresis   - CXR   - SPEP/UPEP   - EMG and NCT   - monofilament test  - PT/OT referral for gait and balance training, muscle strengthening - quadriceps strengthening provided at least 3 times per week, treadmill training to improve gait speed and walking endurance, and individual tailored exercise program  - supplement Calcium and vit D for fall prevention  - if indicated, use of orthoses and other mobility aids  - well-fitting walking shoes with low heels, and thin firm soles recommended to maximize balance and improve gait  - Referral to Neurology  - Vitamin D,25-Hydroxy; Future  - Referral to [facility], Physical Therapy  - MR brain wo contrast; Future    6. Impaired mobility  - Referral to Neurology  - Vitamin D,25-Hydroxy; Future  - Referral to [facility], Physical Therapy  - MR brain wo contrast; Future    7. Severe hearing loss of both ears      8. Unspecified mood (affective) disorder (HCC/RAF)  - Increase venlafaxine to 150 and 2 capsules of 37.5  mg- total dose of 225 mg      9. GAD (generalized anxiety disorder)  - Referral to [facility]    10. Mild major depression (HCC/RAF)  - Referral to [facility]    11. OSA (obstructive sleep apnea)  - discussed the possibility of worsening MCI/dementia with untreated OSA  - Referral for Sleep Consultation    12. Medication management  Current Outpatient Medications   Medication Instructions   ? cyanocobalamin 1,000 mcg, Oral, Daily   ? hydrOXYzine 25 mg, Oral   ? loratadine (CLARITIN) 10 mg, Oral   ? pantoprazole 40 mg DR tablet No dose, route, or frequency recorded.   ? rivastigmine 3 mg capsule No dose, route, or frequency recorded.   ? venlafaxine 150 mg 24 hr capsule No dose, route, or frequency recorded.   ? venlafaxine 37.5 mg 24 hr capsule No dose, route, or frequency recorded.   - recommend stopping hydroxyzine given strong anticholinergic effect  - CBC & Auto Differential; Future  - Hgb A1c; Future  - Lipid Panel; Future  - TSH with reflex FT4, FT3; Future  - Comprehensive Metabolic Panel; Future  - Folate,Serum; Future  - Homocysteine, Total; Future  - Methylmalonic Acid,Serum; Future  - Vitamin B12; Future  - Vitamin D,25-Hydroxy; Future  - Urinalysis Routine; Future  - Bacterial Culture Urine; Future  - Bacterial Culture Urine  - Urinalysis Routine           INTERACTION COMPLEXITY and SOCIAL DETERMINANTS of HEALTH     PROBLEM COMPLEXITY   [x]  New problem with uncertain diagnosis or prognosis (moderate)   []  Multiple stable chronic problems (moderate)   [x]  Chronic problem not stable - not controlled, symptomatic, or worsening (moderate)   [x]  Severe exacerbation of chronic problem (high)   []  New or chronic problem that poses threat to life or bodily function (high)     MANAGEMENT COMPLEXITY   [x]  Old or external/outside records reviewed   [x]  Discussion with alternate (proxy) if patient with impaired communication / comprehension ability (e.g., dementia, aphasia, severe hearing loss).   [x]  Repeated questions (or disagreement) between patient and/among caregivers/family during the visit.  []  Caregiver/patient emotions/behavior/beliefs interfering with implementation of treatment plan.  []  Independent interpretation of test (EKG, Chest XRay)   []  Discussion of case with a consultant physician     RISK LEVEL   [x]  Prescription drug management (moderate)   []  Minor surgery with CV risk factors or elective major surgery (moderate)   []  Dx or Rx significantly limited by SDoH (inadequate housing, living alone, poor health care access, inappropriate diet; low literacy) (moderate)   []  Major surgery - elective with CV risk factors or emergent (high)   []  Need for hospitalization (high)   []  New DNR or de-escalation of care (high)      SDoH  The diagnosis or treatment of said conditions is significantly limited by the following social determinants of health:  []  Z59.0 Homelessness  []  Z59.1 Inadequate housing  []  Z59.2 Discord with neighbors, lodgers and landlord  []  Z60.2 Problems related to living alone  []  Z59.8 Other problems related to housing and economic circumstances  []  Z59.4 Lack of adequate food and safe drinking water  []  Z59.6 Low income  []  Z59.7 Insufficient social insurance and welfare support  []  Z59.9 Problems related to housing and economic circumstances, unspecified  []  Z75.3 Unavailability and inaccessibility of health care facilities  []  Z75.4 Unavailability and inaccessibility of other helping agencies  []   Z72.4 Inappropriate diet and eating habits  []  Z62.820 Parent-biological child conflict  []  Z63.8 Other specified problems related to primary support group  []  Z55.0 Illiteracy and low level literacy   []  Z56.9 Unspecified problems related to employment      Author:  Gillis Santa I. Birney Belshe 02/06/2022 1:09 PM

## 2022-02-06 NOTE — Patient Instructions
Increase venlafaxine to 150 and 2 capsules of 37.5 mg    For gait and balance  - use under desk elliptical 20 min/day  - chair exercises with 1 lbs weights do arm extensions pushes up and down to the chest level  - sit to stand 10 times at one time repeat 3-4 times/day     Brain MRI  [facility]  facility in [place]: CT, MRI, ultrasound and Xray  [address]  [place], [place] [address]  Appointment Scheduling:  [phone]: Central Scheduling (Diagnostic)  FREE patient parking      Orders Placed This Encounter    MR brain wo contrast    CBC & Auto Differential    Hgb A1c    Lipid Panel    TSH with reflex FT4, FT3    Comprehensive Metabolic Panel    Folate,Serum    Homocysteine, Total    Methylmalonic Acid,Serum    Vitamin B12    Vitamin D,25-Hydroxy    Referral to Endocrinology    Referral to Neurology    Referral for Sleep Consultation    Referral to [facility]    Referral to [facility], Physical Therapy    pantoprazole 40 mg DR tablet    rivastigmine 3 mg capsule    venlafaxine 150 mg 24 hr capsule    venlafaxine 37.5 mg 24 hr capsule    loratadine 10 mg tablet    hydrOXYzine 25 mg tablet    cyanocobalamin 1000 MCG tablet      Referral to Neurology Dr Vivi Ferns   [phone]   [facility]   [address]  [place], [place] [address]    For sleep apnea - call Dr Lucia Gaskins Sleep medicine [facility]  Primary & Specialty Care  [address], [address]  [place], [place] [address]  [phone] phone  [phone] fax  [email or web address]

## 2022-02-07 LAB — UA,Microscopic: SQUAMOUS EPITHELIAL CELLS: 47 {cells}/uL — ABNORMAL HIGH (ref 0–17)

## 2022-02-07 LAB — UA,Dipstick: SPECIFIC GRAVITY: 1.012 (ref 1.005–1.030)

## 2022-02-13 MED ORDER — VENLAFAXINE HCL ER 150 MG PO CP24: Start: 2022-02-13 — End: ?

## 2022-02-13 MED ORDER — LORATADINE 10 MG PO TABS
10 mg | ORAL
Start: 2022-02-13 — End: ?

## 2022-02-13 MED ORDER — VENLAFAXINE HCL ER 37.5 MG PO CP24: Start: 2022-02-13 — End: ?

## 2022-02-13 MED ORDER — RIVASTIGMINE TARTRATE 3 MG PO CAPS: Start: 2022-02-13 — End: ?

## 2022-02-13 MED ORDER — PANTOPRAZOLE SODIUM 40 MG PO TBEC: Start: 2022-02-13 — End: ?

## 2022-02-15 MED ORDER — LORATADINE 10 MG PO TABS
10 mg | ORAL_TABLET | Freq: Every day | ORAL | 3 refills
Start: 2022-02-15 — End: ?

## 2022-02-15 MED ORDER — VENLAFAXINE HCL ER 37.5 MG PO CP24
75 mg | ORAL_CAPSULE | Freq: Every day | ORAL | 3 refills | Status: AC
Start: 2022-02-15 — End: 2022-02-25

## 2022-02-15 MED ORDER — RIVASTIGMINE TARTRATE 3 MG PO CAPS
3 mg | ORAL_CAPSULE | Freq: Two times a day (BID) | ORAL | 3 refills
Start: 2022-02-15 — End: ?

## 2022-02-15 MED ORDER — PANTOPRAZOLE SODIUM 40 MG PO TBEC
40 mg | ORAL_TABLET | Freq: Every day | ORAL | 3 refills
Start: 2022-02-15 — End: ?

## 2022-02-15 MED ORDER — VENLAFAXINE HCL ER 150 MG PO CP24
150 mg | ORAL_CAPSULE | Freq: Every day | ORAL | 3 refills | Status: AC
Start: 2022-02-15 — End: ?

## 2022-02-19 MED ORDER — LORATADINE 10 MG PO TABS
10 mg | ORAL_TABLET | Freq: Every day | ORAL | 3 refills | Status: AC
Start: 2022-02-19 — End: ?

## 2022-02-19 MED ORDER — RIVASTIGMINE TARTRATE 3 MG PO CAPS
3 mg | ORAL_CAPSULE | Freq: Two times a day (BID) | ORAL | 3 refills | Status: AC
Start: 2022-02-19 — End: ?

## 2022-02-19 MED ORDER — PANTOPRAZOLE SODIUM 40 MG PO TBEC
40 mg | ORAL_TABLET | Freq: Every day | ORAL | 3 refills | Status: AC
Start: 2022-02-19 — End: ?

## 2022-02-20 ENCOUNTER — Telehealth: Payer: MEDICARE

## 2022-02-20 NOTE — Telephone Encounter
Message to Practice/Provider      Message:   Patient's spouse Delfino Lovett calling stating he has been in contact with [facility] and they have informed him they have not received any prescription for Venlafaxine 37.5 mg.  CC shows prescription sent 02/15/2022 but Delfino Lovett states [facility] does not have it. Richard is requesting we resend the prescription to [facility].  Return call is not being requested by the patient or caller.    Patient or caller has been notified of the turnaround time of 1-2 business day(s).

## 2022-02-21 NOTE — Telephone Encounter
Can we resend Rx to [facility]. Pharmacy stating script was not received.

## 2022-02-24 ENCOUNTER — Ambulatory Visit: Payer: MEDICARE

## 2022-02-25 MED ORDER — VENLAFAXINE HCL ER 37.5 MG PO CP24
75 mg | ORAL_CAPSULE | Freq: Every day | ORAL | 3 refills | Status: AC
Start: 2022-02-25 — End: ?

## 2022-02-26 ENCOUNTER — Ambulatory Visit: Payer: TRICARE (CHAMPUS)

## 2022-02-26 ENCOUNTER — Telehealth: Payer: TRICARE (CHAMPUS)

## 2022-02-26 NOTE — Telephone Encounter
Abnormal result for urinalysis  from 02/06/2022

## 2022-02-26 NOTE — Telephone Encounter
Spoke to husband about [facility] activation  To receive referral notice.    Please send referral once account it activated.

## 2022-02-26 NOTE — Telephone Encounter
Call Back Request      Reason for call back: Patient spouse calling to get advise form the doctor .Patiens last urin analysis did show abnormal ,patients spouse wants to know if doctor will prescribe medication .  Any Symptoms:  '[]'$  Yes  '[x]'$  No       If yes, what symptoms are you experiencing:    o Duration of symptoms (how long):    o Have you taken medication for symptoms (OTC or Rx):      If call was taken outside of clinic hours:    '[]'$ Patient or caller has been notified that this message was sent outside of normal clinic hours.     '[]'$ Patient or caller has been warm transferred to the physician's answering service. If applicable, patient or caller informed to please call us back if symptoms progress.  Patient or caller has been notified of the turnaround time of 1-2 business day(s).

## 2022-03-13 ENCOUNTER — Inpatient Hospital Stay: Payer: MEDICARE | Attending: Geriatric Medicine

## 2022-03-13 DIAGNOSIS — R2689 Other abnormalities of gait and mobility: Secondary | ICD-10-CM

## 2022-03-13 DIAGNOSIS — Z9181 History of falling: Secondary | ICD-10-CM

## 2022-03-13 DIAGNOSIS — Z7409 Other reduced mobility: Secondary | ICD-10-CM

## 2022-03-13 DIAGNOSIS — G3184 Mild cognitive impairment, so stated: Secondary | ICD-10-CM

## 2022-03-23 ENCOUNTER — Ambulatory Visit: Payer: TRICARE (CHAMPUS)

## 2022-03-23 DIAGNOSIS — R9089 Other abnormal findings on diagnostic imaging of central nervous system: Secondary | ICD-10-CM

## 2022-03-27 ENCOUNTER — Ambulatory Visit: Payer: TRICARE (CHAMPUS) | Attending: Geriatric Medicine

## 2022-03-27 ENCOUNTER — Telehealth: Payer: MEDICARE

## 2022-03-27 DIAGNOSIS — G901 Familial dysautonomia [Riley-Day]: Secondary | ICD-10-CM

## 2022-03-27 DIAGNOSIS — R300 Dysuria: Secondary | ICD-10-CM

## 2022-03-27 DIAGNOSIS — R9089 Other abnormal findings on diagnostic imaging of central nervous system: Secondary | ICD-10-CM

## 2022-03-27 DIAGNOSIS — H9193 Unspecified hearing loss, bilateral: Secondary | ICD-10-CM

## 2022-03-27 DIAGNOSIS — R42 Dizziness and giddiness: Secondary | ICD-10-CM

## 2022-03-27 NOTE — Patient Instructions
I will refer Jodi Bentley to neurology Dr Ranelle Oyster for evaluation of Normal Pressure Hydrocephalus    For sleep apnea - call Dr Patrica Duel Sleep medicine [facility]  Primary & Specialty Care  [address] [place], [place], [place] [address]  [phone] phone  [phone] fax  [email or web address]   Stop atarax    For gait and balance/conditioning  - use under desk elliptical 20 min/day  - chair exercises with 1 lbs weights do arm extensions pushes up and down to the chest level    Compression stockings and abdominal binder while upright

## 2022-03-27 NOTE — Progress Notes
PATIENT: Jodi Bentley  MRN: [id number]  DOB: 1940/10/29  DATE OF SERVICE: 03/27/2022    REFERRING PRACTITIONER: No ref. provider found  PRIMARY CARE PROVIDER: Linward Headland., MD    REASON FOR REFERRAL:     CHIEF COMPLAINT:   Chief Complaint   Patient presents with   ? Dizziness   ? Hypertension        Subjective:      Jodi Bentley is a 81 y.o. female here with [name] and husband - retired Sport and exercise psychologist    Lives at [place] - moved in 01/20/22 from [place]. Had care at [facility]    Memory problems- worsening after the fall in 5/21 on rivastigmine 3 mg BID - started in 3/22. Had CSF studies done - suggestive for AD    Had Brain MRI 2/22 IMPRESSION: Cerebral atrophy without evidence of acute intracranial   abnormality. Although there is prominent dilation of lateral ventricles,   overall features do not favor NPH.     Was diagnosed in 2021: moderate obstructive sleep apnea with an AHI of 16.4 and severe PLMI of 174.3.  Not using CPAP    Had a fall in 02/2020 c/b SDH, and family noticed more memory problems since then    Was diagnosed with a neuroendocrine tumor in 11/22, and had surgery in 1/23    Balance problems    03/27/22  - feels dizzy unclear if just today or if it has been for days/weeks - orthostatic hypotension in the office lying 174/88 HR 88, sitting 131/83 HR 81 and standing 127/84 and HR 81   - undergoing PT  - not using under desk elliptical  - not using CPAP  - took hydroxyzine just today because she is anxious to go the doctor's appointment    ACC/AHA Cholesterol Management Guideline Recommendations:   Not applicable      ASCVD Risk Score    10-year ASCVD risk  -1 as of 3:46 PM on 03/27/2022.   10-year ASCVD risk with optimal risk factors  cannot be calculated.   Values used to calculate ASCVD Risk Score    Age  73 y.o. Cannot calculate risk because age is not between 26 and 70 years old.   Gender  female   Race  White   HDL Cholesterol  82 mg/dL. (measured on 02/06/2022)   LDL Cholesterol  136 mg/dL. (measured on 02/06/2022) Total Cholesterol  236 mg/dL. (measured on 02/06/2022)   Systolic Blood Pressure  127 mm Hg. (measured on 03/27/2022)   Blood Pressure Medication Present  No   Smoking Status  currently not a smoker   Diabetes Present  No     Click here for the [facility] ASCVD Cardiovascular Risk Estimator Plus tool Office manager).        No past medical history on file.  No past surgical history on file.  No family history on file.  Social History     Socioeconomic History   ? Marital status: Unknown   Tobacco Use   ? Smoking status: Former     Types: Cigarettes   ? Smokeless tobacco: Never     Outpatient Medications Prior to Visit   Medication Sig   ? cyanocobalamin 1000 MCG tablet Take 1 tablet (1,000 mcg total) by mouth daily. (Patient not taking: Reported on 03/27/2022.)   ? loratadine 10 mg tablet Take 1 tablet (10 mg total) by mouth daily.   ? pantoprazole 40 mg DR tablet Take 1 tablet (40 mg total) by mouth daily.   ?  rivastigmine 3 mg capsule Take 1 capsule (3 mg total) by mouth two (2) times daily.   ? venlafaxine 150 mg 24 hr capsule Take 1 capsule (150 mg total) by mouth daily with breakfast.   ? venlafaxine 37.5 mg 24 hr capsule Take 2 capsules (75 mg total) by mouth daily with breakfast With 150mg  for total 225mg  daily.     No facility-administered medications prior to visit.     Allergies   Allergen Reactions   ? Ace Inhibitors Cough     Other reaction(s): Other (See Comments)  cough  cough  cough     ? Sulfasalazine    ? Sulfa Antibiotics Rash       Review of Systems:   A 14-system review of systems was performed and is negative except as stated in the history of present illness.     Objective:        Vitals:  BP 127/84 (BP Location: Right arm, Patient Position: Standing, Cuff Size: Regular)  ~ Pulse 81  ~ Temp 36.6 ?C (97.8 ?F) (Tympanic)  ~ Resp 22  ~ Wt 170 lb (77.1 kg)  ~ SpO2 96%    General:   alert, appears stated age and cooperative   Skin:   Skin color, texture, turgor normal. No rashes or lesions   Head: Normocephalic, without obvious abnormality, atraumatic   Eyes:   conjunctivae/corneas clear. PERRL, EOM's intact. Fundi benign.   Ears:   normal TM's and external ear canals both ears   Nose:  Nares normal. Septum midline. Mucosa normal. No drainage or sinus tenderness.   Throat:  lips, mucosa, and tongue normal; teeth and gums normal   Mouth:   No perioral or gingival cyanosis or lesions.  Tongue is normal in appearance.   Neck:  no adenopathy, no carotid bruit, no JVD, supple, symmetrical, trachea midline and thyroid not enlarged, symmetric, no tenderness/mass/nodules   Back:  symmetric, no curvature. ROM normal. No CVA tenderness.   Lungs:   clear to auscultation bilaterally   Heart:   regular rate and rhythm, S1, S2 normal, no murmur, click, rub or gallop   Abdomen:   soft, non-tender; bowel sounds normal; no masses,  no organomegaly   GU:   defer exam   Musculoskeletal:  negative   Extremities:  extremities normal, atraumatic, no cyanosis or edema   Pulses:       2+ and symmetric   Lymph Nodes:  cervical, supraclavicular, and axillary nodes normal.   Neurologic:   gait impaired, shuffling, and noted imbalance with turns   Psychiatric:   oriented x 3; affect is suggestive for anxiety and irritability     Lab Review:   not applicable        Assessment & Plan:        1. Dizziness. With orthostatic hypotension and most likely dehydration. Possible medical cause of infection - per chart review had E coli UTI in 2/23  Dizziness evaluation:  - Orthostatic VS positive  - Hearing impaired not wearing HA  - Bacterial Culture Urine; Future  - Urinalysis Routine; Future    2. Dysautonomia (HCC/RAF). Most likely multifactorial - neurodegenerative disorder, dehydration, medications, aging  Compression stockings and abdominal binder while upright  Water intake 48 oz daily  Check BP sitting and standing up within 3 min      3. Abnormal brain MRI  - referral to Dr Raynelle Dick to evaluate for NPH    4. Severe hearing loss of both ears  -  recommend wearing hearing aids or using pocket talker      5. Unspecified mood (affective) disorder (HCC/RAF). On SNRI venlafaxine 150+37.5 mg      6. Dysuria  - Bacterial Culture Urine; Future  - Urinalysis Routine; Future      Other malignant neuroendocrine tumors (HCC/RAF)  - Referral to Endocrinology    History of fall  - Referral to Neurology  - MR brain wo contrast; Future    Amnestic MCI (mild cognitive impairment with memory loss). On low dose rivastigmine 3 mg BID that was not upitrated since 3/22.   - Referral to Neurology  - Folate,Serum; Future  - Homocysteine, Total; Future  - Methylmalonic Acid,Serum; Future  - Vitamin B12; Future  - Vitamin D,25-Hydroxy; Future  - MR brain wo contrast; Future    Balance problem  Gait/Balance;   Qualitative gait assessment   ?Step length (heels do not clear toes of other foot)  ?Step height (heels do not clear floor)  ?Hesitancy - yes  ?Sway - yes  ?Symmetry  ?Continuity  ?Path deviation - yes  Gait speed -- correlates with risk for recurrent falls as well as frailty and survival   TUG positive if >20 seconds for 10 meter  Balance test: poor stability during a 360-degree turn    Plan:   - Lab work up: CBC with peripheral smear, BMP, CMP, Mg, vit D, ESR, CRP, TSH, vit B12, RPR, vit E, serum Copper  - consider evaluation for peripheral neuropathy:    - CBC, ESR, CMP, A1c, TSH, TSH, vit B12, RPR, ANA, hepatitis B and C screen, immunoelectrophoresis   - CXR   - SPEP/UPEP   - EMG and NCT   - monofilament test  - PT/OT referral for gait and balance training, muscle strengthening - quadriceps strengthening provided at least 3 times per week, treadmill training to improve gait speed and walking endurance, and individual tailored exercise program  - supplement Calcium and vit D for fall prevention  - if indicated, use of orthoses and other mobility aids  - well-fitting walking shoes with low heels, and thin firm soles recommended to maximize balance and improve gait  - Referral to Neurology  - Vitamin D,25-Hydroxy; Future  - Referral to [facility], Physical Therapy  - MR brain wo contrast; Future    Impaired mobility  - Referral to Neurology  - Vitamin D,25-Hydroxy; Future  - Referral to [facility], Physical Therapy  - MR brain wo contrast; Future    Unspecified mood (affective) disorder (HCC/RAF)  - Increase venlafaxine to 150 and 2 capsules of 37.5 mg- total dose of 225 mg    GAD (generalized anxiety disorder)  - Referral to [facility]    Mild major depression (HCC/RAF)  - Referral to [facility]    OSA (obstructive sleep apnea)  - discussed the possibility of worsening MCI/dementia with untreated OSA  - Referral for Sleep Consultation  - recommend stopping hydroxyzine given strong anticholinergic effect  - CBC & Auto Differential; Future  - Hgb A1c; Future  - Lipid Panel; Future  - TSH with reflex FT4, FT3; Future  - Comprehensive Metabolic Panel; Future  - Folate,Serum; Future  - Homocysteine, Total; Future  - Methylmalonic Acid,Serum; Future  - Vitamin B12; Future  - Vitamin D,25-Hydroxy; Future  - Urinalysis Routine; Future  - Bacterial Culture Urine; Future  - Bacterial Culture Urine  - Urinalysis Routine           INTERACTION COMPLEXITY and SOCIAL  DETERMINANTS of HEALTH     PROBLEM COMPLEXITY   [x]  New problem with uncertain diagnosis or prognosis (moderate)   []  Multiple stable chronic problems (moderate)   [x]  Chronic problem not stable - not controlled, symptomatic, or worsening (moderate)   [x]  Severe exacerbation of chronic problem (high)   []  New or chronic problem that poses threat to life or bodily function (high)     MANAGEMENT COMPLEXITY   [x]  Old or external/outside records reviewed   [x]  Discussion with alternate (proxy) if patient with impaired communication / comprehension ability (e.g., dementia, aphasia, severe hearing loss).   [x]  Repeated questions (or disagreement) between patient and/among caregivers/family during the visit.  []  Caregiver/patient emotions/behavior/beliefs interfering with implementation of treatment plan.  []  Independent interpretation of test (EKG, Chest XRay)   []  Discussion of case with a consultant physician     RISK LEVEL   [x]  Prescription drug management (moderate)   []  Minor surgery with CV risk factors or elective major surgery (moderate)   []  Dx or Rx significantly limited by SDoH (inadequate housing, living alone, poor health care access, inappropriate diet; low literacy) (moderate)   []  Major surgery - elective with CV risk factors or emergent (high)   []  Need for hospitalization (high)   []  New DNR or de-escalation of care (high)      SDoH  The diagnosis or treatment of said conditions is significantly limited by the following social determinants of health:  []  Z59.0 Homelessness  []  Z59.1 Inadequate housing  []  Z59.2 Discord with neighbors, lodgers and landlord  []  Z60.2 Problems related to living alone  []  Z59.8 Other problems related to housing and economic circumstances  []  Z59.4 Lack of adequate food and safe drinking water  []  Z59.6 Low income  []  Z59.7 Insufficient social insurance and welfare support  []  Z59.9 Problems related to housing and economic circumstances, unspecified  []  Z75.3 Unavailability and inaccessibility of health care facilities  []  Z75.4 Unavailability and inaccessibility of other helping agencies  []  Z72.4 Inappropriate diet and eating habits  []  Z62.820 Parent-biological child conflict  []  Z63.8 Other specified problems related to primary support group  []  Z55.0 Illiteracy and low level literacy   []  Z56.9 Unspecified problems related to employment      Chartered loss adjuster:  Gillis Santa I. Quinnton Bury 03/27/2022 3:36 PM

## 2022-03-28 NOTE — Telephone Encounter
Called mobile number in chart left message to leave urine sample at the lab , there was not enough sample left at the office before she left. Per Dr. Senaida Ores, she really needs to leave a sample. To call back if any questions    Clinton Quant, Medical Assistant III  Primary Care, Pediatrics, and Immediate Care  Office: [phone]   [place], [place] [address]    [email or web address]

## 2022-04-06 ENCOUNTER — Telehealth: Payer: MEDICARE

## 2022-04-06 NOTE — Telephone Encounter
Received referral via fax. For pt to schedule consult with Dr. Michele Mcalpine.     Spoke with pt's husband. States Dr. Glennis Brink (surgeon from [facility]) is referring pt for medical onc to follow up with a medical oncologist in September.

## 2022-04-10 ENCOUNTER — Telehealth: Payer: TRICARE (CHAMPUS)

## 2022-04-10 NOTE — Telephone Encounter
Plan of care received

## 2022-04-10 NOTE — Telephone Encounter
Message to Practice/Provider      Message: Jodi Bentley from [facility] faxed an updated occupational therapy plan of care for Dr Senaida Ores to fill out and sign. Requesting for it to be sent back through fax 03/19/2022. Nothing as been received. Please assist.  F; [phone]    Return call is not being requested by the patient or caller. No     Patient or caller has been notified of the turnaround time of 1-2 business day(s).

## 2022-04-10 NOTE — Telephone Encounter
Appointment Accommodation Request      Appointment Type:New Consult     Reason for sooner request:Pt's daughter Alcus Dad requesting to reschedule pt's 07/02/22 11am consult, reschedule to Jan or Feb 2024 as this will be the pt's annual visit. CBN: [phone] Manuela Schwartz    Date/Time Requested (If any): Jan or Feb 2024, late morning or early afternoon    Last seen by MD: np    Any Symptoms:  '[]'$  Yes  '[x]'$  No       If yes, what symptoms are you experiencing:   o Duration of symptoms (how long):     Patient or caller was offered an appointment but declined.    Patient or caller was advised to seek emergency services if conditions are urgent or emergent.    Patient or caller has been notified of the turnaround time of 1-2 business (days).

## 2022-04-10 NOTE — Telephone Encounter
Please be on the look out for Plan of Care.

## 2022-04-12 NOTE — Telephone Encounter
Lm for daughter Manuela Schwartz. To return call to r/s new consult.     [facility] trans call to [place] (Lunch 1-2PM)

## 2022-04-16 NOTE — Telephone Encounter
Accepted PDL call from patients daughter to reschedule appointment to Feb 2024

## 2022-04-16 NOTE — Telephone Encounter
Good Afternoon,    PDL Call to Clinic    Reason for Call: patient's daughter returning Ana's call    Appointment Related?  '[x]'$  Yes  '[]'$  No     If yes;  Date: TBD  Time: TBD    Call warm transferred to PDL: '[x]'$  Yes  '[]'$  No    Call Received by Clinic Representative: [place]    If call not answered/not accepted, call received by Patient Services Representative:

## 2022-04-30 ENCOUNTER — Ambulatory Visit: Payer: TRICARE (CHAMPUS)

## 2022-05-03 NOTE — Telephone Encounter
FYI

## 2022-05-07 ENCOUNTER — Ambulatory Visit: Payer: MEDICARE

## 2022-05-07 DIAGNOSIS — R059 Cough, unspecified type: Secondary | ICD-10-CM

## 2022-05-18 ENCOUNTER — Telehealth: Payer: MEDICARE

## 2022-05-18 NOTE — Telephone Encounter
Called the patient back and spoke with her daughter Manuela Schwartz to let her know that the medical records were received. Dr. Silverio Decamp has confirmed.

## 2022-05-18 NOTE — Telephone Encounter
Patient's daughter Jodi Bentley called and wanted to confirm that if Jodi Bentley's medical records were received. The daughter stated that these records (discs) were delivered by [employer] on 04/02/22 and that they were signed for. I checked the chart under the media tab to see if anything was uploaded to the associated date and I did not find anything. Patient confirmed the address and said that she put Jodi Bentley for the attention. Patient's daughter said that these records needed to be reviewed before the upcoming appointment. I let Jodi know I can send a message to the doctor to try and see if these were received.

## 2022-05-18 NOTE — Telephone Encounter
PDL Call to Clinic    Reason for Call: Patient calling to verify records were received that were sent via [employer] on 06/12    Appointment Related?  '[x]'$  Yes  '[]'$  No     If yes;  Date:  Time:    Call warm transferred to PDL: '[x]'$  Yes  '[]'$  No    Call Received by Clinic Representative: [place]     If call not answered/not accepted, call received by Patient Services Representative:

## 2022-06-04 ENCOUNTER — Telehealth: Payer: MEDICARE | Attending: Neurology

## 2022-06-04 ENCOUNTER — Ambulatory Visit: Payer: MEDICARE

## 2022-06-04 DIAGNOSIS — R9089 Other abnormal findings on diagnostic imaging of central nervous system: Secondary | ICD-10-CM

## 2022-06-04 NOTE — Consults
Consults     Patient Consent to Telehealth   The patient agreed to participate in the video visit prior to joining the visit.        PATIENT:  Jodi Bentley  MRN:  [id number]  DOB:  1941/05/30  DATE OF SERVICE:  06/04/2022    REQUESTING PHYSICIAN: Linward Headland., MD  PRIMARY CARE PHYSICIAN: Linward Headland., MD    History of Present Illness:    81 y.o. female with history of breast cancer, asthma, hypertension, psoriasis, asthma, arhtritis who presents for possible NPH.  She lives at [place] - moved in 01/20/22 from [place]. Had care at [facility].  She has had memory problems- worsening after the fall in 5/21 on rivastigmine 3 mg BID - started in 3/22. Had CSF studies done - suggestive for AD at [facility].  She had fall in 02/2020 c/b mild SAH, and family noticed more memory problems since then. Was diagnosed in 2021: moderate obstructive sleep apnea with an AHI of 16.4 and severe PLMI of 174.3. ?Not using CPAP. Had a fall in 02/2020 c/b SDH, and family noticed more memory problems since then Was diagnosed with a neuroendocrine tumor in 11/22, and had surgery in 1/23. She had LP done in 2022 at [facility] and did some CSF Studies.    ?  Medical notes in [facility] of Jodi Bentley from this hospitalization were reviewed.    No past medical history on file.    No past surgical history on file.    Allergy:  Allergies   Allergen Reactions   ? Ace Inhibitors Cough     Other reaction(s): Other (See Comments)  cough  cough  cough     ? Sulfasalazine    ? Sulfa Antibiotics Rash       Medications:  Prior to Admission medications    Medication Sig Start Date End Date Taking? Authorizing Provider   cyanocobalamin 1000 MCG tablet Take 1 tablet (1,000 mcg total) by mouth daily.  Patient not taking: Reported on 03/27/2022.    PROVIDER, HISTORICAL   loratadine 10 mg tablet Take 1 tablet (10 mg total) by mouth daily. 02/18/22   Sauder, Gillis Santa I., MD   pantoprazole 40 mg DR tablet Take 1 tablet (40 mg total) by mouth daily. 02/18/22   Sauder, Gillis Santa I., MD   rivastigmine 3 mg capsule Take 1 capsule (3 mg total) by mouth two (2) times daily. 02/18/22   Channing Mutters I., MD   venlafaxine 150 mg 24 hr capsule Take 1 capsule (150 mg total) by mouth daily with breakfast. 02/15/22   Sauder, Gillis Santa I., MD   venlafaxine 37.5 mg 24 hr capsule Take 2 capsules (75 mg total) by mouth daily with breakfast With 150mg  for total 225mg  daily. 02/24/22   Linward Headland., MD       Family History:  No family history on file.    Social History:  Social History     Socioeconomic History   ? Marital status: Unknown   Tobacco Use   ? Smoking status: Former     Types: Cigarettes   ? Smokeless tobacco: Never        Review of Systems:  Fourteen system review performed with all systems negative except as documented above.       GEN: Elderly woman in no acute distress    CV: RRR, Normal S1, S2, no murmurs, rubs or gallops    NEUROLOGICAL:    MENTAL STATUS: Alert. She has intact speech, comprehension and  repeittion.    CRANIAL NERVES: Pupils equal, round, and reactive to light. Visual Fields Full to confrontation, Fundoscopic exam attempted but unable to visualize due to patient movement, Extraocular eye movements intact, Face intact to Light Touch, Pinprick; Face symmetric with good forehead wrinkle and smile excursion bilaterally; Hearing intact bilaterally; Palate elevates equally bilaterally; Sternocleidomastoid/Trapezius 5/5, equal bilaterally; Tongue Midline    MOTOR: Bulk nl, Tone nl, No pronator drift.  Finger Taps normal.  Strength appears grossly at least 3/5 throughout on video visit  ?  DEEP TENDON REFLEXES: Unable to assess due to video visit      COORDINATION: Intact to Finger to Nose    GAIT: She walks with a mild magnetic gait with difficulty with turns    SENSORY: Intact to LT throughout    ROMBERG: Deferred      Lab Review:      Recent labs:   Personally reviewed and analyzed by me for impact on the patient's problem(s)--    No results found for this or any previous visit (from the past 72 hour(s)).    Neurologic Data:  Personally reviewed and analyzed by me for impact on the patient's problem(s)--      Assessment:   81 y.o. female with history of breast cancer, asthma, hypertension, psoriasis, asthma, arhtritis who presents for possible NPH.  She lives at [place] - moved in 01/20/22 from [place]. Had care at [facility].  She has had memory problems- worsening after the fall in 5/21 on rivastigmine 3 mg BID - started in 3/22. Had CSF studies done - suggestive for AD at [facility].  She had fall in 02/2020 c/b mild SAH, and family noticed more memory problems since then. Was diagnosed in 2021: moderate obstructive sleep apnea with an AHI of 16.4 and severe PLMI of 174.3. ?Not using CPAP. Had a fall in 02/2020 c/b SDH, and family noticed more memory problems since then Was diagnosed with a neuroendocrine tumor in 11/22, and had surgery in 1/23. She had LP done in 2022 at [facility] and did some CSF Studies.      Overall, patient presents with possible NPH and possible Alzheimer's dementia.   --Spoke with patient/family about possibilty of lumbar drain trial but patient reluctant to pursue given possible co-morbid dementia        Author:  Venetia Night. Falon Huesca, MD 06/04/2022 10:23 AM

## 2022-06-05 NOTE — Telephone Encounter
Patient requesting a referral to the [place] clinic.     Ok to offer a visit to further discuss ?     LOV: 04/13/2022

## 2022-06-06 ENCOUNTER — Telehealth: Payer: MEDICARE

## 2022-06-06 ENCOUNTER — Ambulatory Visit: Payer: TRICARE (CHAMPUS) | Attending: Neurology

## 2022-06-06 NOTE — Telephone Encounter
Message to Practice/Provider      Message:   Please email new pt packet to: [email or web address].  Pt has a consult with Dr. Sondra Barges on 09/05/22 to est care   Return call is not being requested by the patient or caller.  no  Patient or caller has been notified of the turnaround time of 1-2 business day(s).

## 2022-06-07 NOTE — Telephone Encounter
Emailed [place] as requested.

## 2022-07-02 ENCOUNTER — Ambulatory Visit: Payer: MEDICARE | Attending: Gastroenterology

## 2022-07-10 ENCOUNTER — Ambulatory Visit: Payer: MEDICARE

## 2022-07-10 DIAGNOSIS — Z7689 Persons encountering health services in other specified circumstances: Secondary | ICD-10-CM

## 2022-07-10 DIAGNOSIS — Z23 Encounter for immunization: Secondary | ICD-10-CM

## 2022-07-10 NOTE — Progress Notes
[facility]   Progress Note    CC:   Chief Complaint   Patient presents with   ? Establish Care     Transfer of care        S: Jodi Bentley is a 81 y.o. female here with husband to transfer care.    May 2021 fell down a small incline on a walking trail, had contusions and abrasions on her face, brain bleed found when they went to the hospital. She was admitted for 48 hours and it dissipated. Increased confusion and memory changes thereafter. She saw a neurologist and was diagnosed with MCI.     Fall 2022 alternating diarrhea and constipation. 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 Saw surgical oncology. Surgery in Jan 2023, told ''two lymph nodes left.'' This was all in [place].     Moved here January 02, 2022, living in [facility] in [facility]    Worried about possible Alzheimer's diagnosis. Has social anxiety and depression.     Has a balance problem. Just stopped PT. Doing exercise classes.     Seeing a speech therapist now.     Past Medical History:   Diagnosis Date   ? Histoplasmosis        Review of Systems   Psychiatric/Behavioral: Positive for depression and memory loss. The patient is nervous/anxious.        Medications that the patient states to be currently taking   Medication Sig   ? chlorhexidine 0.12% solution TAKE 15 ML BY MOUTH TWICE A DAY AS DIRECTED SWISH AND SPIT OUT   ? ibuprofen 800 mg tablet TAKE 1 TABLET BY MOUTH THREE TIMES A DAY AS NEEDED FOR PAIN   ? loratadine 10 mg tablet Take 1 tablet (10 mg total) by mouth daily.   ? pantoprazole 40 mg DR tablet Take 1 tablet (40 mg total) by mouth daily.   ? rivastigmine 3 mg capsule Take 1 capsule (3 mg total) by mouth two (2) times daily.   ? venlafaxine 150 mg 24 hr capsule Take 1 capsule (150 mg total) by mouth daily with breakfast.   ? venlafaxine 37.5 mg 24 hr capsule Take 2 capsules (75 mg total) by mouth daily with breakfast With 150mg  for total 225mg  daily.       O: BP 136/77  ~ Pulse 79  ~ Temp 36.6 ?C (97.9 ?F) (Tympanic)  ~ Ht 5' 7.32'' (1.71 m)  ~ Wt 168 lb (76.2 kg)  ~ SpO2 95%  ~ BMI 26.06 kg/m?   Physical Exam  Constitutional:       General: She is not in acute distress.     Appearance: Normal appearance.   Cardiovascular:      Rate and Rhythm: Normal rate.   Pulmonary:      Effort: Pulmonary effort is normal.   Neurological:      Mental Status: She is alert.       MR brain 03/13/22  IMPRESSION:  ?  1. No acute infarct or hemorrhage.  2. Lateral ventricles are slightly dilated out of proportion to the degree of atrophy, correlate for normal pressure hydrocephalus.  3. Hemosiderin staining along the right frontal lobe and smaller amount along left frontal lobe, likely from prior hemorrhage.  4. Generalized cerebral atrophy with asymmetrically increased atrophy of the parietal lobes.    PHQ9 score unable to score, did not complete all questions  GAD7 score 12    A/P: 81 y/o  F with:    Diagnoses and all orders for this visit:    GAD (generalized anxiety disorder)  Mild major depression (HCC/RAF)  -     Referral to [facility] - geriatric psychiatry and therapy  - continue present management for now    Other malignant neuroendocrine tumors (HCC/RAF)  - heme/onc appt scheduled  - to schedule endocrinology appt    Amnestic MCI (mild cognitive impairment with memory loss)  Abnormal brain MRI  - continue present management for now  - upcoming neurology appt    Balance problem  - s/p PT  - continue exercise    OSA (obstructive sleep apnea)  - reminder to schedule sleep medicine appt    Need for influenza vaccination  -     Influenza Vaccine IM; high dose; PF (age 79 years and older)    Establishing care with new doctor, encounter for      Return for AWV.    The above plan of care, diagnosis, orders, and follow-up were discussed with the patient/caregiver. All questions related to this recommended plan of care were answered.    Janeice Robinson, MD  Family Medicine

## 2022-07-31 ENCOUNTER — Ambulatory Visit: Payer: TRICARE (CHAMPUS)

## 2022-07-31 DIAGNOSIS — R5383 Other fatigue: Secondary | ICD-10-CM

## 2022-07-31 DIAGNOSIS — Z0000 Encounter for general adult medical examination without abnormal findings: Secondary | ICD-10-CM

## 2022-07-31 DIAGNOSIS — Z131 Encounter for screening for diabetes mellitus: Secondary | ICD-10-CM

## 2022-07-31 DIAGNOSIS — Z780 Asymptomatic menopausal state: Secondary | ICD-10-CM

## 2022-07-31 DIAGNOSIS — Z13220 Encounter for screening for lipoid disorders: Secondary | ICD-10-CM

## 2022-07-31 DIAGNOSIS — Z1159 Encounter for screening for other viral diseases: Secondary | ICD-10-CM

## 2022-07-31 DIAGNOSIS — Z1231 Encounter for screening mammogram for malignant neoplasm of breast: Secondary | ICD-10-CM

## 2022-07-31 NOTE — Progress Notes
The patient is here for an annual Wellness Visit and a comprehensive review of chronic medical conditions.    Other Physicians and Providers of Care:  Patient Care Team:  Sheilah Mins, Cyndia Skeeters., MD as PCP - General (Family Medicine)    Social anxiety, depression awaiting BHA appointments  Constantly tired and sleepy, feels exhausted, takes 3-4 naps/day  OSA struggles with CPAP  Will get Shingrix at the pharmacy  Needs to update advanced directive  Upcoming heme/onc and neurology appts    Medications, Allergies, PMH, PSH, FamHx, ROS, Immunizations:  Patient Active Problem List   Diagnosis   ? History of fall   ? Amnestic MCI (mild cognitive impairment with memory loss)   ? Other malignant neuroendocrine tumors (HCC/RAF)   ? Balance problem   ? Impaired mobility   ? Severe hearing loss   ? Unspecified mood (affective) disorder (HCC/RAF)   ? GAD (generalized anxiety disorder)   ? Mild major depression (HCC/RAF)   ? OSA (obstructive sleep apnea)   ? Dysautonomia (HCC/RAF)   ? Dizziness   ? Abnormal brain MRI     Past Medical History:   Diagnosis Date   ? Histoplasmosis      No past surgical history on file.  Outpatient Medications Prior to Visit   Medication Sig Dispense Refill   ? chlorhexidine 0.12% solution TAKE 15 ML BY MOUTH TWICE A DAY AS DIRECTED SWISH AND SPIT OUT     ? ibuprofen 800 mg tablet TAKE 1 TABLET BY MOUTH THREE TIMES A DAY AS NEEDED FOR PAIN     ? loratadine 10 mg tablet Take 1 tablet (10 mg total) by mouth daily. 90 tablet 3   ? pantoprazole 40 mg DR tablet Take 1 tablet (40 mg total) by mouth daily. 90 tablet 3   ? rivastigmine 3 mg capsule Take 1 capsule (3 mg total) by mouth two (2) times daily. 180 capsule 3   ? venlafaxine 150 mg 24 hr capsule Take 1 capsule (150 mg total) by mouth daily with breakfast. 90 capsule 3   ? venlafaxine 37.5 mg 24 hr capsule Take 2 capsules (75 mg total) by mouth daily with breakfast With 150mg  for total 225mg  daily. 180 capsule 3   ? cyanocobalamin 1000 MCG tablet Take 1 tablet (1,000 mcg total) by mouth daily. (Patient not taking: Reported on 03/27/2022.)       No facility-administered medications prior to visit.       Review of opioid use: Not applicable, not on opioids  Allergies   Allergen Reactions   ? Ace Inhibitors Cough     Other reaction(s): Other (See Comments)  cough  cough  cough     ? Sulfasalazine    ? Sulfa Antibiotics Rash and Other (See Comments)        Social History     Socioeconomic History   ? Marital status: Unknown   Tobacco Use   ? Smoking status: Former     Types: Cigarettes   ? Smokeless tobacco: Never     No family history on file.  Immunization History   Administered Date(s) Administered   ? COVID-19, mRNA, (Pfizer - Purple Cap) 30 mcg/0.3 mL 08/03/2020   ? COVID-19, mRNA, tris-sucrose (Pfizer - [employer]) 30 mcg/0.3 mL 11/15/2019, 12/06/2019, 08/03/2020   ? Hepatitis A, unspecified formulation 02/14/2001   ? Influenza vaccine IM quadrivalent (Afluria Quad) (PF) SYR (74 years of age and older) 09/10/2012, 07/19/2015, 09/04/2016   ? PPD Test 10/30/2021   ?  Td(adult), Unspecified Formulation 12/11/2006   ? Tdap 03/03/2019   ? Yellow Fever 02/14/2001   ? influenza vaccine IM quadrivalent (Fluzone Quad) MDV (58 months of age and older) 07/29/2014, 07/30/2018, 07/22/2020   ? influenza vaccine IM quadrivalent high dose (Fluzone High Dose Quad) (PF) SYR (66 years of age and older) 07/11/2017, 06/30/2019, 08/05/2021, 07/10/2022   ? influenza vaccine IM trivalent high dose (Fluzone High Dose) (PF) SYR (74 years of age and older) 07/11/2017   ? influenza, unspecified formulation 07/24/2006, 09/21/2011, 09/07/2013, 07/11/2017, 08/15/2018, 06/30/2019, 07/22/2020   ? pneumococcal conjugate vaccine 13-valent (Prevnar) 02/16/2015   ? pneumococcal polysaccharide vaccine 23-valent (Pneumovax) 07/24/2005   ? zoster live vaccine (Zostavax) 12/29/2008       Health Maintenance   Topic Date Due   ? Hepatitis B Screening  Never done   ? Hepatitis C Screening  Never done   ? Shingles (Shingrix) Vaccine (1 of 2) Never done   ? Advance Directive  Never done   ? Osteoporosis Early Detection DEXA Scan  Never done   ? COVID-19 Vaccine(Tracks primary and booster doses, not sup/immunocomp) (5 - Pfizer series) 09/28/2020   ? Annual Preventive Wellness Visit  08/01/2023   ? Tdap/Td Vaccine (2 - Td or Tdap) 03/02/2029   ? Influenza Vaccine  Completed   ? Pneumococcal Vaccine  Completed       ROS: Pertinent items are noted in HPI.    Physical Examination:  BP 102/69  ~ Pulse 89  ~ Temp 36.2 ?C (97.2 ?F) (Tympanic)  ~ Wt 169 lb (76.7 kg)  ~ SpO2 96%  ~ BMI 26.22 kg/m?   No results found.     General appearance - alert, well appearing, and in no distress  Ears - bilateral TM's and external ear canals normal  Nose - normal and patent, no erythema, discharge or polyps  Mouth - mucous membranes moist, pharynx normal without lesions  Chest - clear to auscultation, no wheezes, rales or rhonchi, symmetric air entry  Heart - normal rate, regular rhythm, normal S1, S2, no murmurs, rubs, clicks or gallops  Abdomen - soft, nontender, nondistended, no masses or organomegaly  Extremities - peripheral pulses normal, no pedal edema, no clubbing or cyanosis    Evaluation of Cognitive Function:  Mood/affect: Oriented to time, place, person.   Judgement: intact       Behavioral Health Screening:  PHQ-9 Results      07/10/2022    12:30 PM 07/10/2022     2:34 PM   Depression Screening (Patient Health Questionnaire PHQ)   Feeling down, depressed, or hopeless  More than half the days   Trouble falling or staying asleep, or sleeping too much  Nearly every day   Feeling tired or having little energy  Nearly every day   Poor appetite or overeating  Not at all   Feeling bad about yourself - or that you are a failure or have let yourself or your family down  Several days   Trouble concentrating on things, such as reading the newspaper or watching television  More than half the days   Thoughts that you would be better off dead, or of hurting yourself in some way  Several days   Suicide Risk: Follow-up documented Follow-up action taken;see progress note        GAD-7 Results      07/10/2022     2:36 PM   GAD-7   Feeling nervous, anxious, or on edge More than half the days  Not being able to stop or control worrying More than half the days   Worrying too much about different things More than half the days   Trouble relaxing More than half the days   Being so restless that is hard to sit still Several days   Becoming easily annoyed or irritable More than half the days   Feeling afraid, as if something awful might happen Several days   Total Score 12       DAST Results       No data to display                Audit-C results       No data to display              Courntey [place] has no Advance Directive or POLST in [facility] and has the capacity to identify a surrogate decision maker. Next steps in advance care planning-: I gave patient an advance directive to record Health Care Agent and begin advance care planning.    Verbal or written information was provided to the patient:   []  16+ minutes for ACP  []   8+ minutes for alcohol screen   []   8+ minutes for depression screen  []   8+ minutes for CV Risk Reduction counseling       Problems and Assessment/Plan    Diagnoses and all orders for this visit:    Medicare annual wellness visit, subsequent  - pap aged out  - colonoscopy aged out - but defer to oncology if needs to continue in light of neuroendocrine tumor  - mammogram ordered per pt request  - vaccines reviewed  - STI screen today   - diet, exercise discussed     Screening for diabetes mellitus  -     Hgb A1c; Future    Screening for hyperlipidemia  -     Lipid Panel; Future    Menopause  -     DXA lumbar spine+hip; Future; Expected date: 07/31/2022    GAD (generalized anxiety disorder)  Mild major depression (HCC/RAF)  - to schedule [facility] appts with geriatric psychiatry and therapist    Amnestic MCI (mild cognitive impairment with memory loss)  - upcoming neurology appt    Other malignant neuroendocrine tumors (HCC/RAF)  - upcoming oncology appt    OSA (obstructive sleep apnea)  -     Referral for Sleep Consultation    Fatigue, unspecified type  -     Comprehensive Metabolic Panel; Future  -     TSH with reflex FT4, FT3; Future  -     CBC & Auto Differential; Future  -     Folate,Serum; Future  -     Vitamin B12; Future  -     Vitamin D,25-Hydroxy; Future    Encounter for screening mammogram for malignant neoplasm of breast  -     Mammo tomosynthesis, screening, bilat breast; Future    Advice/Referrals:   Subsequent annual wellness visit--at least 12 months since last annual wellness visit.       The additional diagnoses assessed at this visit constituted a separate, identifiable service from the routine preventive health examination.    The above diagnosis, orders, and follow-up were discussed with the patient. The patient had all questions answered satisfactorily and understands this recommended plan of care.    Janean Sark, MD  07/31/2022

## 2022-07-31 NOTE — Patient Instructions
[facility] wants you to have a voice in your healthcare. Completing an advance directive is a good way to ensure that we honor your wishes for health care. This allows you to:  ? Name a trusted person to help make medical decisions in case of an emergency  ? Tell this trusted person and your doctors what is most important in your life and for your healthcare now and in the future     You are receiving this message because you do not have an enduring advance directive (or POLST) in the [facility] medical record or your document is more than five years old. Your physician wants to ensure that you have an advance directive and it is current. Please look over and fill in an advance directive. You can access a [place] advance directive at [email or web address]    Discuss your advance directive and wishes for medical care at the next visit with your physician. Turn in your advance directive to your physician by mail or FAX, through your [facility] portal, or bring it to clinic.    Contact [email or web address] to get information on dates and times of an interactive virtual session for [facility] patients to learn more about Advance Care Planning and register for the course.   Your Preventive Wellness Plan    Preventive Service Frequency    Measurement of height, weight and determination of BMI (body mass index)    ? Annually   Blood Pressure measurement   ? Annually   Vision check   ? Every 2 years, to include a screening for glaucoma, typically starting at age 20   Breast Cancer Screening (Mammogram) ? Every 1-2 years. Screening is definitely indicated to age 49, and beyond age 52 if agreed to by the patient and the physician   Cervical Cancer Screening (Pap Smear) ? Every 5 years, aged 19-65 with HPV testing. For average risk women Pap Smears are not indicated after age 8. Pap smears are also not indicated routinely for women who have had a hysterectomy   Osteoporosis Screening (Bone Density Measurement) ? A baseline screen is recommended at age 72 and then repeat testing if risk factors exist.   Cholesterol Testing ? At least every 5 years; more frequently based on risk factors   Diabetes Screening ? At least every 5 years; more frequently based on risk factors    Colorectal Cancer Screening ? Annually, Fecal Immunochemical test; or  ? Every 10 years, Colonoscopy  (More frequent colonoscopy if high risk)   Sexually Transmitted Infections (STI?s) As necessary for those with risk factors   Depression Screening ? Annually   Alcohol Misuse Screening ? Annually   Immunizations:  ? Pneumococcal (Pneumonia) Vaccine            ? Influenza (Flu) Vaccine    ? Tdap (tetanus-pertussis-diphtheria) Vaccine  ? Zoster (Shingles) Vaccine        ? Covid vaccine and boosters        ? There are a few different Pneumococal vaccines available. Starting at age 42 patients should either receive:  o A single dose of Prevnar 20, or  o A dose of Pneumovax 23 and a dose of Prevnar 13  ? Annual Influenza vaccine    ? One adult dose of Tdap     ? Shingles vaccine - This is a 2 dose new vaccine that is recommended - even if you have had the old vaccine previously or have had Shingles previously.    ? Maintaining complete immunization  against Covid-19 following CDC guidelines

## 2022-08-05 ENCOUNTER — Ambulatory Visit: Payer: TRICARE (CHAMPUS)

## 2022-08-09 ENCOUNTER — Ambulatory Visit: Payer: TRICARE (CHAMPUS)

## 2022-08-09 DIAGNOSIS — R5381 Other malaise: Secondary | ICD-10-CM

## 2022-08-09 DIAGNOSIS — R5383 Other fatigue: Secondary | ICD-10-CM

## 2022-08-09 MED ORDER — NITROFURANTOIN MONOHYD MACRO 100 MG PO CAPS
100 mg | ORAL_CAPSULE | Freq: Two times a day (BID) | ORAL | 0 refills | Status: AC
Start: 2022-08-09 — End: ?

## 2022-08-09 NOTE — Patient Instructions
Colace and Senna pill twice a day for constipation.

## 2022-08-10 NOTE — Progress Notes
Chief Complaint   Patient presents with   ? Other     Patient woke with chills and shaking this morning, hard for her to get up, hard for her to walk,    ? ILI/PUI       SUBJECTIVE:  Jodi Bentley is a 81 y.o. female who presents for   Chief Complaint   Patient presents with   ? Other     Patient woke with chills and shaking this morning, hard for her to get up, hard for her to walk,    ? ILI/PUI     81 year old female with history of mild cognitive impairment presents for concern of significant notable new worsening symptoms of this.  In addition to this she is been having 3 days possible constipation as well with notable straining with bowel movements.  There is also associated suprapubic pressure.  Possible subjective chills this morning that has resolved at this time.  No associated myalgias or fevers.  No nausea/vomiting.  No focal abdominal pain.  There has been noticeably increased fatigue most notably with ambulation.  Unsure of cause.  Majority of history obtained from husband as well as daughter.  No cough, runny nose, or other URI symptoms.    Past Medical History:   Diagnosis Date   ? Histoplasmosis        Patient Active Problem List   Diagnosis   ? History of fall   ? Amnestic MCI (mild cognitive impairment with memory loss)   ? Other malignant neuroendocrine tumors (HCC/RAF)   ? Balance problem   ? Impaired mobility   ? Severe hearing loss   ? Unspecified mood (affective) disorder (HCC/RAF)   ? GAD (generalized anxiety disorder)   ? Mild major depression (HCC/RAF)   ? OSA (obstructive sleep apnea)   ? Dysautonomia (HCC/RAF)   ? Dizziness   ? Abnormal brain MRI         Current Outpatient Medications:   ?  chlorhexidine 0.12% solution, TAKE 15 ML BY MOUTH TWICE A DAY AS DIRECTED SWISH AND SPIT OUT, Disp: , Rfl:   ?  ibuprofen 800 mg tablet, TAKE 1 TABLET BY MOUTH THREE TIMES A DAY AS NEEDED FOR PAIN, Disp: , Rfl:   ?  loratadine 10 mg tablet, Take 1 tablet (10 mg total) by mouth daily., Disp: 90 tablet, Rfl: 3  ?  pantoprazole 40 mg DR tablet, Take 1 tablet (40 mg total) by mouth daily., Disp: 90 tablet, Rfl: 3  ?  rivastigmine 3 mg capsule, Take 1 capsule (3 mg total) by mouth two (2) times daily., Disp: 180 capsule, Rfl: 3  ?  venlafaxine 150 mg 24 hr capsule, Take 1 capsule (150 mg total) by mouth daily with breakfast., Disp: 90 capsule, Rfl: 3  ?  venlafaxine 37.5 mg 24 hr capsule, Take 2 capsules (75 mg total) by mouth daily with breakfast With 150mg  for total 225mg  daily., Disp: 180 capsule, Rfl: 3  ?  cyanocobalamin 1000 MCG tablet, Take 1 tablet (1,000 mcg total) by mouth daily. (Patient not taking: Reported on 03/27/2022.), Disp: , Rfl:   ?  nitrofurantoin, macrocrystal monohydrate, (MACROBID) 100 mg capsule, Take 1 capsule (100 mg total) by mouth two (2) times daily for 7 days., Disp: 14 capsule, Rfl: 0    OBJECTIVE:  Vitals: BP 124/71  ~ Pulse 86  ~ Temp 37.3 ?C (99.1 ?F) (Tympanic)  ~ Resp 18  ~ SpO2 96%     General: NAD, pleasant & cooperative  Head: EOMI, no facial lesions  Chest: no tachypnea, retractions or cyanosis.   CVS: no JVD, no peripheral edema.  Abdomen: soft, non-distended, (+) suprapubic tenderness to palpation, Negative CVA tenderness to percussion bilaterally.   Neuro: Normal sensation, no motor deficits, notable significant word searching, no facial droop, no focal motor weakness  Skin: No ulcers, erythema, or skin breakdown  Psych: Alert, appropriate affect    Labs Reviewed:  Results for orders placed or performed in visit on 07/31/22   Comprehensive Metabolic Panel   Result Value Ref Range    Sodium 139 135 - 146 mmol/L    Potassium 3.6 3.6 - 5.3 mmol/L    Chloride 100 96 - 106 mmol/L    Total CO2 27 20 - 30 mmol/L    Anion Gap 12 8 - 19 mmol/L    Glucose 106 (H) 65 - 99 mg/dL    Creatinine 1.61 0.96 - 1.30 mg/dL    Estimated GFR 48 See GFR Additional Information mL/min/1.37m2    GFR Additional Information See Comment      Comment: GFR >89.........Marland KitchenNormal   GFR 60 - 89...Marland KitchenMarland KitchenNormal to mildly decreased   GFR 45 - 59...Marland KitchenMarland KitchenMildly to moderately decreased   GFR 30 - 44...Marland KitchenMarland KitchenModerately to severely decreased   GFR 15 - 29...Marland KitchenMarland KitchenSeverely decreased   GFR <15.........Marland KitchenKidney failure   The 2021 CKD-EPI creatinine equation was used to calculate the estimated GFR and assumes stable creatinine concentrations.   Results are in mL/min/1.73 square meters.  The patient's eGFR MAY need to be adjusted for drug dosing.   For drug dosing, utilize eGFR or eCrCl.   If using the eGFR in very large or very small patients, then multiply the reported eGFR by the estimated BSA and divide by 1.73 m2, in order to obtain eGFR in units of mL/min.    Urea Nitrogen 11 7 - 22 mg/dL    Calcium 9.5 8.6 - 04.5 mg/dL    Total Protein 6.7 6.1 - 8.2 g/dL    Albumin 4.4 3.9 - 5.0 g/dL    Bilirubin,Total 0.3 0.1 - 1.2 mg/dL    Alkaline Phosphatase 103 37 - 113 U/L    Aspartate Aminotransferase 31 13 - 62 U/L    Alanine Aminotransferase 12 8 - 70 U/L   TSH with reflex FT4, FT3   Result Value Ref Range    TSH 5.0 (H) 0.3 - 4.7 mcIU/mL     Comment: If applicable TSH pregnancy reference intervals:   First trimester (10-[redacted] weeks gestation): 0.03- 4.0 mcIU/mL  Second trimester (14-[redacted] weeks gestation): 0.19- 4.0 mcIU/mL     Lipid Panel   Result Value Ref Range    Cholesterol 226 See Comment mg/dL     Comment: The significance of total cholesterol depends on the values of LDL, HDL, triglycerides and the clinical context. A patient-provider discussion may be considered.        Cholesterol,LDL,Calc 129 (H) <100 mg/dL     Comment: Patient is non-fasting, interpret with caution.  If LDL value falls outside of the designated range AND if  included in any of the following categories, a  patient-provider discussion is recommended.     Statin therapy is recommended for individuals:  1. with clinical atherosclerotic cardiovascular disease     irrespective of LDL levels;  2. with LDL > or = 190 mg/dL;  3. with diabetes, aged 40-75 years, with LDL between 70 and     189 mg/dL;  4. without any of the above but who have LDL  between 70 and     189 mg/dL and an estimated 16-XWRU risk of     atherosclerotic cardiovascular disease > or = 7.5%     (consider statin therapy if estimated 10-year risk > or =     5.0%) (ACC/AHA 2013 Guidelines).    Cholesterol, HDL 75 >50 mg/dL     Comment: If HDL cholesterol level falls outside of the designated  range, a patient-provider discussion is recommended    Triglycerides 110 <150 mg/dL     Comment: Patient is non-fasting, interpret with caution.  If Triglyceride level falls outside of the designated range,  a patient-provider discussion is recommended.      Non-HDL,Chol,Calc 151 (H) <130 mg/dL     Comment: If Non-HDL cholesterol level falls outside of the designated  range, a patient-provider discussion is recommended.   Hgb A1c   Result Value Ref Range    Hgb A1c - HPLC 5.4 <5.7 %     Comment: For patients with diabetes, an A1c less than (<) or equal (=) to 7.0% is recommended for most patients, however the goal may be higher or lower depending on age and/or other medical problems.   For a diagnosis of diabetes, A1c greater than (>) or equal(=) to 6.5% indicates diabetes; values between 5.7% and 6.4% may indicate an increased risk of developing diabetes.   Folate,Serum   Result Value Ref Range    Folate,Serum 19.3 8.1 - 30.4 ng/mL     Comment: Ingestion of high levels of biotin in dietary supplements may lead to falsely increased results.   Vitamin B12   Result Value Ref Range    Vitamin B12 1,216 (H) 254 - 1,060 pg/mL     Comment: Ingestion of high levels of biotin in dietary supplements may lead to falsely increased results.   Vitamin D,25-Hydroxy   Result Value Ref Range    Vitamin D,25-Hydroxy 30 20 - 50 ng/mL     Comment: Ingestion of high levels of biotin in dietary supplements may lead to falsely increased results.    Deficiency: less than 12 ng/mL                                                   Inadequate: 12-19 ng/mL Adequate: 20-50 ng/mL                                                             Potential adverse effects: greater than 50 ng/mL       CBC   Result Value Ref Range    White Blood Cell Count 11.68 (H) 4.16 - 9.95 x10E3/uL    Red Blood Cell Count 4.28 3.96 - 5.09 x10E6/uL    Hemoglobin 13.1 11.6 - 15.2 g/dL    Hematocrit 04.5 40.9 - 45.2 %    Mean Corpuscular Volume 94.2 79.3 - 98.6 fL    Mean Corpuscular Hemoglobin 30.6 26.4 - 33.4 pg    MCH Concentration 32.5 31.5 - 35.5 g/dL    Red Cell Distribution Width-SD 50.0 (H) 36.9 - 48.3 fL    Red Cell Distribution Width-CV 14.6 11.1 - 15.5 %    Platelet Count, Auto  231 143 - 398 x10E3/uL    Mean Platelet Volume 11.0 9.3 - 13.0 fL    Nucleated RBC%, automated 0.0 No Ref. Range %     Comment: Percent Reference Range Not Reported per accrediting agency    Absolute Nucleated RBC Count 0.00 0.00 - 0.00 x10E3/uL    Neutrophil Abs (Prelim) 7.76 See Absolute Neut Ct. x10E3/uL     Comment: This is a preliminary result.  If automated differential see Absolute Neut  Count or if manual differential see Absolute Neut Ct, Manual for final result.   Differential, Automated   Result Value Ref Range    Neutrophil Percent, Auto 66.4 No Ref. Range %     Comment: Percent reference range not reported per accrediting agency.      Lymphocyte Percent, Auto 20.0 No Ref. Range %     Comment: Percent reference range not reported per accrediting agency.      Monocyte Percent, Auto 6.8 No Ref. Range %     Comment: Percent reference range not reported per accrediting agency.      Eosinophil Percent, Auto 5.6 No Ref. Range %     Comment: Percent reference range not reported per accrediting agency.      Basophil Percent, Auto 0.7 No Ref. Range %     Comment: Percent reference range not reported per accrediting agency.      Immature Granulocytes% 0.5 No Reference Range %     Comment: Percent reference range not reported per accrediting agency.      Absolute Neut Count 7.76 (H) 1.80 - 6.90 x10E3/uL    Absolute Lymphocyte Count 2.34 1.30 - 3.40 x10E3/uL    Absolute Mono Count 0.79 0.20 - 0.80 x10E3/uL    Absolute Eos Count 0.65 (H) 0.00 - 0.50 x10E3/uL    Absolute Baso Count 0.08 0.00 - 0.10 x10E3/uL    Absolute Immature Gran Count 0.06 (H) 0.00 - 0.04 x10E3/uL   Free T4 reflex only   Result Value Ref Range    Free T4 1.10 0.80 - 1.70 ng/dL     Comment: If applicable free T4 pregnancy reference intervals:   First trimester (10-[redacted] weeks gestation): 0.9 - 1.4 ng/dL  Second trimester (60-[redacted] weeks gestation): 0.7 - 1.3 ng/dL     Ingestion of high levels of biotin in dietary supplements may lead to falsely increased results.   HBS Antigen   Result Value Ref Range    HBs Ag Nonreactive Nonreactive   HCV Antibody Screen with Reflex to Quantitative PCR/Genotype   Result Value Ref Range    HCV Ab Screen Nonreactive Nonreactive     ASSESSMENT/PLAN:  1. Malaise  - nitrofurantoin, macrocrystal monohydrate, (MACROBID) 100 mg capsule; Take 1 capsule (100 mg total) by mouth two (2) times daily for 7 days.  Dispense: 14 capsule; Refill: 0  - Bacterial Culture Urine, Clean Catch; Future  - Urinalysis Routine; Future    2. Suprapubic pressure  - nitrofurantoin, macrocrystal monohydrate, (MACROBID) 100 mg capsule; Take 1 capsule (100 mg total) by mouth two (2) times daily for 7 days.  Dispense: 14 capsule; Refill: 0  - Bacterial Culture Urine, Clean Catch; Future  - Urinalysis Routine; Future     New per history and consistent with symptoms.  Possible simple cystitis given worsening cognitive symptoms as well as positive exam findings.  Other differential diagnosis includes constipation.  Unlikely TOA, Ovarian Torsion, PID, gonorrhea/chlamydia. Low suspicion for Infected Urolithiasis, AAA, Cholecystitis, Pancreatitis, SBO, Appendicitis, or other acute abdomen.  Patient states she is unable to leave a urine sample at this time.    Empiric antibiotics per above. May use OTC tylenol for pain as needed.  Also advised initiating Colace and senna for possible concurring constipation.  Family will bring back clean-catch urine for further workup per orders.    Follow-up with PMD if not improving in 24-48 hours.  Return to clinic in 4 days for re-evaluation.      ER and return precautions discussed:  If showing any of these signs, seek emergency medical care immediately  - Trouble breathing  - Persistent pain or pressure in the chest  - Pain out of proportion  - New confusion  - Inability to wake or stay awake  - Pale, gray, or blue-colored skin, lips, or nail beds, depending on skin tone  This list is not all possible symptoms. Please contact your doctor for any other symptoms that are severe or concerning to you.    The above plan of care, diagnosis, orders, and follow-up were discussed with the patient/caregiver. All questions related to this recommended plan of care were answered.    Portions of this note may have been created with voice recognition software. Occasional wrong-word or ''sound-alike'' substitutions may have occurred due to the inherent limitations of voice recognition software. Please read the chart carefully and recognize, using context, where these substitutions have occurred.    Patient Instructions   Colace and Senna pill twice a day for constipation.          Delice Bison, D.O.  Primary Care Sports Medicine  [facility]  08/09/2022

## 2022-08-12 ENCOUNTER — Ambulatory Visit: Payer: MEDICARE

## 2022-08-17 NOTE — Consults
SLEEP MEDICINE CONSULTATION     PCP: Sheilah Mins, Cyndia Skeeters., MD  Referring Physician: Sheilah Mins, Cyndia Skeeters.,*    Chief Sleep Complaint:   No chief complaint on file.    DIAGNOSTIC SLEEP TEST(s):  None on file     CARDIODIAGNOSTIC EXAM(s):  None on file   History of Present Illness   Jodi Bentley is 81 y.o. female  has a past medical history of Histoplasmosis. presents with ***      ? Occupation: ***  ? Relevant Familial Medical History: No history of sleep disorders in family    ? Usual bedtime *** PM, falls asleep within *** minutes.  ? Once asleep, the patient reports sleep fragmentation *** times/night due to ***; falls back asleep ***  ? Wake-up time *** AM, out of bed time: ***, total time in bed: *** hours, estimated total sleep time: *** hours  ? Patient endorses taking *** daytime naps/week.  x*** in length  ? Patient and/or bed partner []  REPORTS  []  denies snoring  ? []  REPORTS  []  denies pauses in breathing and irregular breathing pattern during sleep  ? []  REPORTS  []  denies AM headache.    ? []  REPORTS  []  denies non-restorative sleep, or daytime fatigue, or trouble focusing and concentrating, or excessive daytime sleepiness which adversely affects work/school performance, relationships, quality of life.  ? []  REPORTS  []  denies sleepiness with driving.  States understanding to pull aside safely on side of road should patient become sleepy behind wheel.  ? []  REPORTS  []  denies motor vehicle collisions due to drowsy driving.      ? STOP BANG: ***/8: []  Snoring, []  Tired, []  Observed Apnea, []  blood Pressure, []  BMI>35, []  Age>50, []  Neck circumference, []  Gender   ? Epworth Sleepiness Scale (ESS): ***/24  ? PHQ9: ***/27 1-4 minimal; 5-9 mild; 10-14 moderate; 15-19 moderately severe; 20-27 severe    ? []  REPORTS  []  denies nicotine use                              ? []  REPORTS  []  denies alcohol use                             ? []  REPORTS  []  denies recreational drug use                       ? []  REPORTS  []  denies caffeine use      ? []  REPORTS  []  denies supplements                                           ? []  REPORTS  []  denies sedative/hypnotic medicines                   ? []  REPORTS  []  denies stimulant medicines             ? []  REPORTS  []  denies the regular urge to move the legs or irritating feeling in the legs that prevent patient from falling asleep.              ? []  REPORTS  []  denies regular nightmares.                       ? []   REPORTS  []  denies current symptoms suggestive of sleep paralysis.                ? []  REPORTS  []  denies current hypnagogic or hypnopompic hallucinations.                   ? []  REPORTS  []  denies history of abnormal behaviors during sleep.                                 Physical Exam   There were no vitals taken for this visit. There is no height or weight on file to calculate BMI.  General: Well-developed female, in no acute distress.   HEENT: Normocephalic and atraumatic. Extraocular movements intact.  Eyes with no exudates or lesions. Bilateral pinna without lesions.  Neck: Supple.   Skin: No facial lesions  Extremities: No clubbing or cyanosis.   Neuro: Appropriately interactive. No focal deficits. Speech fluent.   Psych: Alert and oriented x 3     SLEEP EXAM  NECK SIZE: *** inches  HEENT: Modified Mallampati Class Type: ***  Tonsils Grade: ***  Labs     Lab Results   Component Value Date    HGB 13.1 07/31/2022        Results for orders placed or performed in visit on 07/31/22   Comprehensive Metabolic Panel   Result Value Ref Range    Sodium 139 135 - 146 mmol/L    Potassium 3.6 3.6 - 5.3 mmol/L    Chloride 100 96 - 106 mmol/L    Total CO2 27 20 - 30 mmol/L    Anion Gap 12 8 - 19 mmol/L    Glucose 106 (H) 65 - 99 mg/dL    Creatinine 4.69 6.29 - 1.30 mg/dL    Estimated GFR 48 See GFR Additional Information mL/min/1.39m2    GFR Additional Information See Comment     Urea Nitrogen 11 7 - 22 mg/dL    Calcium 9.5 8.6 - 52.8 mg/dL    Total Protein 6.7 6.1 - 8.2 g/dL    Albumin 4.4 3.9 - 5.0 g/dL    Bilirubin,Total 0.3 0.1 - 1.2 mg/dL    Alkaline Phosphatase 103 37 - 113 U/L    Aspartate Aminotransferase 31 13 - 62 U/L    Alanine Aminotransferase 12 8 - 70 U/L      Lab Results   Component Value Date    TSH 5.0 (H) 07/31/2022      Lab Results   Component Value Date    HGBA1C 5.4 07/31/2022    No results found for: ''FERRITIN''    Assessment   81 y.o. female  has a past medical history of Histoplasmosis. with ***, suspected to have sleep disordered breathing.  Plan     Suspected sleep disordered breathing (SDB): (Undiagnosed)  ? Patient with  []  low []  moderate []  high pretest probability: STOP BANG ***/8  ? Proceed with []  attended polysomnogram (PSG) []  home sleep apnea test (HSAT).   ? We discussed how the HST is not a sensitive test and hence usually for a negative result, an in-lab PSG is recommended to truly rule out SDB.  ? We discussed the pathophysiology of obstructive sleep apnea (OSA). We discussed health risks associated with untreated obstructive sleep apnea including hypertension, refractory hypertension, coronary artery disease, heart failure, cardiac dysrhythmia, stroke, insulin resistance, dementia, as well as the potential to exacerbate underlying  mood disorders.  We discussed how sleep disordered breathing can affect sleep quality.  ? We reviewed treatment options for obstructive sleep apnea including: weight loss, positional therapy (elevate head of bed, maintain sleep in lateral positions), oral appliance therapy, upper airway cranial nerve XII (hypoglossal) stimulator (Inspire device) treatment, and positive airway pressure (PAP) therapy.  ? If sleep study expresses AHI between 5 and 14, treatment warranted based on patient's comorbidities:  []  excessive daytime sleepiness, []  impaired cognition, []  mood disorder, []  insomnia, []  hypertension, []  ischemic heart disease, []  history of stroke.    ? Patient was recommended to try sleeping on side, elevating head and avoiding alcohol at night to improve severity of SDB.  In addition, we discussed that losing 10% of body weight may improve severity of SDB (if BMI>25). Patient was also advised not to operate heavy machinery while sleepy.  ? Patient given handout outlining timeline and next steps in current sleep evaluation.  Sources of contact provided on handout.      SYMPTOM DOCUMENTATION IN SUPPORT OF SLEEP APNEA DIAGNOSIS:  Evidence of Excessive Daytime Sleepiness  []  Disturbed or restless sleep  []  Non-restorative sleep  []  Frequent unexplained arousals from sleep  []  Fragmented sleep  []  ESS greater than or equal to 10  []  Fatigue  Evidence Suggestive of Sleep Disordered Breathing  []  Habitual loud snoring  []  Witnessed apneas during sleep  []  Choking or gasping during sleep  []  BMI greater than or equal to 30  []  Neck circumference greater than 17 inches (men) or greater than16 inches (women)  []  Sleep related bruxism  []  Cognitive deficits such as inattention or memory  []  Unexplained nocturnal reflux  []  Erectile dysfunction  []  Apneas or hypoxemia during procedures requiring anesthesia  []  Morning headaches    I discussed the plan in detail with the patient who voiced understanding and is in agreement with recommended plan of care. All questions were answered satisfactorily.    FOLLOW UP:  2-3 weeks after sleep study, or earlier if needed    Thank you for the consult.    The cumulative amount of time spent in care of the patient on 09/17/2022 was *** minutes.   *** minutes of face-to-face time was spent with the patient.  Time was spent today discussing the evaluation and management of the above diagnoses with the patient and reviewing available records with the patient. In addition, documentation of clinical information in the EHR or other records was completed along with the following if checked:    []  Review of prior sleep studies (listed above)  []  Review of prior labs, procedures, tests (listed above)  []  Review of prior/external notes                                   []  Review of new data: PAP compliance data (listed above)  []  Documenting clinical information in the EHR  []  Performing a medically appropriate examination and evaluation   []  Ordering medications, laboratory, diagnostic tests or procedures       No orders of the defined types were placed in this encounter.    []  Care Coordination: Calling DME   []  Independently interpreting tests not separately reported  []  Communication with an independent historian  []  Discussion of care with other healthcare professionals in external department    PROBLEM  []  Self-limited/minor: []  1   []  2  []  Chronic  stable  []  1   []  2  []  Acute, uncomplicated illness or injury []  1   []  2     []  Chronic unstable  []  1   []  2  []  Undiagnosed/uncertain prognosis []  1   []  2  []  Acute systemic illness/acute complicated injury  []  1   []  2  []  Chronic severe exacerbation  []  1   []  2  []  Acute or chronic causing threat to life/bodily function  []  1   []  2    REVIEW OF DATA  Category 1:   []  Review of prior sleep studies (listed above) []  1   []  2   []  3+   []  Review of prior labs, procedures, tests (listed above) []  1   []  2   []  3+     []  Review of prior/external notes                                             []  Review of new data: PAP compliance data     []  Ordering laboratory, diagnostic tests or procedures []  1   []  2   []  3+       No orders of the defined types were placed in this encounter.    []  Communication with an independent historian ([id number], [phone])  Category 2:   []  Communication with an independent historian ([id number], [id number])  []  Independent interpretation of test not separately reported ([id number], [id number], [id number], [phone])  Category 3:   []  Discussion of care with physician in external department [phone], [phone], [phone], [phone])    RISK OF COMPLICATION  []  Minimal     []  Low   []  Moderate   []  High    SOCIAL DETERMINANTS OF HEALTH  []  The diagnosis or treatment of said conditions is significantly limited by social determinants of health    ELECTRONIC PHYSICIAN SIGNATURE:  Lucia Gaskins, MD  Assistant Clinical Professor  Sleep Medicine  08/16/2022 5:21 PM

## 2022-08-30 MED ORDER — VENLAFAXINE HCL ER 37.5 MG PO CP24
75 mg | ORAL_CAPSULE | Freq: Every day | ORAL | 3 refills
Start: 2022-08-30 — End: ?

## 2022-08-30 MED ORDER — PANTOPRAZOLE SODIUM 40 MG PO TBEC
40 mg | ORAL_TABLET | Freq: Every day | ORAL | 3 refills
Start: 2022-08-30 — End: ?

## 2022-09-02 MED ORDER — PANTOPRAZOLE SODIUM 40 MG PO TBEC
40 mg | ORAL_TABLET | Freq: Every day | ORAL | 1 refills | Status: AC
Start: 2022-09-02 — End: ?

## 2022-09-02 MED ORDER — VENLAFAXINE HCL ER 37.5 MG PO CP24
75 mg | ORAL_CAPSULE | Freq: Every day | ORAL | 2 refills | Status: AC
Start: 2022-09-02 — End: 2022-09-07

## 2022-09-05 ENCOUNTER — Ambulatory Visit: Payer: TRICARE (CHAMPUS) | Attending: Student in an Organized Health Care Education/Training Program

## 2022-09-05 DIAGNOSIS — R296 Repeated falls: Secondary | ICD-10-CM

## 2022-09-05 DIAGNOSIS — F419 Anxiety disorder, unspecified: Secondary | ICD-10-CM

## 2022-09-05 DIAGNOSIS — G4733 Obstructive sleep apnea (adult) (pediatric): Secondary | ICD-10-CM

## 2022-09-05 DIAGNOSIS — G3184 Mild cognitive impairment, so stated: Secondary | ICD-10-CM

## 2022-09-05 DIAGNOSIS — R5383 Other fatigue: Secondary | ICD-10-CM

## 2022-09-05 DIAGNOSIS — R2689 Other abnormalities of gait and mobility: Secondary | ICD-10-CM

## 2022-09-05 DIAGNOSIS — K5900 Constipation, unspecified: Secondary | ICD-10-CM

## 2022-09-05 MED ORDER — SENNOSIDES 8.6 MG PO TABS
1 | ORAL_TABLET | Freq: Every evening | ORAL | 1 refills | Status: AC | PRN
Start: 2022-09-05 — End: 2022-09-06

## 2022-09-05 MED ORDER — SENNOSIDES 8.6 MG PO TABS
2 | ORAL_TABLET | Freq: Every day | ORAL | 2 refills | Status: AC
Start: 2022-09-05 — End: ?

## 2022-09-05 NOTE — Patient Instructions
Dear Deno Lunger,    Welcome to the [facility]. I am doing a fellowship in Geriatric Medicine.  I have already completed my three year residency training in internal medicine/family practice and I am licensed to practice general medicine independently. However, I have opted to seek out additional training in the care of older adults through this fellowship. As a member of the Careers adviser, I will be your primary care physician. You also will be under the care of one of the [facility] Geriatric Teaching Faculty who supervise the fellows. My clinic is every Wednesday morning. You can communicate with me via ''MyChart'', the online patient portal or by calling the clinic at [phone]. My patient service representative is Optometrist who also can assist you with administrative needs.     If you need to be seen in the evening or on the weekends we have an urgent care center in [facility] You can also call the Geriatrician on Call at anytime at [phone].    [facility]  [address]  [place], [place] [address]  [phone] or [phone]   Monday-Friday: 8am - 9pm  Weekends & [place]: 9am - 5pm     Stay Healthy,    Jonette Mate, MD    [facility] Geriatric Teaching Faculty  Wynelle Fanny, MD  Kathee Polite, MD  Vernard Gambles, MD  Nelida Gores, MD  Royetta Car, MD  Jearld Adjutant, MD  Loma Sender, MD    Ask psychiatrist if Adrean can trial Buspirone as an add-on to help treat anxiety.

## 2022-09-05 NOTE — Progress Notes
Geriatric Outpatient H&P   Patient: Jodi Bentley  MRN: [id number]  Date of Service: 09/05/2022  PCP: Sheilah Mins, Cyndia Skeeters., MD    Chief Complaint:     Chief Complaint   Patient presents with   ? Health Maintenance   ? Medication Reconciliation       HPI:   Jodi Bentley is a 81 y.o. female accompanied today by daughter, [name], and is here to establish care with a new geriatrician.    Referred to geriatrics due to age and comorbidities.    #Memory loss  #MCI  #Possible NPH  -diagnosed with MCI, with 2 MRI that were suggestive of Alzheimers  -CSF fluid pointed to Alzheimers  -Word-finding difficulties  -MOCA done in 12/2020 found in outside media tab, score 20/30. Per neurology note, pt diagnosed with MCI   -has been on rivastigmine BID, which was started by physician in [place]. Per [name], not a very noticeable difference in cognition/memory since starting it.  -Last MRI in 02/2022 showed possible NPH; per chart review, this was discussed with Dr. Anselm Jungling (neuro), pt and family elected to defer drain at the time. Per [name], NPH was deemed less likely by previous providers after review of prior MRIs/clinical assessment.     #Fall history  -had a fall 02/2020 with SDH  -Has a cane but uses it 10% of the time  -Recent falls: was getting up off sofa, kept feet close together, caught herself, fell on table and hit her head   -At least 3 falls since moving here  -At [facility], 2-bedroom, flat one story, low rugs and attached  -was getting PT at facility as well as speech therapy (was ordered in 01/2022 by Dr. Okey Dupre)  -cane has just one leg, thinks it is adjustable    #Anxiety  -reports anxiety that is interfering in her life  -BP increases w anxiety  -Lately has been crippling, interfering in her ability to socialize and take part in activities  -hydroxyzine helps, but was told by prior PCP to be careful with this due to anticholinergic in setting of possible Alzheimers  -seeing Dr. Areta Haber in [facility] tomorrow for psych appt  -taking venlafaxine; total dose 225mg     #Fatigue  -reports napping through the day, also interferes with her ability to participate in activities  -Workup previously completed, TSH mildly elevated at 5, normal T4, Hgb normal, no other laboratory abnormalities that would explain fatigue    #OSA  -has not been using CPAP due to discomfort with mask  -has sleep med appt on 11/27      Past Medical History:     Past Medical History:   Diagnosis Date   ? Histoplasmosis        ROS:   [x]  A review of 14 systems was obtained, all pertinent positives per HPI, all other systems negative  []  A review of 14 systems was unable to obtained reliably given patient's underlying cognitive impairement     GERIATRIC REVIEW OF SYSTEMS  Vision:    [x]   glasses []  legally blind []  glaucoma []  macular degeneration (glasses for near and far away)  Hearing: [x]  hearing aids []  No hearing aides []  Has difficulty hearing  Nutrition: [x]  normal   []  impaired  []  weight loss - snacks during the day but not always hungry  Swallowing: []  impaired [x]  normal    Dentures:   []  Yes [x]  No  Depression:  [x]  yes []  No   Cognition: [x]  MCI []  Dementia [  x] Subjective memory complaints  []  Denies cognitive complaints  Constipation: [x]  Yes []  No  (every 3 days if taking miralax)  Imbalance or Gait Difficulties: [x]  Yes []  No  Falls: [x]  Yes []  No    Surgical History:   No past surgical history on file.    Family History:   No family history on file.    Social History:     Social History     Socioeconomic History   ? Marital status: Unknown   Tobacco Use   ? Smoking status: Former     Types: Cigarettes   ? 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  [x]  never []  Once []  More then once    Social Determinants of Health  The diagnosis or treatment of said conditions is significantly limited by the following social determinants of health: N/A    Activities of Daily Living:     Task No Help Needed Help Needed Who Helps   Myrtis Ser ADLs:      Feeding Yourself [x]  []     Transferring   (Getting from bed to chair) [x]  []     Getting to the Toilet [x]  []     Incontinence [x]  []     Getting Dressed [x]  []     Bathing or Showering [x]  []     [place] iADLs      Using the Telephone [x]  []  sometimes confusing remote w the phone   Taking your Medications []  [x]  husband   Preparing Meals []  [x]  husband   Managing Money   (keeping track of expenses, paying bills, taxes, going to the bank, etc.) []  [x]  daughter and husband   Moderately strenuous housework  []  [x]     Doing Laundry []  [x]     Shopping for personal items or groceries []  [x]     Driving []  [x]     Getting to places beyond walking distance (e.g. by bus, taxi, or car) []  [x]  Needs help   Mobility       Walking across the room (includes using a cane or walker) [x]  []     Climbing a flight of stairs [x]  []  Uses rail        Fall Screen:     Fall in the past year?  [x]   yes  []  no Comments:as described above   Fear of falling?   [x]   yes  []  no  Comments:    Use of assistive device?  [x]   yes  []  no Comments: cane, barely uses       Allergies:     Allergies   Allergen Reactions   ? Ace Inhibitors Cough     Other reaction(s): Other (See Comments)  cough  cough  cough     ? Sulfasalazine    ? Sulfa Antibiotics Rash and Other (See Comments)        Medications:     Medications that the patient states to be currently taking   Medication Sig   ? loratadine 10 mg tablet Take 1 tablet (10 mg total) by mouth daily.   ? pantoprazole 40 mg DR  tablet Take 1 tablet (40 mg total) by mouth daily.   ? rivastigmine 3 mg capsule Take 1 capsule (3 mg total) by mouth two (2) times daily.   ? venlafaxine 150 mg 24 hr capsule Take 1 capsule (150 mg total) by mouth daily with breakfast.   ? venlafaxine 37.5 mg 24 hr capsule Take 2 capsules (75 mg total) by mouth daily with breakfast With 150mg  for total 225mg  daily.       Objective:   Vital Signs:  BP 104/70 Comment: Standing BP ~ Pulse 85  ~ Temp 36.7 ?C (98 ?F) (Forehead)  ~ Wt 166 lb 8 oz (75.5 kg)  ~ SpO2 99%  ~ BMI 25.83 kg/m?   Wt Readings from Last 10 Encounters:   09/05/22 166 lb 8 oz (75.5 kg)   07/31/22 169 lb (76.7 kg)   07/10/22 168 lb (76.2 kg)   03/27/22 170 lb (77.1 kg)   02/06/22 167 lb (75.8 kg)       Physical Exam:  Gen: Well-appearing, pleasant  HEENT: Hearing aids in place, some difficulty hearing during interview.  Cardiac: RRR, no murmurs  Pulm; CTAB  Neuro: Cranial nerves grossly normal. Strength 5/5 in BUE and BLE. Sensation fully in tact.   MSK: No tremors or rigidity.  Gait: No mobility aide, required slow turns with multiple steps. Overall steady, equal pacing.    Clinical Frailty Score:     Patient's score: 3          Labs/Studies:     Lab Results   Component Value Date    WBC 11.68 (H) 07/31/2022    HGB 13.1 07/31/2022    HCT 40.3 07/31/2022    MCV 94.2 07/31/2022    PLT 231 07/31/2022     Lab Results   Component Value Date    CREAT 1.14 07/31/2022    BUN 11 07/31/2022    NA 139 07/31/2022    K 3.6 07/31/2022    CL 100 07/31/2022    CO2 27 07/31/2022     Lab Results   Component Value Date    HGBA1C 5.4 07/31/2022     Lab Results   Component Value Date    ALT 12 07/31/2022    AST 31 07/31/2022    ALKPHOS 103 07/31/2022    BILITOT 0.3 07/31/2022   .  Lab Results   Component Value Date    TSH 5.0 (H) 07/31/2022    VITAMINB12 1,216 (H) 07/31/2022    FOLATE 19.3 07/31/2022     Lab Results   Component Value Date    CALCIUM 9.5 07/31/2022     Lab Results   Component Value Date    CHOL 226 07/31/2022    CHOLHDL 75 07/31/2022    CHOLDLCAL 129 (H) 07/31/2022    TRIGLY 110 07/31/2022       No results found.    Impression & Plan:   Alexiya Franqui is a 81 y.o. female with the following:    [x]  I have reviewed her medical records that were available to me today. They are summarized above and below in the plan of care    1. MCI (mild cognitive impairment)  #Possible NPH  -Most recent MOCA on record 20/30 in 2022; per neuro notes, dx with most likely MCI at the time. Per [name], previous providers gave most likely dx of AD based on prior MRIs and CSF analysis   -Per recent MRI results and [facility] neuro notes, possibly NPH (cognitive impairment and  gait abnormality also support this). Patient and daughter will revisit at next neuro visit the option to complete LP and assess for improvement in symptoms.  -Will complete updated MOCA at next visit   - Continue rivastigmine; will consider adding memantine at next visit after patient's psychiatric concerns (mainly anxiety) are addressed with psych    2. Constipation, unspecified constipation type  - senna 8.6 mg tablet; Take 2 tablets by mouth daily.  Dispense: 90 tablet; Refill: 2  -Given continued infrequency of bowel movements, will trial scheduled senna. Instructed pt and daughter to reduce use when BMs become more normal, but will start with 17.2 mg every day.     3. Recurrent falls  - Referral to Rehabilitation, Physical Therapy  4. Imbalance  - Referral to Rehabilitation, Physical Therapy  -Encouraged patient to use cane as much as possible to reduce falls. Will also send new PT referral (external, hoping to re-establish with PT contracted at living facility) to work on fall reduction and to coach patient in cane use for improved mobility.  -Orthostatics negative, unlikely that this is contributing to falls.    5. Fatigue, unspecified type  -Fatigue is likely due to combination between anxiety, depression, and untreated OSA.   -Will follow up with psychiatry regarding depression/anxiety, and will continue venlafaxine 225mg .  -Has followup appt with sleep med on 11/27 to further discuss OSA management.    6. Anxiety  Continue venlafaxine, ok to use hydroxyzine PRN for now given uncontrolled anxiety. Will discuss treatment plan further with psychiatry tomorrow at [facility] appt. Will discuss possibly adding buspar to augment SSRI, also interested in discussing TMS.    7. OSA (obstructive sleep apnea)  Discussed likely contribution/mechanism of untreated OSA to fatigue. Will discuss with sleep medicine at upcoming appt.      Followup: Return in about 1 month (around 10/05/2022) for Follow up .  Future Appointments   Date Time Provider Department Center   09/17/2022  3:00 PM Corena Herter., MD SLEEP [facility]   10/02/2022  2:30 PM Janean Sark., MD CPN [facility] [place]   10/10/2022  3:50 PM EIC MAM01 MAM EIC [facility]   10/17/2022 10:30 AM Jonette Mate, MD [facility] 365 /Cen   11/16/2022 10:40 AM Myriam Forehand., MD NEU TO [place]/   11/28/2022 11:00 AM Sarina Ser., MD HEM/ONC [facility]   05/02/2023  2:30 PM WLVWIC DXA01 DXA [facility] [place]/       The above plan of care, diagnosis, orders, and follow-up were discussed with the patient and/or their caregiver.  Questions related to this recommended plan of care were answered.    Discussed with Attending Dr. Natasha Mead    If Billing Based on Time:     75 minutes were spent personally by me today on this encounter which include today's pre-visit review of the chart, obtaining appropriate history, performing an evaluation, documentation and discussion of management with details supported within the note for today's visit. The time documented was exclusive of any time spent on the separately billed procedure.    []  I spent an additional 15-minute-increment(s) for a total of ___ minutes on these tasks on the day of service. [phone] for each additional 15 minutes.)    Signed: Jonette Mate, MD 09/05/2022 9:32 PM

## 2022-09-06 ENCOUNTER — Ambulatory Visit: Payer: MEDICARE

## 2022-09-06 ENCOUNTER — Ambulatory Visit: Payer: TRICARE (CHAMPUS)

## 2022-09-07 DIAGNOSIS — E038 Other specified hypothyroidism: Secondary | ICD-10-CM

## 2022-09-07 DIAGNOSIS — R7989 Other specified abnormal findings of blood chemistry: Secondary | ICD-10-CM

## 2022-09-10 NOTE — Addendum Note
Addended by: Evette Georges on: 09/09/2022 07:00 PM     Modules accepted: Orders

## 2022-09-17 ENCOUNTER — Ambulatory Visit: Payer: TRICARE (CHAMPUS)

## 2022-09-17 DIAGNOSIS — G4733 Obstructive sleep apnea (adult) (pediatric): Secondary | ICD-10-CM

## 2022-09-17 DIAGNOSIS — G4761 Periodic limb movement disorder: Secondary | ICD-10-CM

## 2022-09-17 NOTE — Patient Instructions
To make the most of your appointment, please check in 15 minutes BEFORE your appointment time.  Checking in early will allow more of your appointment time to be used to address your concerns.    1)  Please call your MEDICAL insurance (NOT dental) to see if they cover Mandibular Advancement Devices for treatment of Obstructive Sleep Apnea.  If they do, then please find a dentist who will take your medical insurance. Below is a list for reference:    PLEASE BE AWARE THAT SOME INSURANCES REQUIRE AN AUTHORIZATION FOR THE MOUTHGUARD. THIS AUTHORIZATION WILL NEED TO BE OBTAINED BY THE PERSON PROVIDING THE SERVICE, WHICH IS YOUR SELECTED DENTIST.    Dentists with expertise in Oral Appliances for OSA  Mandibular Advancement Device (MAD) - CPT code [id number] (prefabricated) or [phone] (custom fabricated)  *Patients are encouraged to call the Dentist?s office to ask about insurance coverage and cost*  *Please note: if your insurance requires authorization for the mouthpiece, please ask your selected dentist*    NAME ADDRESS CONTACT INSURANCE   Tilda Burrow, DMD, MPH  [facility]  A Floor, [facility]  [address] [place], [place] [address]  Wed 7am - 4pm  Thurs 7am - 2:30pm Phone: [phone]  Fax: [phone]  For appts: [phone] All insurance including MediCare; have patient check for OOP costs   Noel Gerold, DDS [address]  [place], [place] [address] Phone: [phone]  Fax: [phone] Insurance: [employer] (no Medicare or PPO)   Nelta Numbers, DDS [address]  [place], [place] [address] Phone: [phone]  Fax: [phone]   Insurance: All PPO (expect to be out of network)   Quenton Fetter, DDS [address]  [place], [place] [address] Phone:[phone]  Fax:[phone] Insurance: All PPO (expect to be out of network): No MediCare   Duard Larsen, DDS, MD [facility]  [address], [address]  [place], [place] [address] [email or web address]  Phone: [phone]  Fax: [phone] Insurance: PPO in network, No Medicare       [place] Trained OFP/Sleep Medicine Dentists in the [place] Area    NAME ADDRESS CONTACT   Currie Paris, DDS [address]   [address]  [place], [place] [address]   office [phone]  [email or web address]   Percell Boston [address] [place],  [address] [place], [address] [phone]   Dimas Alexandria [address] [place],  [address] [place], [address] [phone]   [facility]  [address] [place], [place] [address] [phone]   Myles Gip [place], [place] [phone]   Sabino Gasser [place], [place] [phone] [place], [place] [phone] [place], [place] [phone]   Rich Herschinger [address]  [place] [address] [phone]   Laverta Baltimore, DDS, MPH [address] [facility] [address] [place], [place] [address] [phone]     Others  NAME ADDRESS CONTACT   Thornton Park, DMD  Cherlyn Labella. Gae Dry, DDS [address]  [place], [place] [address] [phone]   Jearld Pies DDS [address]  [place], [place] [address] [phone]     Bertram Gala DDS [address]  [place], [place] [address] [phone]     2) If your insurance does not cover  mouthpieces then please select from the websites below to purchase your mouthpiece online:  Additional resources for oral appliances (mandibular advancement device or tongue advancement device):  www.snorerx.com (accepts most insurance) Usually has deal for $99/Older model: $59  Smaller sizes: http://www.snoremeds.com ($46.95) -has only one level, http://www.vitalsleep.com ($69.95) - has several levels  http://www.apnearx.com $149  https://www.americansleepdentistry.com/  https://somnomed.com/en/patients/  https://prosomnus.com/patient/    Nasal device:   Consider Bongo Rx - [phone]  Here is more information on Bongo: https://airavant.com/    Mouth tape:  https://somnifix.com/products/mouth-strips-snoring-sleep-aids    3) Please return to clinic about a month after using your MAD to evaluate for efficacy of your oral device in treating sleep apnea.    4) If of interest, you may also read more about sleep on http://sleepeducation.org    5) Please do not operate any heavy machinery when sleepy. If driving, pull over to a safe area on the road and nap.

## 2022-09-20 ENCOUNTER — Telehealth: Payer: MEDICARE | Attending: Neurology

## 2022-09-20 ENCOUNTER — Ambulatory Visit: Payer: TRICARE (CHAMPUS)

## 2022-09-20 DIAGNOSIS — R9089 Other abnormal findings on diagnostic imaging of central nervous system: Secondary | ICD-10-CM

## 2022-09-20 DIAGNOSIS — G3184 Mild cognitive impairment, so stated: Secondary | ICD-10-CM

## 2022-09-20 DIAGNOSIS — R2689 Other abnormalities of gait and mobility: Secondary | ICD-10-CM

## 2022-09-20 NOTE — Consults
Consults       Patient Consent to Telehealth   The patient agreed to participate in the video visit prior to joining the visit.        PATIENT:  Jodi Bentley  MRN:  [id number]  DOB:  11-28-1940  DATE OF SERVICE:  09/20/2022    REQUESTING PHYSICIAN: Linward Headland., MD  PRIMARY CARE PHYSICIAN: Sheilah Mins, Cyndia Skeeters., MD    History of Present Illness:    81 y.o. female with history of breast cancer, asthma, hypertension, psoriasis, asthma, arhtritis who presents for possible NPH.  She lives at [place] - moved in 01/20/22 from [place]. Had care at [facility].  She has had memory problems- worsening after the fall in 5/21 on rivastigmine 3 mg BID - started in 3/22. Had CSF studies done - suggestive for AD at [facility].  She had fall in 02/2020 c/b mild SAH, and family noticed more memory problems since then. Was diagnosed in 2021: moderate obstructive sleep apnea with an AHI of 16.4 and severe PLMI of 174.3. ?Not using CPAP. Had a fall in 02/2020 c/b SDH, and family noticed more memory problems since then Was diagnosed with a neuroendocrine tumor in 11/22, and had surgery in 1/23. She had LP done in 2022 at [facility] and did some CSF Studies.    ?  Medical notes in [facility] from this hospitalization were reviewed.    Past Medical History:   Diagnosis Date   ? Histoplasmosis        No past surgical history on file.    Allergy:  Allergies   Allergen Reactions   ? Ace Inhibitors Cough     Other reaction(s): Other (See Comments)  cough  cough  cough     ? Sulfasalazine    ? Sulfa Antibiotics Rash and Other (See Comments)       Medications:  Prior to Admission medications    Medication Sig Start Date End Date Taking? Authorizing Provider   cyanocobalamin 1000 MCG tablet Take 1 tablet (1,000 mcg total) by mouth daily.  Patient not taking: Reported on 03/27/2022.    PROVIDER, HISTORICAL   loratadine 10 mg tablet Take 1 tablet (10 mg total) by mouth daily. 02/18/22   Sauder, Gillis Santa I., MD   pantoprazole 40 mg DR tablet Take 1 tablet (40 mg total) by mouth daily. 02/18/22   Sauder, Gillis Santa I., MD   rivastigmine 3 mg capsule Take 1 capsule (3 mg total) by mouth two (2) times daily. 02/18/22   Channing Mutters I., MD   venlafaxine 150 mg 24 hr capsule Take 1 capsule (150 mg total) by mouth daily with breakfast. 02/15/22   Sauder, Gillis Santa I., MD   venlafaxine 37.5 mg 24 hr capsule Take 2 capsules (75 mg total) by mouth daily with breakfast With 150mg  for total 225mg  daily. 02/24/22   Linward Headland., MD       Family History:  No family history on file.    Social History:  Social History     Socioeconomic History   ? Marital status: Unknown   Tobacco Use   ? Smoking status: Former     Types: Cigarettes   ? Smokeless tobacco: Never        Review of Systems:  Fourteen system review performed with all systems negative except as documented above.       GEN: Elderly woman in no acute distress    CV: RRR, Normal S1, S2, no murmurs, rubs or gallops  NEUROLOGICAL:    MENTAL STATUS: Alert. She has intact speech, comprehension and repeittion.    CRANIAL NERVES: Pupils equal, round, and reactive to light. Visual Fields Full to confrontation, Fundoscopic exam attempted but unable to visualize due to patient movement, Extraocular eye movements intact, Face intact to Light Touch, Pinprick; Face symmetric with good forehead wrinkle and smile excursion bilaterally; Hearing intact bilaterally; Palate elevates equally bilaterally; Sternocleidomastoid/Trapezius 5/5, equal bilaterally; Tongue Midline    MOTOR: Bulk nl, Tone nl, No pronator drift.  Finger Taps normal.  Strength appears grossly at least 3/5 throughout on video visit  ?  DEEP TENDON REFLEXES: Unable to assess due to video visit      COORDINATION: Intact to Finger to Nose    GAIT: She walks with a mild magnetic gait with difficulty with turns    SENSORY: Intact to LT throughout    ROMBERG: Deferred      Lab Review:      Recent labs:   Personally reviewed and analyzed by me for impact on the patient's problem(s)--    No results found for this or any previous visit (from the past 72 hour(s)).    Neurologic Data:  Personally reviewed and analyzed by me for impact on the patient's problem(s)--      Assessment:   81 y.o. female with history of breast cancer, asthma, hypertension, psoriasis, asthma, arhtritis who presents for possible NPH.  She lives at [place] - moved in 01/20/22 from [place]. Had care at [facility].  She has had memory problems- worsening after the fall in 5/21 on rivastigmine 3 mg BID - started in 3/22. Had CSF studies done - suggestive for AD at [facility].  She had fall in 02/2020 c/b mild SAH, and family noticed more memory problems since then. Was diagnosed in 2021: moderate obstructive sleep apnea with an AHI of 16.4 and severe PLMI of 174.3. ?Not using CPAP. Had a fall in 02/2020 c/b SDH, and family noticed more memory problems since then Was diagnosed with a neuroendocrine tumor in 11/22, and had surgery in 1/23. She had LP done in 2022 at [facility] and did some CSF Studies.      Overall, patient presents with possible NPH and possible Alzheimer's dementia.   --Spoke with patient/family about possibilty of lumbar drain trial but patient/family stillw ants to think about it before trial.         Author:  Venetia Night. Akshath Mccarey, MD 09/20/2022 10:22 AM

## 2022-09-21 ENCOUNTER — Ambulatory Visit: Payer: TRICARE (CHAMPUS)

## 2022-10-02 ENCOUNTER — Ambulatory Visit: Payer: TRICARE (CHAMPUS)

## 2022-10-03 ENCOUNTER — Ambulatory Visit: Payer: TRICARE (CHAMPUS)

## 2022-10-10 ENCOUNTER — Ambulatory Visit: Payer: MEDICARE

## 2022-10-16 NOTE — Progress Notes
Geriatric Outpatient Note  Patient: Jodi Bentley  MRN: [id number]  Date of Service: 10/17/2022  PCP: Sheilah Mins, Cyndia Skeeters., MD    Chief Complaint:     Chief Complaint   Patient presents with    Health Maintenance    Medication Reconciliation     done          Subjective:   Jodi Bentley is a 81 y.o. female presenting with daughter and husband for followup visit.    #MCI  -Per recent MRI results and [place] neuro notes, possibly NPH (cognitive impairment and gait abnormality also support this). Patient and daughter had neuro video visit 11/30 - discussed lumbar drain trial; family reports that they decided against this as they were told that it would help with her imbalance/gait but not with memory.  -Continuing rivastigmine; at last visit, plan was to revisit cognitive testing after anxiety improved   -recently took wrong elevators a while ago to go to familiar place in her living facility    #Anxiety   - seen by psych on 11/16 - buspar 10mg  added, add'l 10mg  as needed for social events  -reports that her anxiety has significantly improved since then    #Constipation  #Intermittent lower abdominal pain  #Hx small intestine neuroendocrine tumor  -recommended senna and 17.2mg  and OTC stool softener; she has stopped senna. Sometimes stools too loose, so holds some of bowel meds   -lower stomach pain and belching, relieved by bowel movements   -had neuroendocrine tumor small intestine tumor, has not seen GI since moving to [place] from [place]  -more consistency in the last 10 days         Chronic Medical Conditions:     Past Medical History:   Diagnosis Date    Histoplasmosis      Social:   [] Lives alone    [] Lives with caregiver    [x] Lives with family    (at independent senior living apartment)  [] Lives at assisted living   [] Lives at SNF  [] Active Tobacco Use    [x] No Tobacco Use      Medications:     Medications that the patient states to be currently taking   Medication Sig    bisacodyl (DULCOLAX) 5 mg EC tablet Take 1 tablet (5 mg total) by mouth daily as needed for Constipation.    busPIRone 10 mg tablet Take 1 tablet orally at noon, and take 1 tablet additionally as needed 45 minutes before social events..    Cholecalciferol (VITAMIN D3) 10 mcg (400 units) CAPS Take by mouth daily.    loratadine 10 mg tablet Take 1 tablet (10 mg total) by mouth daily.    pantoprazole 40 mg DR tablet Take 1 tablet (40 mg total) by mouth daily.    polyethylene glycol powder packet Take 1 packet (17 g total) by mouth daily.    rivastigmine 3 mg capsule Take 1 capsule (3 mg total) by mouth two (2) times daily.    venlafaxine 150 mg 24 hr capsule Take 2 capsules (300 mg total) by mouth daily.        Objective:   Vital Signs:  BP 125/78  ~ Pulse 70  ~ Temp 36.3 ?C (97.3 ?F) (Forehead)  ~ Resp 16  ~ Ht 5' 8'' (1.727 m)  ~ Wt 168 lb 14.4 oz (76.6 kg)  ~ SpO2 95%  ~ BMI 25.68 kg/m?   Wt Readings from Last 10 Encounters:   10/17/22 168 lb 14.4 oz (76.6 kg)  09/20/22 165 lb (74.8 kg)   09/17/22 166 lb (75.3 kg)   09/05/22 166 lb 8 oz (75.5 kg)   07/31/22 169 lb (76.7 kg)   07/10/22 168 lb (76.2 kg)   03/27/22 170 lb (77.1 kg)   02/06/22 167 lb (75.8 kg)       Physical Exam:  GEN: No apparent distress, A&O x 3  Eyes: sclera anicteric, conj clear, EOMI, pupils equal  ENT: oropharynx clear without thrush or lesions, hearing grossly intact to voice  Neck: supple, non-tender, no thyroid-tenderness  Lymph: no cervical or supraclavicular LAD  CVS: NS1, S2, RRR, no murmurs, clicks or rubs. +2 radial and Dorsalis Pedis pulses, no edema  Resp: Bilateral Clear to Ausculation, good inspiratory effort  GI: soft, non-tender, non-distended abdomen, +BS, no organomegaly appreciated  Skin: warm to touch, no rashes  Pysch: normal mood and affect, alert and oriented x 3.   Neuro: CNII-XII grossly intact, sensation to light touch intact bilaterally  Cognition:   MOCA: 20/30  Deficits: unable to copy 3D shape, 2/3 on serial 7 subtraction, 0/1 language fluency (able to name 7 words), 0/5 delayed recall (with MIS 8/15), and 4/6 orientation (thought date was 26th instead of 27th, did not know day of week)    Labs/Studies:     Lab Results   Component Value Date    WBC 11.40 (H) 10/02/2022    HGB 12.4 10/02/2022    HCT 37.4 10/02/2022    MCV 93.7 10/02/2022    PLT 199 10/02/2022     Lab Results   Component Value Date    CREAT 1.14 07/31/2022    BUN 11 07/31/2022    NA 139 07/31/2022    K 3.6 07/31/2022    CL 100 07/31/2022    CO2 27 07/31/2022     Lab Results   Component Value Date    ALT 12 07/31/2022    AST 31 07/31/2022    ALKPHOS 103 07/31/2022    BILITOT 0.3 07/31/2022     Lab Results   Component Value Date    TSH 2.3 10/02/2022     Lab Results   Component Value Date    HGBA1C 5.4 07/31/2022     Lab Results   Component Value Date    CHOL 226 07/31/2022    CHOLHDL 75 07/31/2022    CHOLDLCAL 129 (H) 07/31/2022    TRIGLY 110 07/31/2022         Impression & Plan:   Jodi Bentley is a 81 y.o. female with the following:    1. MCI (mild cognitive impairment)  Patient's MOCA score 20/30 today without significant concern for confounding by anxiety. Counseled patient and family; for now, her impaired cognition is not significantly interfering with ADLs or iADLs and no further interventions are necessary. Will continue to closely follow her cognition     2. Other malignant neuroendocrine tumors (HCC/RAF)  - Referral to Gastroenterology  3. Intermittent lower abdominal pain  - Referral to Gastroenterology  Patient's intermittent lower abdominal pain seems to correlate with episodes of constipation, and resolves with bowel movements. No concern for obstruction or any acute processes. Family is interested in establishing care with GI, as she has hx of neuroendocrine tumor, used to regularly see GI in [place], and has not yet established care with GI since moving to [place].     4. GAD (generalized anxiety disorder)  Patient's anxiety has been improving with addition of buspirone. Per daughter, she has a followup with psych  next week.    The above plan of care, diagnosis, orders, and follow-up were discussed with the patient and/or their caregiver.  Questions related to this recommended plan of care were answered.    I have spent 60 minutes with this patient. Over 50% of the encounter was spent in counseling on MCI and coordination of patient care. The above recommendations were discussed with the patient. The patient had all questions answered and expressed understanding of the plan.    Follow Up: No follow-ups on file.  Future Appointments   Date Time Provider Department Center   11/15/2022  2:40 PM Corena Herter., MD SLEEP [facility]   11/16/2022 10:40 AM Myriam Forehand., MD NEU TO [place]/   11/28/2022 11:00 AM Sarina Ser., MD HEM/ONC [facility]   01/02/2023 10:30 AM Jonette Mate, MD [facility] [address]/Cen   01/08/2023  9:20 AM EIC MAM01 MAM EIC [facility]   05/02/2023  2:30 PM [facility] DXA01 DXA [facility] [place]/       There are no Patient Instructions on file for this visit.          Signed: Jonette Mate, MD 10/18/2022 4:38 PM

## 2022-10-17 ENCOUNTER — Ambulatory Visit: Payer: TRICARE (CHAMPUS) | Attending: Student in an Organized Health Care Education/Training Program

## 2022-10-17 DIAGNOSIS — R1030 Lower abdominal pain, unspecified: Secondary | ICD-10-CM

## 2022-10-18 DIAGNOSIS — G3184 Mild cognitive impairment, so stated: Secondary | ICD-10-CM

## 2022-10-19 NOTE — Progress Notes
Attending attestation:    I have seen and examined the patient, and discussed the case with the fellow, Dr. Fazel.  I agree with the history, examination findings, and assessment as above[place]  [id number]

## 2022-11-13 ENCOUNTER — Ambulatory Visit: Payer: TRICARE (CHAMPUS)

## 2022-11-15 ENCOUNTER — Ambulatory Visit: Payer: TRICARE (CHAMPUS)

## 2022-11-16 ENCOUNTER — Ambulatory Visit: Payer: TRICARE (CHAMPUS) | Attending: Neurology

## 2022-11-16 DIAGNOSIS — R413 Other amnesia: Secondary | ICD-10-CM

## 2022-11-16 DIAGNOSIS — F09 Unspecified mental disorder due to known physiological condition: Secondary | ICD-10-CM

## 2022-11-16 MED ORDER — RIVASTIGMINE TARTRATE 4.5 MG PO CAPS
4.5 mg | ORAL_CAPSULE | Freq: Two times a day (BID) | ORAL | 1 refills | Status: AC
Start: 2022-11-16 — End: ?

## 2022-11-16 NOTE — Consults
Minor Neurology    PATIENT:  Jodi Bentley  MRN: [id number]  DOB:  1941/07/08  DATE OF SERVICE: 11/16/2022    PRIMARY CARE PROVIDER: Sheilah Mins, Cyndia Skeeters., MD    REASON FOR REFERRAL: Patient seen in consultaton at the request of Jodi Mins, Cyndia Skeeters., MD for the evaluation of dementia    History of Present Illness   Jodi Bentley is a 82 y.o. woman with progressive cognitive decline here today fro further evaluation together with her husband and daughter.    Jodi Bentley is able to appreciate that things aren't quite where they should be butt does also not seem to appreciate her difficulties to the level that other's around her are. Her concept of the duration of any deficits is also quite off though she was very accurately able to calculate on the spot how long ago it was she retired from work. Exhibited on a few occasions at today's visit loss of train of thought and had some difficulty accessing certain words during conversation.     2020 first note made of some memory concerns in physician notes. Husband was noting some mild memory changes at that time. Also had some falls. Had a fall in autumn 2021 with small SDH. In the wake of that daughter feels there was more notable changes. In March 2022 after further work-up given a diagnosis of MCI 2022.   Has continued to follow with geriatrics physician with repeat MOCA assessments since then. Last score by report was 20/30 (on 10/17/22)     In March [id number] had a large volume LP NPH assessment. 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     No notable alcohol history. No additional noted head trauma history. No known toxin exposures.     Takes Rivastigmine 3mg  BID  Takes 300mg  of Venlafaxine daily plus 15mg  of Buspirone    One of her first cousins with diagnosis of Alzheimer's disease. In retrospect Jodi Bentley recalls some memory changes in Jodi Bentley's mother. She had notable kyphosis which impacted breathing some also.     Past Medical History  Past Medical History:   Diagnosis Date    Histoplasmosis      Medications   Outpatient Medications Prior to Visit   Medication Sig    bisacodyl (DULCOLAX) 5 mg EC tablet Take 1 tablet (5 mg total) by mouth daily as needed for Constipation.    busPIRone 15 mg tablet Take 1 tablet (15 mg total) by mouth daily.    Cholecalciferol (VITAMIN D3) 10 mcg (400 units) CAPS Take by mouth daily.    loratadine 10 mg tablet Take 1 tablet (10 mg total) by mouth daily.    pantoprazole 40 mg DR tablet Take 1 tablet (40 mg total) by mouth daily.    polyethylene glycol powder packet Take 1 packet (17 g total) by mouth daily.    rivastigmine 3 mg capsule Take 1 capsule (3 mg total) by mouth two (2) times daily.    senna 8.6 mg tablet Take 2 tablets by mouth daily.    venlafaxine 150 mg 24 hr capsule Take 2 capsules (300 mg total) by mouth daily.     No facility-administered medications prior to visit.     Allergies:   Allergies   Allergen Reactions    Ace Inhibitors Cough     Other reaction(s): Other (See Comments)  cough  cough  cough      Sulfasalazine     Sulfa Antibiotics Rash and Other (See Comments)       Family History:    No family history on file.    Social History:   Social History     Tobacco Use    Smoking status: Former     Types: Cigarettes    Smokeless tobacco: Never        Review of Systems:  14 point review of systems reviewed    Objective:     Last Recorded Vital Signs:    11/16/22 1059   BP: 121/75   Pulse: 78   Resp: 18     General Examination:  Head: NCAT   Eyes: VFF EOMI  Neck: some reduced ROM  Respiratory: No noted respiratory distress  Back: Grossly normal curvature  Musculoskeletal. No clear restrictions to ROM in limbs    Mental Status: alert, cooperative, normal speech  Able to repeat though needed to repeat the sentence on tow occasions.  Interlocking digit tasks wasn't able to do some of them but when de-constructed and then slowly reconstructed she was able to follow and execute appropriately.   Confrontational naming with some noted delays in coming up with the word however did so on all but the word sole. When challenged later on what is a sole. She first clarified the spelling S.O.L.E and then reported it was a type of fish.     MOCA: 20/30 (from other note 10/17/22)  Deficits: unable to copy 3D shape, 2/3 on serial 7 subtraction, 0/1 language fluency (able to name 7 words), 0/5 delayed recall (with MIS 8/15), and 4/6 orientation (thought date was 26th instead of 27th, did not know day of week)    Cranial Nerves:  II-visual fields full.  III, IV, VI-PERRL, EOMI, no nystagmus, conjugate gaze  VII-facial expression symmetric.    VIII-hearing intact.   IX, X-no dysarthria.   XI-sternocleidomastoid and trapezius intact.      Motor Exam: symmetric bulk, symmetric tone, no pronator drift. Power 5/5 bilaterally, no notable tremor  Sensory: intact light touch in all extremities. Narrow based stance (not feet together) with eyes closed was with sway but no fall  Coordination:  intact bilaterally, no dysmetria/ataxia  Gait: steady     Lab Review: reviewed  B12 1216  TSH 5  Vit D and folate wnls    Imaging:   MRI Brain 03/13/22:  1. No acute infarct or hemorrhage.  2. Lateral ventricles are slightly dilated out of proportion to the degree of atrophy, correlate for normal pressure hydrocephalus.  3. Hemosiderin staining along the right frontal lobe and smaller amount along left frontal lobe, likely from prior hemorrhage.  4. Generalized cerebral atrophy with asymmetrically increased atrophy of the parietal lobes.    MRI Brain 01/25/21:  Cerebral atrophy without evidence of acute intracranial   abnormality. Although there is prominent dilation of lateral ventricles,   overall features do not favor NPH.   Assessment:   Jodi Bentley is a 82 y.o. woman with progressive cognitive decline here today fro further evaluation together with her husband and daughter.    Jodi Bentley is able to appreciate that things aren't quite where they should be butt does also not seem to appreciate her difficulties to the level that other's around her are. Her concept of the duration of any deficits is also quite off though she was very accurately able to calculate on the spot how long ago it was she retired  from work. Exhibited on a few occasions at today's visit loss of train of thought and had some difficulty accessing certain words during conversation.     2020 first note made of some memory concerns in physician notes. Husband was noting some mild memory changes at that time. Also had some falls. Had a fall in autumn 2021 with small SDH. In the wake of that daughter feels there was more notable changes. In March 2022 after further work-up given a diagnosis of MCI 2022.   Has continued to follow with geriatrics physician with repeat MOCA assessments since then. Last score by report was 20/30 (on 10/17/22)     In March [id number] had a large volume LP NPH assessment. 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     No notable alcohol history. No additional noted head trauma history. No known toxin exposures.     Takes Rivastigmine 3mg  BID.  Takes 300mg  of Venlafaxine daily plus 15mg  of Buspirone. We spoke about this dosing and if is good to know that she has a psychiatrist DR Areta Haber managing/monitoring these meds.     One of her first cousins with diagnosis of Alzheimer's disease. In retrospect Jodi Bentley recalls some memory changes in Jodi Bentley's mother. She had notable kyphosis which impacted breathing some also.     Plan/ Recommendation:   FDG PET Brain scan  Rivastigmine increase to 4.5mg  BID  Spoke about exercise (cognitive and physical)  RTC in 4 months on a Monday. Will also schedule an add on VV after the PET scan    A total 80 minutes was taken for the encounter including chart review, the visit, note writing and orders. The majority of the time was spent in the coordination of care     Author:  Coralee North. Vivi Ferns, MD 11/16/2022

## 2022-11-16 NOTE — Patient Instructions
PET brain    Rivastigmine to 4.5mg  BID    RTC in 4 months on a Monday. Will also schedule an add on VV after the PET scan

## 2022-11-28 ENCOUNTER — Ambulatory Visit: Payer: TRICARE (CHAMPUS) | Attending: Gastroenterology

## 2022-11-28 ENCOUNTER — Telehealth: Payer: TRICARE (CHAMPUS)

## 2022-11-28 DIAGNOSIS — R350 Frequency of micturition: Secondary | ICD-10-CM

## 2022-11-28 DIAGNOSIS — C7A8 Other malignant neuroendocrine tumors: Secondary | ICD-10-CM

## 2022-11-28 DIAGNOSIS — N390 Urinary tract infection, site not specified: Secondary | ICD-10-CM

## 2022-11-28 NOTE — Patient Instructions
Return after scans.

## 2022-11-28 NOTE — Interdisciplinary
Patient presented to clinic for blood draw. Labs drawn via venipuncture from patients left arm.

## 2022-11-28 NOTE — Progress Notes
HISTORY OF PRESENT ILLNESS:  Jodi Bentley is a 82 y.o. female who is 2.5 weeks s/p diagnostic laparoscopy and small bowel resection for pT4N1 well-differentiated neuroendocrine tumors, multifocal (x4) on 11/07/2021. Surgical pathology showed two of thirty-one lymph nodes positive for metastatic tumor (2/31). Postoperative course significant for peri-incisional ecchymosis on POD1 c/f soft tissue hematoma; incision was opened slightly and explored, with evacuation of clot. Patient also had some postoperative N/V and bloating, which resolved by discharge.    Today, patient states that she was doing fine until she got home. She is now complaining of ''being very, very tired, disoriented, and dizzy.'' She reports burning with urination and increased frequency. She states that her appetite is good. Her abdominal bruise is resolving.    Her husband is working on finding an Best boy for her surveillance in [place]. He is asking for an order for speech therapy. He reports that she previously had an order for home speech therapy, but the company told him that PT that was ordered in the hospital negated the other services that they provided.    History:  Cancer Staging  Mycosis fungoides (CMS-HCC)  Staging form: Mycosis Fungoides and Sezary Syndrome, AJCC 8th Edition  - Clinical: Stage IA (cT1a, cN0, cM0, B0) - Signed by Rush Farmer, MD on 12/22/2020    Neuroendocrine carcinoma of small bowel (CMS-HCC)  Staging form: Neuroendocrine Tumor - Duodenum/Ampulla/Jejunum/Illeum, AJCC 7th Edition  - Pathologic: Stage IIIB (T4(4), N1, cM0) - Signed by Rema Fendt, MD on 11/29/2021    Oncology History  Mycosis fungoides (CMS-HCC)  09/24/2017 Initial Diagnosis  Mycosis fungoides (CMS-HCC)    12/22/2020 - Cancer Staged  Staging form: Mycosis Fungoides and Sezary Syndrome, AJCC 8th Edition  - Clinical: Stage IA (cT1a, cN0, cM0, B0) - Signed by Rush Farmer, MD on 12/22/2020      Neuroendocrine carcinoma of small bowel (CMS-HCC)  11/07/2021 Initial Diagnosis  Neuroendocrine carcinoma of small bowel (CMS-HCC)    11/07/2021 Surgery  [facility]    11/29/2021 - Cancer Staged  Staging form: Neuroendocrine Tumor - Duodenum/Ampulla/Jejunum/Illeum, AJCC 7th Edition  - Pathologic: Stage IIIB (T4(4), N1, cM0) - Signed by Rema Fendt, MD on 11/29/2021        Objective:      PHYSICAL EXAMINATION:  Vitals:  11/23/21 1047  BP: 121/57  Pulse: 87  Resp: 16  Temp: 36.3 ?C (97.3 ?F)  SpO2: 99%    BMI Readings from Last 5 Encounters:  11/23/21 26.89 kg/m?  11/10/21 27.15 kg/m?  11/03/21 27.15 kg/m?  10/12/21 27.31 kg/m?  10/06/21 27.63 kg/m?    GEN: NAD, baseline cognitive impairment  ABD: soft, NT/ND, no palpable mass, hernia, or organomegaly. circumferential resolving ecchymosis from prior hematoma, well approximated healing incision with a small amount of peri-umbilical drainage  EXTREMITIES: No edema  LYMPHATICS: No palpable inguinal adenopathy    Surgical Pathology (09/07/2022):  A: Small bowel, segmental resection:  - Well-differentiated neuroendocrine tumors, multifocal (x4), grade 2, invading through the muscularis propria and extending to the serosal surface and mesenteric margin.  - Proximal and distal resection margins are negative for tumor.  - Two of thirty one lymph nodes, positive for metastatic tumor (2/31).    12 wk-12 mo post-resection:   H&P   For functional tumors, follow-up with  biochemical markers as clinically indicated  (NE-C)  c   Multiphasic abdominal ? pelvic CT or MRIb   Chest CT ? contrast for primary lung/thymus  tumors (as clinically indicated for primary GI  tumors)b  >1 y post-resection to 80 y:   Every 12-24 mo  H&P  For functional tumors, follow-up with  biochemical markers as clinically indicated  (NE-C)  c  Multiphasic abdominal ? pelvic CT or MRIb  Chest CT ? contrast for primary lung/thymus  tumors (as clinically indicated for primary GI  tumors)b  >10 y:   Consider surveillance as clinically indicatedb,f

## 2022-11-29 LAB — UA,Microscopic: WBCS HPF: 30 {cells}/[HPF] — ABNORMAL HIGH (ref 0–4)

## 2022-11-29 LAB — UA,Dipstick

## 2022-11-29 MED ORDER — NITROFURANTOIN MONOHYD MACRO 100 MG PO CAPS
100 mg | ORAL_CAPSULE | Freq: Two times a day (BID) | ORAL | 0 refills | Status: AC
Start: 2022-11-29 — End: ?

## 2022-11-29 NOTE — Consults
DATE OF SERVICE:  11/28/2022     CHIEF COMPLAINT:  History of resected small bowel grade 2 neuroendocrine tumor.    HISTORY OF PRESENT ILLNESS:  Patient is an 82 year old female who has moved from [place] to [place] and is here for of followup. The patient initially presented with abdominal pain and weight loss. She was evaluated and found to have a tumor in the small bowel. She underwent resection on 11/07/2021, was found to have a grade 2 small bowel neuroendocrine tumor. Ki-67 3% to 20%; it was T4 N1, 2 out of 31 lymph nodes were positive. There was no lymphovascular, but there was perineural invasion. There was tumor at the radial or mesenteric margin. The patient has done well postoperatively. 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 She now presents for further evaluation and treatment options. She had an original CT, which showed the mesenteric mass with mesenteric tethering as far as I can tell, has not had since then.    PAST MEDICAL HISTORY:  Significant for histoplasmosis, head trauma, fall, mild cognitive impairment, mycosis fungoides, reportedly cT1a not on any treatment.    FAMILY HISTORY:  Patient's father had stomach cancer.    SOCIAL HISTORY:  Patient is a retired.  History of smoking for 15 years; quit age 54. Does not drink. She is accompanied by her daughter. Her husband also lives with her.    ALLERGIES:  ACE inhibitors, sulfasalazine, sulfa antibiotics.    MEDICATIONS:  Buspirone, vitamin D, loratadine, pantoprazole, rivastigmine, venlafaxine.    PHYSICAL EXAM:  Vital Signs: Weight 74.5, height 170 cm, blood pressure 160/82, pulse 79, temperature 36.6, respiratory rate 18. General: She is a well-developed, well-nourished elderly female in no acute distress. HEENT: Shows head to be normocephalic and atraumatic. Eyes without scleral icterus. Normal pupils. Oropharynx shows moist mucous membranes without mucositis. Neck: Without lymphadenopathy or thyromegaly. Cardiac: Shows regular rhythm and rate. Normal PMI. Lungs: Clear to percussion and auscultation. Abdomen: Soft and nontender without hepatosplenomegaly or masses. There is a well-healed scar. Extremities: Without cyanosis, clubbing, or edema. Lymph Nodes: No axillary, inguinal, or umbilical lymphadenopathy appreciated. Skin: Without rash or tumor. Neurologic: Patient does appear somewhat easily confused. Moves all 4 extremities. Cranial nerves 2 through 12 grossly intact.    MEDICAL DECISION MAKING AND:    ASSESSMENT AND PLAN:  1. Grade 2 neuroendocrine tumor. The patient is here for followup. Repeat imaging is reasonable and we will attempt to get Dotatate scan at the same time.   2. Urinary frequency. We will check urinalysis today. There is pyuria and we will contact regarding antibiotics. We will see the patient after her imaging.      I spent more than 60 minutes evaluating the chart, face-to-face, and coordinating care.      Tye [place]. Michele Mcalpine, MD [phone])        JRH/MODL CONF#: [id number]  D: 11/28/2022 19:43:57 T: 11/29/2022 06:16:04 DOCUMENT: [id number]

## 2022-11-29 NOTE — Telephone Encounter
+   UA. Unable to reach pt. Spoke with daughter. Had nitrofurantoin few months ago without problems. Put in order. To f/u with PMD.    Trudie Reed, MD

## 2022-11-30 LAB — Bacterial Culture Urine: BACTERIAL CULTURE URINE: NO GROWTH

## 2022-12-05 ENCOUNTER — Ambulatory Visit: Payer: TRICARE (CHAMPUS)

## 2022-12-12 ENCOUNTER — Inpatient Hospital Stay: Payer: MEDICARE | Attending: Neurology

## 2022-12-12 DIAGNOSIS — R413 Other amnesia: Secondary | ICD-10-CM

## 2022-12-12 DIAGNOSIS — F09 Unspecified mental disorder due to known physiological condition: Secondary | ICD-10-CM

## 2022-12-12 MED ADMIN — PET ISOTOPE 18-F FDG: 9.83 | INTRAVENOUS | @ 22:00:00 | Stop: 2022-12-12

## 2022-12-25 ENCOUNTER — Ambulatory Visit: Payer: TRICARE (CHAMPUS)

## 2022-12-25 ENCOUNTER — Ambulatory Visit: Payer: MEDICARE

## 2022-12-26 NOTE — Telephone Encounter
Hello,    Appointment was cancelled.    Thank you     Jodi Bentley-

## 2022-12-31 ENCOUNTER — Ambulatory Visit: Payer: MEDICARE | Attending: Gastroenterology

## 2023-01-02 ENCOUNTER — Ambulatory Visit: Payer: MEDICARE | Attending: Student in an Organized Health Care Education/Training Program

## 2023-01-04 ENCOUNTER — Ambulatory Visit: Payer: TRICARE (CHAMPUS)

## 2023-01-07 ENCOUNTER — Ambulatory Visit: Payer: TRICARE (CHAMPUS) | Attending: Neurology

## 2023-01-07 ENCOUNTER — Telehealth: Payer: TRICARE (CHAMPUS)

## 2023-01-07 NOTE — Telephone Encounter
Provider out due to an emergency.     Called pt. No answer L/M. Sent Text message about rescheduling.     No accommodation instructions at this moment, pending update from provider.     Rescheduled to first urgent available opening. Placed on high priority wait list.

## 2023-01-08 ENCOUNTER — Ambulatory Visit: Payer: MEDICARE

## 2023-01-14 ENCOUNTER — Ambulatory Visit: Payer: TRICARE (CHAMPUS)

## 2023-01-14 ENCOUNTER — Telehealth: Payer: MEDICARE | Attending: Neurology

## 2023-01-14 DIAGNOSIS — F02A Mild major neurocognitive disorder due to Alzheimer's disease without behavioral disturbance (HCC/RAF): Secondary | ICD-10-CM

## 2023-01-14 DIAGNOSIS — G309 Alzheimer's disease, unspecified: Secondary | ICD-10-CM

## 2023-01-14 NOTE — Progress Notes
Patient Consent to Telehealth   The patient agreed to participate in the video visit prior to joining the visit.       [facility] TELEHEALTH    PATIENT:  Jodi Bentley  MRN: [id number]  DOB:  30-Nov-1940  DATE OF SERVICE: 01/14/2023    PRIMARY CARE PROVIDER: Sheilah Mins, Cyndia Skeeters., MD    REASON FOR REFERRAL: Patient seen in consultaton at the request of Sheilah Mins, Cyndia Skeeters., MD for the evaluation of dementia    History of Present Illness   Jodi Bentley is a 82 y.o. woman with progressive cognitive decline here today fro further evaluation together with her husband and daughter.    [name] is able to appreciate that things aren't quite where they should be butt does also not seem to appreciate her difficulties to the level that other's around her are. Her concept of the duration of any deficits is also quite off though she was very accurately able to calculate on the spot how long ago it was she retired from work. Exhibited on a few occasions at today's visit loss of train of thought and had some difficulty accessing certain words during conversation.     2020 first note made of some memory concerns in physician notes. Husband was noting some mild memory changes at that time. Also had some falls. Had a fall in autumn 2021 with small SDH. In the wake of that daughter feels there was more notable changes. In March 2022 after further work-up given a diagnosis of MCI 2022.   Has continued to follow with geriatrics physician with repeat MOCA assessments since then. Last score by report was 20/30 (on 10/17/22)     In March 19147 had a large volume LP NPH assessment. 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     No notable alcohol history. No additional noted head trauma history. No known toxin exposures.     Takes Rivastigmine 3mg  BID  Takes 300mg  of Venlafaxine daily plus 15mg  of Buspirone    One of her first cousins with diagnosis of Alzheimer's disease. In retrospect Richard recalls some memory changes in Jodi Bentley's mother. She had notable kyphosis which impacted breathing some also.     Interval History 01/14/23:  Jodi Bentley at the end of Feb and had a femoral fracture (tripped over her pant leg). Still in rehab - has been for the past 3 weeks. Is making progress as per her family.   FDG PET brain scan supporting an Alzheimer's etiology. We spoke about the previous findings on MRI  - that of hemorrhage and the history of SDH in 2021. Interestingly 2022 imaging report without mention of any old blood. Question of interval bleed. Daughter will get image from [facility] and we can have radiology compare to the current imaging. If new that may be further barrier to [place] if all dates back to 2201 then may still consider and could pursue amyloid PET scan.     Past Medical History  Past Medical History:   Diagnosis Date    Histoplasmosis      Medications   Outpatient Medications Prior to Visit   Medication Sig    bisacodyl (DULCOLAX) 5 mg EC tablet Take 1 tablet (5 mg total) by mouth daily as needed for Constipation.    busPIRone 15 mg tablet Take 1 tablet (15 mg total) by  mouth daily.    Cholecalciferol (VITAMIN D3) 10 mcg (400 units) CAPS Take by mouth daily.    loratadine 10 mg tablet Take 1 tablet (10 mg total) by mouth daily.    pantoprazole 40 mg DR tablet Take 1 tablet (40 mg total) by mouth daily.    polyethylene glycol powder packet Take 1 packet (17 g total) by mouth daily.    rivastigmine 4.5 mg capsule Take 1 capsule (4.5 mg total) by mouth two (2) times daily before meals.    venlafaxine 150 mg 24 hr capsule Take 2 capsules (300 mg total) by mouth daily.     No facility-administered medications prior to visit.     Allergies:   Allergies   Allergen Reactions    Ace Inhibitors Cough     Other reaction(s): Other (See Comments)  cough  cough  cough      Sulfasalazine     Sulfa Antibiotics Rash and Other (See Comments) Family History:    No family history on file.    Social History:   Social History     Tobacco Use    Smoking status: Former     Types: Cigarettes    Smokeless tobacco: Never        Review of Systems:  Review of systems completed    Objective:     There were no vitals filed for this visit.    General Examination:  Head: NCAT   Eyes: EOMI  Respiratory: No noted respiratory distress    Mental Status: alert, cooperative.  A few of her questions seemed a bit mixed up today but other things she said were reasonable.     MOCA: 20/30 (from other note 10/17/22)  Deficits: unable to copy 3D shape, 2/3 on serial 7 subtraction, 0/1 language fluency (able to name 7 words), 0/5 delayed recall (with MIS 8/15), and 4/6 orientation (thought date was 26th instead of 27th, did not know day of week)    Cranial Nerves:  III, IV, VI-conjugate gaze  VII-facial expression symmetric.    VIII-hearing intact.   IX, X-no dysarthria.     Lab Review: reviewed  B12 1216  TSH 5  Vit D and folate wnls    Imaging:   FDG PET Brain 12/13/22:  1. Abnormal distribution of FDG, most severely affecting uptake of bilateral posterior cingulate and parietotemporal cortices. This pattern would be most consistent with a progressive neuro-degenerative disorder, such as occurs in AD.     2. Structural changes demonstrating cerebral atrophy    MRI Brain 03/13/22:  1. No acute infarct or hemorrhage.  2. Lateral ventricles are slightly dilated out of proportion to the degree of atrophy, correlate for normal pressure hydrocephalus.  3. Hemosiderin staining along the right frontal lobe and smaller amount along left frontal lobe, likely from prior hemorrhage.  4. Generalized cerebral atrophy with asymmetrically increased atrophy of the parietal lobes.    MRI Brain 01/25/21:  Cerebral atrophy without evidence of acute intracranial   abnormality. Although there is prominent dilation of lateral ventricles,   overall features do not favor NPH.   Assessment:   Makaylynn Bonillas is a 82 y.o. woman with progressive cognitive decline here today fro further evaluation together with her husband and daughter.    [name] is able to appreciate that things aren't quite where they should be butt does also not seem to appreciate her difficulties to the level that other's around her are. Her concept of the duration of any deficits is also  quite off though she was very accurately able to calculate on the spot how long ago it was she retired from work. Exhibited on a few occasions at today's visit loss of train of thought and had some difficulty accessing certain words during conversation.     2020 first note made of some memory concerns in physician notes. Husband was noting some mild memory changes at that time. Also had some falls. Had a fall in autumn 2021 with small SDH. In the wake of that daughter feels there was more notable changes. In March 2022 after further work-up given a diagnosis of MCI 2022.     Has continued to follow with geriatrics physician with repeat MOCA assessments since then. Last score by report was 20/30 (on 10/17/22)     In March 81191 had a large volume LP NPH assessment. 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     No notable alcohol history. No additional noted head trauma history. No known toxin exposures.     Takes Rivastigmine 4.5mg  BID.  Takes 300mg  of Venlafaxine daily plus 15mg  of Buspirone. We spoke about this dosing and if is good to know that she has a psychiatrist DR Areta Haber managing/monitoring these meds.     One of her first cousins with diagnosis of Alzheimer's disease. In retrospect Richard recalls some memory changes in Naw's mother. She had notable kyphosis which impacted breathing some also.     Fell at the end of Feb and had a femoral fracture (tripped over her pant leg). Still in rehab - has been for the past 3 weeks. Is making progress as per her family. FDG PET brain scan supporting an Alzheimer's etiology. We spoke about the previous findings on MRI  - that of hemorrhage and the history of SDH in 2021. Interestingly 2022 imaging report without mention of any old blood. Question of interval bleed. Daughter will get image from [facility] and we can have radiology compare to the current imaging. If new that may be further barrier to [place] if all dates back to 2201 then may still consider and could pursue amyloid PET scan.     Plan/ Recommendation:   They will get hold of prior MRI (2022 at [facility]) for radiology review/comparison  Rivastigmine increase to 4.5mg  BID  Spoke about exercise (cognitive and physical)  Next steps once we have the above imaging review.   RTC already schedule in June    Author:  Coralee North. Vivi Ferns, MD 01/14/2023

## 2023-01-15 ENCOUNTER — Telehealth: Payer: TRICARE (CHAMPUS)

## 2023-01-15 NOTE — Telephone Encounter
Call Back Request      Reason for call back: Patient's daughter is calling to follow up on imaging on a disc that was sent in the mail to Dr. Silverio Decamp. Manuela Schwartz states she received confirmation last year that disc was received. Manuela Schwartz states patient just had an appointment with Dr. Leda Quail in [place] and he was unable to find imaging in [place]. Manuela Schwartz is asking if imaging from [facility] can be uploaded to [facility], so every provider is able to view.     Any Symptoms:  []  Yes  [x]  No      If yes, what symptoms are you experiencing:    Duration of symptoms (how long):    Have you taken medication for symptoms (OTC or Rx):      If call was taken outside of clinic hours:    [] Patient or caller has been notified that this message was sent outside of normal clinic hours.     [] Patient or caller has been warm transferred to the physician's answering service. If applicable, patient or caller informed to please call us back if symptoms progress.  Patient or caller has been notified of the turnaround time of 1-2 business day(s).

## 2023-01-15 NOTE — Telephone Encounter
.  replynoted

## 2023-01-16 ENCOUNTER — Ambulatory Visit: Payer: TRICARE (CHAMPUS) | Attending: Gastroenterology

## 2023-01-21 ENCOUNTER — Telehealth: Payer: TRICARE (CHAMPUS)

## 2023-01-21 NOTE — Telephone Encounter
PDL Call to Clinic    Reason for Call:Patient's daughter, Manuela Schwartz is calling to confirm if the clinic received the patient's MRI disk.     Appointment Related?  []  Yes  [x]  No     If yes;  Date:  Time:    Call warm transferred to PDL: [x]  Yes  []  No    Call Received by Clinic Representative: Joe    If call not answered/not accepted, call received by Patient Services Representative:

## 2023-01-23 NOTE — Telephone Encounter
Call Back Request      Reason for call back: Patient's calling to speak with Joe regarding CD disc. Please advise if images have been found.     Any Symptoms:  []  Yes  [x]  No      If yes, what symptoms are you experiencing:    Duration of symptoms (how long):    Have you taken medication for symptoms (OTC or Rx):      If call was taken outside of clinic hours:    [] Patient or caller has been notified that this message was sent outside of normal clinic hours.     [] Patient or caller has been warm transferred to the physician's answering service. If applicable, patient or caller informed to please call us back if symptoms progress.  Patient or caller has been notified of the turnaround time of 1-2 business day(s).

## 2023-01-23 NOTE — Telephone Encounter
Forwarded by:  Delene 

## 2023-01-25 NOTE — Telephone Encounter
PDL Call to Clinic    Reason for Call: pt's daughter is adamant to speak with Joe about images. She states Joe told her he would check a box in clinic to see if we have the CD.     MRI brain CD from 01/25/21 done at [facility].   Dr. Vivi Ferns needs to see them.      Appointment Related?  []  Yes  [x]  No     If yes;  Date:  Time:    Call warm transferred to PDL: [x]  Yes  []  No    Call Received by Clinic Representative: Marla Roe     Thank you    If call not answered/not accepted, call received by Patient Services Representative:

## 2023-01-25 NOTE — Telephone Encounter
Call Back Request      Reason for call back: Patient's daughter calling again adamant to speak to Joe in clinic regarding imaging disc. Please reach out to patient's daughter as she states she has called multple times trying to get confirmation and nobody calls her back. She will try calling again.     Any Symptoms:  []  Yes  [x]  No      If yes, what symptoms are you experiencing:    Duration of symptoms (how long):    Have you taken medication for symptoms (OTC or Rx):      If call was taken outside of clinic hours:    [] Patient or caller has been notified that this message was sent outside of normal clinic hours.     [] Patient or caller has been warm transferred to the physician's answering service. If applicable, patient or caller informed to please call us back if symptoms progress.  Patient or caller has been notified of the turnaround time of 1-2 business day(s).

## 2023-01-25 NOTE — Telephone Encounter
Forwarded by:  Delene 

## 2023-01-28 ENCOUNTER — Telehealth: Payer: TRICARE (CHAMPUS)

## 2023-01-28 NOTE — Telephone Encounter
Request for medical records faxed to [facility] radiology at [phone] with a confirmation reciept

## 2023-01-29 NOTE — Telephone Encounter
Pt's daughter was informed that we do not have the disk anymore and requested a new one from [facility] radiology to be mailed

## 2023-02-03 ENCOUNTER — Ambulatory Visit: Payer: TRICARE (CHAMPUS)

## 2023-02-08 ENCOUNTER — Inpatient Hospital Stay: Payer: TRICARE (CHAMPUS)

## 2023-02-08 DIAGNOSIS — R6889 Other general symptoms and signs: Secondary | ICD-10-CM

## 2023-02-14 NOTE — Telephone Encounter
I have called the patient back in an attempt to let the patient know we do not have CD in office. I have left a detailed voicemail asking there patient to call the office back if they needed more information DA          Message Complete, No further Action Required.   Ok to [employer]

## 2023-02-22 ENCOUNTER — Telehealth: Payer: TRICARE (CHAMPUS)

## 2023-02-22 NOTE — Telephone Encounter
Call Back Request      Reason for call back: Karin Golden with [name] calling to ask if the medication list request was received by fax. Please CB with the status [phone]    Any Symptoms:  []  Yes  []  No      If yes, what symptoms are you experiencing:    Duration of symptoms (how long):    Have you taken medication for symptoms (OTC or Rx):      If call was taken outside of clinic hours:    [] Patient or caller has been notified that this message was sent outside of normal clinic hours.     [] Patient or caller has been warm transferred to the physician's answering service. If applicable, patient or caller informed to please call us back if symptoms progress.  Patient or caller has been notified of the turnaround time of 1-2 business day(s).  Thank You

## 2023-02-22 NOTE — Telephone Encounter
Received medication list, placed in Dr. Leticia Clas folder for signature.

## 2023-02-24 ENCOUNTER — Ambulatory Visit: Payer: TRICARE (CHAMPUS)

## 2023-02-24 NOTE — Telephone Encounter
Hi Dr. Lawana Pai,     [facility] for this patient. Thank you.

## 2023-02-25 NOTE — Telephone Encounter
Pt's daughter, [name], advised:  02/27-pt broke her femur  02/28-She had surgery at [facility]   03/3-04/29- pt was in a SNF  05/03-pt and spouse moved into memory care at [facility]

## 2023-03-01 ENCOUNTER — Telehealth: Payer: TRICARE (CHAMPUS)

## 2023-03-02 NOTE — Telephone Encounter
Call Back Request      Reason for call back:      Symptom: High Blood Pressure - Caller Reports  Outcome: Urgent Call - Do NOT Schedule Appointment  Reason: Systolic (top #) greater than 161    Per pt nurse Angie, pt was anxious earlier this afternoon and had high blood pressure.  Caller reported that follow bps today    2:30 pm     189/105      HR 97  4:20 pm      207/102  4:40pm      174/88    Caller requested to page on call provider.  Beatrice at afterhours said would page Dr. Rinaldo Ratel who is on call.  Thanks.    Call back info:  Angie (nurse)    [name]  (ALF)  [phone]  or  [phone]    Any Symptoms:  [x]  Yes  []  No      If yes, what symptoms are you experiencing:  high bp  Duration of symptoms (how long):  ~1 day  Have you taken medication for symptoms (OTC or Rx):      If call was taken outside of clinic hours:    [x] Patient or caller has been notified that this message was sent outside of normal clinic hours.     [] Patient or caller has been warm transferred to the physician's answering service. If applicable, patient or caller informed to please call us back if symptoms progress.  Patient or caller has been notified of the turnaround time of 1-2 business day(s).

## 2023-03-04 NOTE — Telephone Encounter
Left VM for Nurse Angie to CB, to relay MD message below and to assist in scheduling pt an appointment, please assist if she CB, thank you!

## 2023-03-04 NOTE — Telephone Encounter
Pt's nurse Angie, reported the following Bps on 03/01/23:    2:30 pm     189/105      HR 97  4:20 pm      207/102  4:40pm      174/88

## 2023-03-05 MED ORDER — LORATADINE 10 MG PO TABS
10 mg | ORAL_TABLET | Freq: Every day | ORAL | 3 refills
Start: 2023-03-05 — End: ?

## 2023-03-05 MED ORDER — RIVASTIGMINE TARTRATE 4.5 MG PO CAPS
4.5 mg | ORAL_CAPSULE | Freq: Two times a day (BID) | ORAL | 1 refills
Start: 2023-03-05 — End: ?

## 2023-03-05 MED ORDER — PANTOPRAZOLE SODIUM 40 MG PO TBEC
40 mg | ORAL_TABLET | Freq: Every day | ORAL | 1 refills
Start: 2023-03-05 — End: ?

## 2023-03-06 MED ORDER — LORATADINE 10 MG PO TABS
10 mg | ORAL_TABLET | Freq: Every day | 3 refills | Status: AC
Start: 2023-03-06 — End: ?

## 2023-03-06 MED ORDER — RIVASTIGMINE TARTRATE 4.5 MG PO CAPS
4.5 mg | ORAL_CAPSULE | Freq: Two times a day (BID) | ORAL | 2 refills | Status: AC
Start: 2023-03-06 — End: ?

## 2023-03-07 MED ORDER — LORATADINE 10 MG PO TABS
10 mg | ORAL_TABLET | Freq: Every day | ORAL | 3 refills
Start: 2023-03-07 — End: ?

## 2023-03-08 MED ORDER — PANTOPRAZOLE SODIUM 40 MG PO TBEC
40 mg | ORAL_TABLET | Freq: Every day | ORAL | 1 refills | Status: AC
Start: 2023-03-08 — End: ?

## 2023-03-11 ENCOUNTER — Telehealth: Payer: TRICARE (CHAMPUS)

## 2023-03-11 NOTE — Telephone Encounter
We received an incoming fax from pt's ALF, [facility] advising:  Pt missed a dose of Venlafaxine HCL ER 150 mg 1 cap PO every day

## 2023-03-12 ENCOUNTER — Ambulatory Visit: Payer: TRICARE (CHAMPUS)

## 2023-03-12 ENCOUNTER — Inpatient Hospital Stay: Payer: TRICARE (CHAMPUS)

## 2023-03-12 DIAGNOSIS — G8929 Other chronic pain: Secondary | ICD-10-CM

## 2023-03-12 DIAGNOSIS — M545 Chronic left-sided low back pain without sciatica: Secondary | ICD-10-CM

## 2023-03-12 DIAGNOSIS — R030 Elevated blood-pressure reading, without diagnosis of hypertension: Secondary | ICD-10-CM

## 2023-03-12 MED ORDER — AMLODIPINE BESYLATE 5 MG PO TABS
ORAL_TABLET | 0 refills | Status: AC
Start: 2023-03-12 — End: 2023-03-15

## 2023-03-12 NOTE — Progress Notes
[facility]   Progress Note    CC:   Chief Complaint   Patient presents with    BP issue    Weight Loss    Referrals     Orthopedic          S: Jodi Bentley is a 82 y.o. 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     3 months ago broken left femur  2 months in rehab  Then moved back into independent living at [facility], then moved into assisted living a month ago May 3rd  In the following week, very high blood pressure as high as 200/170s on May 10th  Since May 15th, lower 120-140s/80s  However, did have one low of 88/67 on May 15th.   They have clonidine prn from their former PCP    Left hip pain with sciatica for 5-6 years  Did follow up with surgeon after left hip ORIF  Frustrated she still has sciatic pain when her hip was supposedly fixed  Has not had back imaging  Would be open to [facility]    Has had unintentional weight loss    Past Medical History:   Diagnosis Date    Histoplasmosis        ROS  Negative except as above    Medications that the patient states to be currently taking   Medication Sig    bisacodyl (DULCOLAX) 5 mg EC tablet Take 1 tablet (5 mg total) by mouth daily as needed for Constipation.    busPIRone 15 mg tablet Take 1 tablet (15 mg total) by mouth three (3) times daily.    Cholecalciferol (VITAMIN D3) 10 mcg (400 units) CAPS Take by mouth daily.    LORATADINE 10 mg tablet TAKE 1 TABLET DAILY    pantoprazole 40 mg DR tablet Take 1 tablet (40 mg total) by mouth daily.    polyethylene glycol powder packet Take 1 packet (17 g total) by mouth daily.    rivastigmine 4.5 mg capsule Take 1 capsule (4.5 mg total) by mouth two (2) times daily before meals.    venlafaxine 150 mg 24 hr capsule Take 2 capsules (300 mg total) by mouth daily.    [DISCONTINUED] cloNIDine 0.1 mg tablet Take 1 tablet (0.1 mg total) by mouth two (2) times daily as needed (anxiety, blood pressure).       O: BP 128/82 (BP Location: Left arm, Patient Position: Sitting, Cuff Size: Regular) ~ Pulse 87  ~ Temp 36.3 ?C (97.4 ?F) (Tympanic)  ~ Resp 16  ~ Ht 5' 7'' (1.702 m)  ~ Wt 147 lb (66.7 kg)  ~ SpO2 99%  ~ BMI 23.02 kg/m?   Physical Exam  Constitutional:       General: She is not in acute distress.     Appearance: She is well-developed.   HENT:      Head: Normocephalic and atraumatic.   Cardiovascular:      Rate and Rhythm: Normal rate.   Pulmonary:      Effort: Pulmonary effort is normal.   Neurological:      Mental Status: She is alert.   Psychiatric:         Mood and Affect: Mood normal.         A/P: 82 y.o. 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     Diagnoses and all orders for this visit:    Labile blood pressure - likely 2/2 combination of  Dysautonomia (HCC/RAF) and GAD (generalized anxiety disorder)  -     amLODIPine 5 mg tablet; Take 5 mg as needed for systolic blood pressure above 782 mmHg. If still elevated two hours later, may take an additional 10 mg. Max dose 10 mg/24 hours.  - will avoid daily antihypertensive for now to avoid lows  - f/u with Dr. Areta Haber for anxiety management; from my perspective would rather d/c clonidine as has rebound HTN as side effect, but they can discuss continuing it's use for anxiety    Chronic left-sided low back pain without sciatica  -     XR lumbar spine ap+lat+obl+L5-S1 (5 views); Future  - previously did PT  - anticipate pain management referral      Return in about 17 days (around 03/29/2023) for to discuss weight loss.    The above plan of care, diagnosis, orders, and follow-up were discussed with the patient/caregiver. All questions related to this recommended plan of care were answered.    Janeice Robinson, MD  Family Medicine

## 2023-03-13 ENCOUNTER — Telehealth: Payer: TRICARE (CHAMPUS)

## 2023-03-13 ENCOUNTER — Ambulatory Visit: Payer: TRICARE (CHAMPUS)

## 2023-03-13 DIAGNOSIS — M5136 Other intervertebral disc degeneration, lumbar region: Secondary | ICD-10-CM

## 2023-03-13 NOTE — Telephone Encounter
MD order sent to:    To:  [name]  [phone]  [phone]

## 2023-03-13 NOTE — Telephone Encounter
Called [facility] and spoke to [place] and relayed MD message below.

## 2023-03-13 NOTE — Telephone Encounter
-----   Message from Jonette Mate, MD sent at 03/13/2023  8:00 AM PDT -----  Regarding: Medication dc  Please fax to assisted living facility to discontinue the following meds:  1. Discontinue milk of magnesia  2. Discontinue tussin PM  3. Discontinue Certa Vite Tab

## 2023-03-14 ENCOUNTER — Telehealth: Payer: TRICARE (CHAMPUS)

## 2023-03-14 ENCOUNTER — Ambulatory Visit: Payer: TRICARE (CHAMPUS)

## 2023-03-14 DIAGNOSIS — R0989 Other specified symptoms and signs involving the circulatory and respiratory systems: Secondary | ICD-10-CM

## 2023-03-14 NOTE — Telephone Encounter
Good Afternoon,     Refill Request    Verified patient was seen within the last 6 months. If not seen within 6 months, offered appointment.     Collected prescription information from patient.     Pended orders for provider to review and sign.     Pharmacy location was confirmed and class was set for pended orders.    Patient or caller has been notified of the turnaround time of 1-2 business day(s).

## 2023-03-14 NOTE — Telephone Encounter
We received an incoming fax from [name]:  They are requesting a D/C order for:  Acetaminophen 325 mg PRN   Acetaminophen 500 mg PRN  Bisacodyl 5mg  EC tablet  Docusate Sodium 100mg  PRN  Fllet Enaema- insert PRN

## 2023-03-14 NOTE — Telephone Encounter
The prescription for amlodipine was already sent to [facility] on 5/21 by Dr. Lawana Pai.

## 2023-03-14 NOTE — Telephone Encounter
I called [facility] to ask them what is needed regarding patients amlodipine prescription? I was placed on hold and transferred 3x for 30 minutes. Unfortunately, the call was disconnected before I was given a reference #.   I did give them our office phone number just in case the call disconnected.  When [facility] calls back please get all details needed .

## 2023-03-14 NOTE — Telephone Encounter
We received an incoming fax from [facility] requesting signature for order.

## 2023-03-15 DIAGNOSIS — R0989 Other specified symptoms and signs involving the circulatory and respiratory systems: Secondary | ICD-10-CM

## 2023-03-15 MED ORDER — AMLODIPINE BESYLATE 5 MG PO TABS
ORAL_TABLET | 0 refills | Status: AC
Start: 2023-03-15 — End: 2023-03-23

## 2023-03-15 NOTE — Telephone Encounter
Hello    Pt's husband is in office stating pt is completely out of her BP meds. He is requesting a 3 day written perscription [facility]     Please advise

## 2023-03-15 NOTE — Telephone Encounter
Pt requesting 3 day refill be sent to [facility] [address] in [place]

## 2023-03-15 NOTE — Addendum Note
Addended by: Janean Sark on: 03/15/2023 08:58 AM     Modules accepted: Orders

## 2023-03-16 MED ORDER — AMLODIPINE BESYLATE 5 MG PO TABS
ORAL_TABLET | 0 refills
Start: 2023-03-16 — End: ?

## 2023-03-19 ENCOUNTER — Ambulatory Visit: Payer: MEDICARE

## 2023-03-20 ENCOUNTER — Ambulatory Visit: Payer: TRICARE (CHAMPUS) | Attending: Student in an Organized Health Care Education/Training Program

## 2023-03-21 ENCOUNTER — Telehealth: Payer: TRICARE (CHAMPUS)

## 2023-03-22 ENCOUNTER — Telehealth: Payer: TRICARE (CHAMPUS)

## 2023-03-22 ENCOUNTER — Ambulatory Visit: Payer: TRICARE (CHAMPUS)

## 2023-03-22 DIAGNOSIS — R634 Abnormal weight loss: Secondary | ICD-10-CM

## 2023-03-22 DIAGNOSIS — M545 Chronic left-sided low back pain without sciatica: Secondary | ICD-10-CM

## 2023-03-22 DIAGNOSIS — R0989 Other specified symptoms and signs involving the circulatory and respiratory systems: Secondary | ICD-10-CM

## 2023-03-22 DIAGNOSIS — I951 Orthostatic hypotension: Secondary | ICD-10-CM

## 2023-03-22 DIAGNOSIS — R300 Dysuria: Secondary | ICD-10-CM

## 2023-03-22 DIAGNOSIS — G8929 Other chronic pain: Secondary | ICD-10-CM

## 2023-03-22 DIAGNOSIS — R5383 Other fatigue: Secondary | ICD-10-CM

## 2023-03-22 MED ORDER — AMLODIPINE BESYLATE 5 MG PO TABS
ORAL_TABLET | 1 refills
Start: 2023-03-22 — End: ?

## 2023-03-22 MED ORDER — AMLODIPINE BESYLATE 5 MG PO TABS
ORAL_TABLET | 0 refills | Status: AC
Start: 2023-03-22 — End: ?

## 2023-03-22 MED ADMIN — SODIUM CHLORIDE 0.9 % IV BOLUS: 500 mL | INTRAVENOUS | @ 22:00:00 | Stop: 2023-03-22

## 2023-03-22 NOTE — Telephone Encounter
We received the following fax from ALF [facility]  P. [phone]  F. [phone]       ALF provide medication list uploaded and attached to this encounter

## 2023-03-22 NOTE — Progress Notes
[facility]   Progress Note    CC:   Chief Complaint   Patient presents with    Fatigue     Per daughter sleeping excessively x1 week +18 hours a day      Abdominal Cramping    Constipation        S: Jodi Bentley is a 82 y.o. 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     03/19/23  3 months ago broken left femur  2 months in rehab  Then moved back into independent living at [facility], then moved into assisted living a month ago May 3rd  In the following week, very high blood pressure as high as 200/170s on May 10th  Since May 15th, lower 120-140s/80s  However, did have one low of 88/67 on May 15th.   They have clonidine prn from their former PCP    Left hip pain with sciatica for 5-6 years  Did follow up with surgeon after left hip ORIF  Frustrated she still has sciatic pain when her hip was supposedly fixed  Has not had back imaging  Would be open to [facility]    Has had unintentional weight loss    03/22/23  Here with daughter for fatigue. Sleeping 18 hours per day, which is new since last week. She has done this in the remote past, but not lately. No new medications. Hasn't needed amlodipine. Not eating or drinking that well. Last visit mentioned some unintentional weight loss but did not have time to address it. Some dysuria. Mild lower abdominal pain/cramping with alternating diarrhea and constipation, denies blood. Occasionally dizzy/foggy.      Requesting PT, ST    Past Medical History:   Diagnosis Date    Histoplasmosis        ROS  Negative except as above    Medications that the patient states to be currently taking   Medication Sig    bisacodyl (DULCOLAX) 5 mg EC tablet Take 1 tablet (5 mg total) by mouth daily as needed for Constipation.    busPIRone 15 mg tablet Take 1 tablet (15 mg total) by mouth three (3) times daily.    Cholecalciferol (VITAMIN D3) 10 mcg (400 units) CAPS Take by mouth daily.    cyanocobalamin 1000 mcg tablet Take 1 tablet (1,000 mcg total) by mouth daily. LORATADINE 10 mg tablet TAKE 1 TABLET DAILY    pantoprazole 40 mg DR tablet Take 1 tablet (40 mg total) by mouth daily.    polyethylene glycol powder packet Take 1 packet (17 g total) by mouth daily.    rivastigmine 4.5 mg capsule Take 1 capsule (4.5 mg total) by mouth two (2) times daily before meals.    triamcinolone 0.1% cream     venlafaxine 150 mg 24 hr capsule Take 2 capsules (300 mg total) by mouth daily.     Current Facility-Administered Medications for the 03/22/23 encounter (Office Visit) with Sheilah Mins, Cyndia Skeeters., MD   Medication    sodium chloride 0.9% IV soln bolus 500 mL       O: BP 117/75 (BP Location: Left arm, Patient Position: Standing, Cuff Size: Regular) Comment: dizzy while standing up ~ Pulse 96  ~ Temp 36.1 ?C (97 ?F) (Tympanic)  ~ Resp 16  ~ Wt 152 lb (68.9 kg)  ~ SpO2 96%  ~ BMI 23.81 kg/m?     Orthostatic Values     First Set Last Set   Heart Rate: Heart Rate: 77 (03/22/23  1157) Heart Rate: 96 (03/22/23 1203)   Blood Pressure: BP: 174/95 (03/22/23 1157) BP: 117/75 (dizzy while standing up) (03/22/23 1203)   Position: Patient Position: Lying (03/22/23 1157) Patient Position: Standing (03/22/23 1203)   BP Location: BP Location: Left arm (03/22/23 1157) BP Location: Left arm (03/22/23 1203)   Cuff Size: Cuff Size: Regular (03/22/23 1157) Cuff Size: Regular (03/22/23 1203)   Comments:         Wt Readings from Last 3 Encounters:   03/22/23 152 lb (68.9 kg)   03/12/23 147 lb (66.7 kg)   11/28/22 164 lb 6.4 oz (74.6 kg)       Physical Exam  Constitutional:       General: She is not in acute distress.     Appearance: She is well-developed.   HENT:      Head: Normocephalic and atraumatic.   Cardiovascular:      Rate and Rhythm: Normal rate. Rhythm irregular.   Pulmonary:      Effort: Pulmonary effort is normal.   Abdominal:      Tenderness: There is abdominal tenderness (mild epigastric). There is no guarding or rebound.   Neurological:      Mental Status: She is alert.   Psychiatric:         Mood and Affect: Mood normal.     EKG interpreted by me: sinus arrhythmia with PVCs, normal axis, normal intervals, no ST segment changes    1437 re-evaluation: fluids are still running, she is resting comfortably. Her color is slightly improved.     Recent Results (from the past 12 hour(s))   ECG 12-Lead Clinic Performed    Collection Time: 03/22/23  1:17 PM   Result Value Ref Range    Ventricular Rate      Atrial Rate      P-R Interval      QRS Duration      Q-T Interval      QTC Calculation (Bezet)      P Axis      R Axis      T Axis     POCT urinalysis dipstick    Collection Time: 03/22/23  3:17 PM   Result Value Ref Range    Glucose Negative Negative    Bilirubin Negative Negative    Ketone Negative Negative    Specific Gravity 1.020 <1.005, 1.005, 1.010, 1.015, 1.020, 1.025, 1.030    Blood Negative Negative    pH 7.0 <5.0, 5.0, 5.5, 6.0, 6.5, 7.0, 7.5, 8.0, 8.5    Protein Negative Negative    Urobilinogen 0.2 mg/dL 0.2 mg/dL, 1.0 mg/dL    Nitrite Negative Negative    Leukocyte Negative Negative         A/P: 82 y.o. female with amnestic MCI, dysautonomia here with daughter for labile blood pressure and new, profound fatigue. Orthostatics positive, hydrated with 500 cc NS (has some diastolic dysfunction) with some improvement. Check labs to evaluate for underlying cause. Given new abdominal pain, recommend completing PET CT previously ordered by oncologist (stat CT abd/pelvis ordered in case of scheduling delays).     Diagnoses and all orders for this visit:    Orthostatic hypotension  -     sodium chloride 0.9% IV soln bolus 500 mL  - encourage PO hydration    Dysautonomia (HCC/RAF)  Labile blood pressure  - continue present management    Unintentional weight loss  -     Fecal Occult Blood  (For evaluation of iron-deficiency anemia and gastrointestinal  bleeding); Future  -     CT abd+pelvis wo+w contrast; Future  -     CBC & Plt & Diff; Future  -     Comprehensive Metabolic Panel; Future  -     Folate,Serum; Future  -     TSH with reflex FT4, FT3; Future  -     Vitamin B12; Future  -     Vitamin D 25-OH; COMMON, deficiency status; Future  -     Hgb A1c; Future  -     Iron & Iron Binding Capacity; Future  -     Ferritin; Future  -     CBC & Plt & Diff; Future    Dysuria  -     POCT urinalysis dipstick; Future    Other fatigue  -     Fecal Occult Blood  (For evaluation of iron-deficiency anemia and gastrointestinal bleeding); Future  -     ECG 12-Lead Clinic Performed  - additional labs as above    Amnestic MCI (mild cognitive impairment with memory loss)  -     Referral to Speech Pathology    Other malignant neuroendocrine tumors (HCC/RAF)  -     CT abd+pelvis wo+w contrast; Future    Balance problem  -     Referral to [facility], Physical Therapy    Chronic left-sided low back pain without sciatica  -     Referral to [facility], Physical Therapy      Return if symptoms worsen or fail to improve.    The above plan of care, diagnosis, orders, and follow-up were discussed with the patient/caregiver. All questions related to this recommended plan of care were answered.    Janeice Robinson, MD  Family Medicine

## 2023-03-22 NOTE — Telephone Encounter
Called patient and scheduled appt with Dr. Lawana Pai.

## 2023-03-25 ENCOUNTER — Ambulatory Visit: Payer: TRICARE (CHAMPUS)

## 2023-03-26 NOTE — Telephone Encounter
MD Order faxed to:   [name] [place]  P. [phone]  F. [phone]     Confirmed receipt of fax.

## 2023-03-27 ENCOUNTER — Ambulatory Visit: Payer: TRICARE (CHAMPUS)

## 2023-03-28 ENCOUNTER — Telehealth: Payer: TRICARE (CHAMPUS)

## 2023-03-28 NOTE — Telephone Encounter
Called [name] and advised of MD message below, they expressed understanding.

## 2023-03-28 NOTE — Telephone Encounter
Patty from [facility] returned my call and she advised:  Diarrhea has subsided  Denied additional symptoms  She is still requesting PRN for anti-diarrhea order in the event diarrhea resumes      She also advised:  Pt's husband reported pt had a fall at 9am today,03/28/23.  Pt has a small scratch on her back  Denied other injuries and pain

## 2023-03-28 NOTE — Telephone Encounter
We received an incoming fax from pt's ALF, [name], advising pt is experiencing diarrhea  Called [name] to ask additional questions  When did diarrhea first occur?  How many bowel movements in the past 24 hours?  Has the pt been drinking water?  Has the pt changed their diet recently?  PCC: If [name] CB, please transfer to me, thank you!

## 2023-03-29 ENCOUNTER — Telehealth: Payer: TRICARE (CHAMPUS)

## 2023-03-29 NOTE — Telephone Encounter
I refaxed the signed form with the instructions for amlodipine.

## 2023-03-29 NOTE — Telephone Encounter
Good Afternoon,     Message to Practice/Provider      Message:   Per [name] needs to have AMLODOPINE directions to be sent and they are unable to find signed orders. Please Fax: [phone]. Please assist. Please advise.     Thank you    Return call is not being requested by the patient or caller.    Patient or caller has been notified of the turnaround time of 1-2 business day(s).

## 2023-04-01 ENCOUNTER — Ambulatory Visit: Payer: TRICARE (CHAMPUS) | Attending: Neurology

## 2023-04-01 ENCOUNTER — Telehealth: Payer: TRICARE (CHAMPUS)

## 2023-04-01 DIAGNOSIS — F0390 Unspecified dementia without behavioral disturbance: Secondary | ICD-10-CM

## 2023-04-01 DIAGNOSIS — Z8679 Personal history of other diseases of the circulatory system: Secondary | ICD-10-CM

## 2023-04-01 DIAGNOSIS — G309 Alzheimer's disease, unspecified: Secondary | ICD-10-CM

## 2023-04-01 DIAGNOSIS — F0280 Dementia in other diseases classified elsewhere without behavioral disturbance: Secondary | ICD-10-CM

## 2023-04-01 NOTE — Progress Notes
[facility]     PATIENT:  Jodi Bentley  MRN: [id number]  DOB:  [birth year]  DATE OF SERVICE: 04/01/2023    PRIMARY CARE PROVIDER: Sheilah Mins, Cyndia Skeeters., MD    REASON FOR VISIT: follow-up for the evaluation of major nuerocognitive disorder    History of Present Illness   Jodi Bentley is a 82 y.o. woman with progressive cognitive decline here today fro further evaluation together with her husband and daughter.    [name] is able to appreciate that things aren't quite where they should be butt does also not seem to appreciate her difficulties to the level that other's around her are. Her concept of the duration of any deficits is also quite off though she was very accurately able to calculate on the spot how long ago it was she retired from work. Exhibited on a few occasions at today's visit loss of train of thought and had some difficulty accessing certain words during conversation.     2020 first note made of some memory concerns in physician notes. Husband was noting some mild memory changes at that time. Also had some falls. Had a fall in autumn 2021 with small SDH. In the wake of that daughter feels there was more notable changes. In March 2022 after further work-up given a diagnosis of MCI 2022.   Has continued to follow with geriatrics physician with repeat MOCA assessments since then. Last score by report was 20/30 (on 10/17/22)     In March 45409 had a large volume LP NPH assessment. 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     No notable alcohol history. No additional noted head trauma history. No known toxin exposures.     Takes Rivastigmine 3mg  BID  Takes 300mg  of Venlafaxine daily plus 15mg  of Buspirone    One of her first cousins with diagnosis of Alzheimer's disease. In retrospect Richard recalls some memory changes in [name]'s mother. She had notable kyphosis which impacted breathing some also.     Interval History 01/14/23:  Larey Seat at the end of Feb and had a femoral fracture (tripped over her pant leg). Still in rehab - has been for the past 3 weeks. Is making progress as per her family.   FDG PET brain scan supporting an Alzheimer's etiology. We spoke about the previous findings on MRI  - that of hemorrhage and the history of SDH in 2021. Interestingly 2022 imaging report without mention of any old blood. Question of interval bleed. Daughter will get image from [facility] and we can have radiology compare to the current imaging. If new that may be further barrier to [place] if all dates back to 2021 then may still consider and could pursue amyloid PET scan.     Interval History 04/01/23:  Had been at a skilled nursing rehab from 3/3-4/29 following her surgery   On 5/3, she and her husband moved into an apartment on the early-Alzheimer's floor at [facility] (their retirement community). She is now under their med management program, and she's getting some in-home care. They also offer daily programming and more staff support. I was informed she is not aware that this is a memory care unit   [name] is appreciating some of her difficulties and made a statement that she feels she might have Alzheimer's disease in her future. We spoke again about the  PET result and spent a lot of time talking about Lecanemab.    Past Medical History  Past Medical History:   Diagnosis Date    Histoplasmosis      Medications   Outpatient Medications Prior to Visit   Medication Sig    amLODIPine 5 mg tablet Take 5 mg as needed for systolic blood pressure above 130 mmHg. If still elevated two hours later, may take an additional 5 mg. Max dose 10 mg/24 hours..    bisacodyl (DULCOLAX) 5 mg EC tablet Take 1 tablet (5 mg total) by mouth daily as needed for Constipation.    busPIRone 15 mg tablet Take 1 tablet (15 mg total) by mouth three (3) times daily. (Patient taking differently: Take 3 tablets (45 mg total) by mouth three (3) times daily.)    Cholecalciferol (VITAMIN D3) 10 mcg (400 units) CAPS Take by mouth daily.    cyanocobalamin 1000 mcg tablet Take 1 tablet (1,000 mcg total) by mouth daily.    LORATADINE 10 mg tablet TAKE 1 TABLET DAILY    pantoprazole 40 mg DR tablet Take 1 tablet (40 mg total) by mouth daily.    polyethylene glycol powder packet Take 1 packet (17 g total) by mouth daily.    rivastigmine 4.5 mg capsule Take 1 capsule (4.5 mg total) by mouth two (2) times daily before meals.    venlafaxine 150 mg 24 hr capsule Take 2 capsules (300 mg total) by mouth daily.    triamcinolone 0.1% cream  (Patient not taking: Reported on 04/01/2023.)     No facility-administered medications prior to visit.     Allergies:   Allergies   Allergen Reactions    Ace Inhibitors Cough     Other reaction(s): Other (See Comments)  cough  cough  cough      Sulfasalazine     Sulfa Antibiotics Rash and Other (See Comments)       Family History:    No family history on file.    Social History:   Social History     Tobacco Use    Smoking status: Former     Types: Cigarettes    Smokeless tobacco: Never        Review of Systems:  Review of systems completed    Objective:     Last Recorded Vital Signs:    04/01/23 1522   BP: 125/78   Pulse: 76     General Examination (observation):  Head: NCAT   Eyes: EOMI  Respiratory: No noted respiratory distress    Mental Status: alert, cooperative. Overall seemed to follow the flow and conversation of the visit well.    MOCA: 20/30 (from other note 10/17/22)  Deficits: unable to copy 3D shape, 2/3 on serial 7 subtraction, 0/1 language fluency (able to name 7 words), 0/5 delayed recall (with MIS 8/15), and 4/6 orientation (thought date was 26th instead of 27th, did not know day of week)    Cranial Nerves:  III, IV, VI-conjugate gaze  VII-facial expression symmetric.    VIII-hearing intact.   IX, X-no dysarthria.     Motor: moves all 4 equally  Gait: is using a walker     Lab Review: reviewed  B12 1216  TSH 5  Vit D and folate wnls    Imaging:   FDG PET Brain 12/13/22:  1. Abnormal distribution of FDG, most severely affecting uptake of bilateral posterior cingulate and parietotemporal cortices. This pattern would be most consistent with a progressive neuro-degenerative disorder,  such as occurs in AD.     2. Structural changes demonstrating cerebral atrophy    MRI Brain 03/13/22:  1. No acute infarct or hemorrhage.  2. Lateral ventricles are slightly dilated out of proportion to the degree of atrophy, correlate for normal pressure hydrocephalus.  3. Hemosiderin staining along the right frontal lobe and smaller amount along left frontal lobe, likely from prior hemorrhage.  4. Generalized cerebral atrophy with asymmetrically increased atrophy of the parietal lobes.    MRI Brain 01/25/21:  There is generalized atrophy with enlargement of ventricles and   sulci. No acute hemorrhage, abnormal mass effect, restricted diffusion, or   other evidence of acute infarct is identified. The craniocervical junction   is unremarkable. There are no subdural collections. There is no evidence of   significant hemosiderin deposition.     NeuroQuant demonstrates hippocampal occupancy score below the 5th   percentile. Hippocampi volume is between the fifth and 25th percentiles.   Superior and inferior lateral ventricular volume are each well above the   95th percentile for age. Complete NeuroQuant data is available in PACS.     IMPRESSION: Cerebral atrophy without evidence of acute intracranial   abnormality. Although there is prominent dilation of lateral ventricles,   overall features do not favor NPH.   IMPRESSION:  Cerebral atrophy without evidence of acute intracranial   abnormality. Although there is prominent dilation of lateral ventricles,   overall features do not favor NPH.   Assessment:   Tephanie Arch is a 82 y.o. woman with progressive cognitive decline here today fro further evaluation together with her husband and daughter.    [name] is able to appreciate that things aren't quite where they should be butt does also not seem to appreciate her difficulties to the level that other's around her are. Her concept of the duration of any deficits is also quite off though she was very accurately able to calculate on the spot how long ago it was she retired from work. Exhibited on a few occasions at today's visit loss of train of thought and had some difficulty accessing certain words during conversation.     2020 first note made of some memory concerns in physician notes. Husband was noting some mild memory changes at that time. Also had some falls. Had a fall in autumn 2021 with small SDH. In the wake of that daughter feels there was more notable changes. In March 2022 after further work-up given a diagnosis of MCI 2022.     Has continued to follow with geriatrics physician with repeat MOCA assessments since then. Last score by report was 20/30 (on 10/17/22)     In March 96295 had a large volume LP NPH assessment. 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     No notable alcohol history. No additional noted head trauma history. No known toxin exposures.     Takes Rivastigmine 4.5mg  BID.  Takes 300mg  of Venlafaxine daily plus 15mg  of Buspirone. We spoke about this dosing and if is good to know that she has a psychiatrist DR Areta Haber managing/monitoring these meds.     One of her first cousins with diagnosis of Alzheimer's disease. In retrospect Richard recalls some memory changes in Izora's mother. She had notable kyphosis which impacted breathing some also.     FDG PET brain scan supporting an Alzheimer's etiology.  Had been at a skilled nursing rehab from 3/3-4/29 following her surgery for femoral fracture.  On 5/3, she and her husband moved into an apartment on the early-Alzheimer's floor at [facility] (their retirement community). She is now under their med management program, and she's getting some in-home care. They also offer daily programming and more staff support. I was informed she is not aware that this is a memory care unit   Rayn is appreciating some of her difficulties and made a statement that she feels she might have Alzheimer's disease in her future. We spoke again about the PET result and spent a lot of time talking about Lecanemab.    We spoke about the previous findings on MRI  - that of hemorrhage and the history of SDH in 2021. Interestingly 2022 imaging report without mention of any old blood. Will check with reading Radiologist.    Plan for updated [facility] assessment and possibly for amyloid PET imaging.     Plan/ Recommendation:   Rivastigmine 4.5mg  BID  Spoke about exercise (cognitive and physical)  Will consider amyloid PET brain scan  RTC for MOCA testing. RTC otherwise in 4-6 months    Author:  Coralee North. Vivi Ferns, MD 04/01/2023

## 2023-04-01 NOTE — Telephone Encounter
We received an incoming fax from pt's ALF, [name] advising:   Pt was experiencing diarrhea and constipation  I called and spoke to pt's nurse, she advised:  Pt is not experiencing diarrhea or constipation anymore

## 2023-04-11 ENCOUNTER — Inpatient Hospital Stay: Payer: TRICARE (CHAMPUS) | Attending: Gastroenterology

## 2023-04-11 DIAGNOSIS — C7A8 Other malignant neuroendocrine tumors: Secondary | ICD-10-CM

## 2023-04-11 MED ADMIN — IOHEXOL 350 MG/ML IV SOLN: 100 mL | INTRAVENOUS | @ 22:00:00 | Stop: 2023-04-11 | NDC 00407141491

## 2023-04-11 MED ADMIN — COPPER CU 64 DOTATATE 1 MCI/ML IV SOLN: 4.08 | INTRAVENOUS | @ 21:00:00 | Stop: 2023-04-11 | NDC 69945006401

## 2023-04-15 ENCOUNTER — Telehealth: Payer: TRICARE (CHAMPUS)

## 2023-04-15 NOTE — Telephone Encounter
Hello    Called patient to schedule a return visit with MD, patient did not answer lvm for patient to call back and schedule. Upon CB anyone may assist with scheduling.    Thank you     Robert-

## 2023-04-16 ENCOUNTER — Ambulatory Visit: Payer: TRICARE (CHAMPUS)

## 2023-04-17 ENCOUNTER — Ambulatory Visit: Payer: TRICARE (CHAMPUS)

## 2023-04-18 ENCOUNTER — Telehealth: Payer: TRICARE (CHAMPUS)

## 2023-04-18 ENCOUNTER — Ambulatory Visit: Payer: TRICARE (CHAMPUS)

## 2023-04-18 DIAGNOSIS — H6121 Impacted cerumen, right ear: Secondary | ICD-10-CM

## 2023-04-18 NOTE — Progress Notes
[facility]  VISIT DATE: 04/18/2023    MRN: [id number]  DOB: [birth year]     SUBJECTIVE   Jodi Bentley is a 82 y.o. female had concerns including Ear Fullness (Right ear ).    W/ Hx of right ear cerumen impaction.    Past Medical History:   Diagnosis Date    Histoplasmosis      Current Outpatient Medications   Medication Sig    amLODIPine 5 mg tablet Take 5 mg as needed for systolic blood pressure above 454 mmHg. If still elevated two hours later, may take an additional 5 mg. Max dose 10 mg/24 hours..    bisacodyl (DULCOLAX) 5 mg EC tablet Take 1 tablet (5 mg total) by mouth daily as needed for Constipation.    busPIRone 15 mg tablet Take 1 tablet (15 mg total) by mouth three (3) times daily. (Patient taking differently: Take 3 tablets (45 mg total) by mouth three (3) times daily.)    Cholecalciferol (VITAMIN D3) 10 mcg (400 units) CAPS Take by mouth daily.    cyanocobalamin 1000 mcg tablet Take 1 tablet (1,000 mcg total) by mouth daily.    LORATADINE 10 mg tablet TAKE 1 TABLET DAILY    pantoprazole 40 mg DR tablet Take 1 tablet (40 mg total) by mouth daily.    rivastigmine 4.5 mg capsule Take 1 capsule (4.5 mg total) by mouth two (2) times daily before meals.    venlafaxine 150 mg 24 hr capsule Take 2 capsules (300 mg total) by mouth daily.    polyethylene glycol powder packet Take 1 packet (17 g total) by mouth daily.    triamcinolone 0.1% cream  (Patient not taking: Reported on 04/01/2023.)     No current facility-administered medications for this visit.     Facility-Administered Medications Ordered in Other Visits   Medication Dose Route Frequency    [COMPLETED] Copper Cu-64 dotatate (Detectnet) inj 4.08 millicurie  4.08 millicurie Intravenous Once    [COMPLETED] iohexol (Omnipaque) 350 mg/mL inj 100 mL  100 mL Intravenous Once       ROS: 14 points ROS non-contributory/neg except noted as above.  PMH Reviewed  PSH Reviewed  ALLX Reviewed  MEDS Reviewed    OBJECTIVE   VS: BP 121/75  ~ Pulse 73  ~ Temp 36.7 ?C (98.1 ?F) (Tympanic)  ~ Resp 16  ~ Wt 152 lb (68.9 kg)  ~ SpO2 96%  ~ BMI 23.81 kg/m?     Physical Exam:  GENERAL: NAD, comfortable  NEURO: alert & oriented  ENT: EOMI, right ear cerumen impaction. Left TMI.  CV: pulses intact  RESP: normal respiratory effort  PSYCH: normal mood and affect    Notes Reviewed:   Labs Reviewed:  Imaging Reviewed:   ASSESSMENT & PLAN     1. Impacted cerumen of right ear  - TBOC - Ear Wax Removal, Right    Cerumen Impaction  Verbal consent obtained.  Cerumen was removed carefully with lavage of the right ear.  Patient tolerated procedure well.    The above plan of care, diagnosis, orders, and follow-up were discussed with the patient, and the patient demonstrated full understanding. Questions related to this recommended plan of care were answered.    --  Jaclynn Major, DO  Family Medicine and Sports Medicine  [facility]

## 2023-04-18 NOTE — Telephone Encounter
Call Back Request      Reason for call back: Pt's daughter [name] scheduled pt's follow up 04/22/23 to review 04/14/23 PETCT results, she is asking if this can be a video visit or does the pt need to come in for results.     P: [phone] [name]     Any Symptoms:  []  Yes  [x]  No      If yes, what symptoms are you experiencing:    Duration of symptoms (how long):    Have you taken medication for symptoms (OTC or Rx):      If call was taken outside of clinic hours:    [] Patient or caller has been notified that this message was sent outside of normal clinic hours.     [] Patient or caller has been warm transferred to the physician's answering service. If applicable, patient or caller informed to please call us back if symptoms progress.  Patient or caller has been notified of the turnaround time of 1-2 business day(s).

## 2023-04-18 NOTE — Telephone Encounter
Hello,    Called patient to schedule a return visit to review scans, patient did not answer and phone just rang and hung up not able to leave a message. Sent patient a mychart message to call back and schedule.    Thank you     Molly Maduro-

## 2023-04-19 NOTE — Telephone Encounter
Hello     Called patient in regards to changing 07/01 RV to a video as Dr. Michele Mcalpine does not have any openings that week and not until 05/02/23 soonest. Patient did not answer nor did patients daughter [name] as [name] asked me to call her. Lvm with [name].    Thank you     Molly Maduro

## 2023-04-22 ENCOUNTER — Ambulatory Visit: Payer: TRICARE (CHAMPUS) | Attending: Gastroenterology

## 2023-04-22 DIAGNOSIS — D3A8 Other benign neuroendocrine tumors: Secondary | ICD-10-CM

## 2023-04-22 NOTE — Patient Instructions
Return in 3 months after labs and scans.

## 2023-04-22 NOTE — Progress Notes
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    HISTORY OF PRESENT ILLNESS:  Patient is an 82 year old female who has moved from [place] to [place] and is here for of followup. The patient initially presented with abdominal pain and weight loss. She was evaluated and found to have a tumor in the small bowel. She underwent resection on 11/07/2021, was found to have a grade 2 small bowel neuroendocrine tumor. Ki-67 3% to 20%; it was T4 N1, 2 out of 31 lymph nodes were positive. There was no lymphovascular, but there was perineural invasion. There was tumor at the radial or mesenteric margin. The patient has done well postoperatively. 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 She now presents for further evaluation and treatment options. She had an original CT, which showed the mesenteric mass with mesenteric tethering as far as I can tell, has not had since then.    04/22/23 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 Recommend correlation with prior imaging or MR abdomen with hepatobiliary agent (Eovist). No flushing and diarrhea. Negative FOBT.       Subjective:       Past Medical History:   Diagnosis Date    Histoplasmosis      No past surgical history on file.  Social History     Socioeconomic History    Marital status: Married   Tobacco Use    Smoking status: Former     Types: Cigarettes    Smokeless tobacco: Never     Social Determinants of Psychologist, prison and probation services Strain: Low Risk  (12/23/2022)    Received from [facility] [place], [address] [facility] [place]    Overall Physicist, medical Strain (CARDIA)     Difficulty of Paying Living Expenses: Not very hard     No family history on file.  Current Outpatient Medications   Medication Sig    amLODIPine 5 mg tablet Take 5 mg as needed for systolic blood pressure above 562 mmHg. If still elevated two hours later, may take an additional 5 mg. Max dose 10 mg/24 hours..    bisacodyl (DULCOLAX) 5 mg EC tablet Take 1 tablet (5 mg total) by mouth daily as needed for Constipation.    busPIRone 15 mg tablet Take 1 tablet (15 mg total) by mouth three (3) times daily. (Patient taking differently: Take 3 tablets (45 mg total) by mouth three (3) times daily.)    Cholecalciferol (VITAMIN D3) 10 mcg (400 units) CAPS Take by mouth daily.    cyanocobalamin 1000 mcg tablet Take 1 tablet (1,000 mcg total) by mouth daily.    LORATADINE 10 mg tablet TAKE 1 TABLET DAILY    pantoprazole 40 mg DR tablet Take 1 tablet (40 mg total) by mouth daily.    polyethylene glycol powder packet Take 1 packet (17 g total) by mouth daily.    rivastigmine 4.5 mg capsule Take 1 capsule (4.5 mg total) by mouth two (2) times daily before meals.    triamcinolone 0.1% cream  (Patient not taking: Reported  on 04/01/2023.)    venlafaxine 150 mg 24 hr capsule Take 2 capsules (300 mg total) by mouth daily.     No current facility-administered medications for this visit.      Allergies   Allergen Reactions    Ace Inhibitors Cough     Other reaction(s): Other (See Comments)  cough  cough  cough      Sulfasalazine     Sulfa Antibiotics Rash and Other (See Comments)     Wt Readings from Last 3 Encounters:   04/22/23 69.9 kg (154 lb 3.2 oz)   04/18/23 68.9 kg (152 lb)   03/22/23 68.9 kg (152 lb)       Review of Systems  Constitutional: negative for anorexia, fatigue and weight loss, level of energy  Eyes: negative for pain, diplopia, dryness  Ears, nose, mouth, throat, and face: negative for ear drainage, epistaxis, hearing loss, hoarseness, nasal congestion, sore mouth and sore throat  Respiratory: negative for cough, dyspnea on exertion and sputum  Cardiovascular: negative for chest pain, exertional chest pressure/discomfort and palpitations  Gastrointestinal: negative for abdominal pain, constipation, diarrhea, dysphagia, nausea, reflux symptoms and vomiting  Integument: negative for dryness, pruritus, rash, skin color change and skin lesion(s)  Hematologic/lymphatic: negative for bleeding, easy bruising, lymphadenopathy and petechiae  Musculoskeletal:negative for arthralgias, muscle weakness and myalgias  Neurological: negative for headaches, memory problems and speech problems  Behavioral/Psych: negative for anxiety and depression  Endocrine: negative for temperature intolerance  Allergic/Immunologic:   Allergies   Allergen Reactions    Ace Inhibitors Cough     Other reaction(s): Other (See Comments)  cough  cough  cough      Sulfasalazine     Sulfa Antibiotics Rash and Other (See Comments)          Objective:        Vitals:BP 168/81  ~ Pulse 78  ~ Temp 36.7 ?C (98.1 ?F) (Forehead)  ~ Resp 18  ~ Ht 165.1 cm (5' 5'')  ~ Wt 69.9 kg (154 lb 3.2 oz)  ~ SpO2 98%  ~ BMI 25.66 kg/m?      Wt Readings from Last 1 Encounters:   04/22/23 69.9 kg (154 lb 3.2 oz)         General: Appears well-developed, well-nourished and close to stated age.   Head: Normocephalic, atraumatic.  Eyes: PERRL without icterus.   ENT: Oropharynx is clear, mucus membranes are moist.  No oral ulcers noted. Good dentition.  Neck: Supple. Trachea midline. No lymphadenopathy  CV: Regular in rate and rhythm, no murmurs or gallops. PMI nondisplaced.   Chest: Clear to auscultation bilaterally without wheezing or rhonchi. No crackles noted. Respiratory effort appears normal.   Abdomen: Soft, nontender and nondistended. Bowel sounds are present and normoactive. No organomegaly is appreciated  Musculoskeletal: No edema. No cyanosis. Extremities are warm and well-perfused. Neurologic: Gait appears normal. Oriented to person and place.  Hematologic: No bruising, purpura or petechiae are noted.   Dermatologic: No rashes appreciated. No tumors noted.  Lymphatic: No palpable cervical, supraclavicular, axillary or inguinal adenopathy appreciated.   Psychiatric: Affect appropriate.  Pleasant and conversant.                  ECOG:2    Lab Review:    Results for orders placed or performed in visit on 03/22/23   CBC   Result Value Ref Range    White Blood Cell Count 10.39 (H) 4.16 - 9.95 x10E3/uL    Red Blood Cell Count  4.46 3.96 - 5.09 x10E6/uL    Hemoglobin 13.2 11.6 - 15.2 g/dL    Hematocrit 16.1 09.6 - 45.2 %    Mean Corpuscular Volume 91.5 79.3 - 98.6 fL    Mean Corpuscular Hemoglobin 29.6 26.4 - 33.4 pg    MCH Concentration 32.4 31.5 - 35.5 g/dL    Red Cell Distribution Width-SD 49.1 (H) 36.9 - 48.3 fL    Red Cell Distribution Width-CV 14.6 11.1 - 15.5 %    Platelet Count, Auto 261 143 - 398 x10E3/uL    Mean Platelet Volume 10.4 9.3 - 13.0 fL    Nucleated RBC%, automated 0.0 No Ref. Range %    Absolute Nucleated RBC Count 0.00 0.00 - 0.00 x10E3/uL    Neutrophil Abs (Prelim) 6.99 See Absolute Neut Ct. x10E3/uL   Differential, Automated   Result Value Ref Range    Neutrophil Percent, Auto 67.3 No Ref. Range %    Lymphocyte Percent, Auto 20.2 No Ref. Range %    Monocyte Percent, Auto 6.7 No Ref. Range %    Eosinophil Percent, Auto 4.6 No Ref. Range %    Basophil Percent, Auto 0.7 No Ref. Range %    Immature Granulocytes% 0.5 No Reference Range %    Absolute Neut Count 6.99 (H) 1.80 - 6.90 x10E3/uL    Absolute Lymphocyte Count 2.10 1.30 - 3.40 x10E3/uL    Absolute Mono Count 0.70 0.20 - 0.80 x10E3/uL    Absolute Eos Count 0.48 0.00 - 0.50 x10E3/uL    Absolute Baso Count 0.07 0.00 - 0.10 x10E3/uL    Absolute Immature Gran Count 0.05 (H) 0.00 - 0.04 x10E3/uL   CBC & Plt & Diff    Narrative    The following orders were created for panel order CBC & Plt & Diff.  Procedure Abnormality         Status                     ---------                               -----------         ------                     EAV[[id number]]                          Abnormal            Final result               Differential, Automated[[id number]]      Abnormal            Final result                 Please view results for these tests on the individual orders.     Results for orders placed or performed in visit on 03/22/23   Comprehensive Metabolic Panel   Result Value Ref Range    Sodium 138 135 - 146 mmol/L    Potassium 3.9 3.6 - 5.3 mmol/L    Chloride 100 96 - 106 mmol/L    Total CO2 25 20 - 30 mmol/L    Anion Gap 13 8 - 19 mmol/L    Glucose 89 65 - 99 mg/dL    Creatinine 7.82 9.56 - 1.30 mg/dL    Estimated GFR 66 See GFR  Additional Information mL/min/1.51m2    GFR Additional Information See Comment     Urea Nitrogen 12 7 - 22 mg/dL    Calcium 9.2 8.6 - 16.1 mg/dL    Total Protein 6.8 6.1 - 8.2 g/dL    Albumin 4.1 3.9 - 5.0 g/dL    Bilirubin,Total 0.3 0.1 - 1.2 mg/dL    Alkaline Phosphatase 113 37 - 113 U/L    Aspartate Aminotransferase 26 13 - 62 U/L    Alanine Aminotransferase 11 8 - 70 U/L     No results found for: ''CA125''  No results found for: ''CEAG'', ''CA199''         Other Tests Reviewed:  IMAGING:    PETCT with Diagnostic CT of the neck-chest-abd-pelvis w/IV contrast; Somatostatin receptor imaging (NETSPOT, DETECTNET)    Result Date: 04/14/2023  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 Recommend correlation with prior imaging or MR abdomen with hepatobiliary agent (Eovist). [id number] Thank you for allowing [facility] to assist in the care of this patient. This study has been reviewed jointly by Radiologist Otelia Santee, M.D. and Nuclear Radiologist Regina Eck, M.D. The above constitutes a synthesis of both the CT and PET findings and the physicians have reviewed and are in agreement with the report. 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 Discussed with family. Will repeat in 3 months.     Return in about 3 months (around 07/23/2023).    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     Author: Tye Maryland. Michele Mcalpine, MD

## 2023-04-23 ENCOUNTER — Telehealth: Payer: TRICARE (CHAMPUS)

## 2023-04-23 NOTE — Telephone Encounter
Referral Request    1) Patient is requesting a referral to:  PET CT     Specific location? [employer]   Particular MD in mind?     2) The issue (diagnosis, symptoms): patient saw Dr. Michele Mcalpine and advised he will place order     3) Has patient been seen by their doctor for this issue? yes     4) Was an appointment offered? no    5) Patient's last office visit:  04/22/2023    Patient or caller has been notified of the turnaround time of 1-2 business day(s).

## 2023-04-23 NOTE — Addendum Note
Addended by: Sarina Ser on: 04/23/2023 04:27 PM     Modules accepted: Orders

## 2023-05-02 ENCOUNTER — Ambulatory Visit: Payer: MEDICARE

## 2023-05-03 ENCOUNTER — Ambulatory Visit: Payer: TRICARE (CHAMPUS)

## 2023-05-19 ENCOUNTER — Telehealth: Payer: TRICARE (CHAMPUS)

## 2023-05-19 NOTE — Telephone Encounter
Faxed [facility] medical records request. Thank you

## 2023-05-19 NOTE — Telephone Encounter
Left message to call back to schedule a F/U appointment from ER visit. Please advise thank you

## 2023-05-19 NOTE — Telephone Encounter
Faxed back signed copy to [name]. Thank you

## 2023-05-31 ENCOUNTER — Telehealth: Payer: TRICARE (CHAMPUS)

## 2023-05-31 NOTE — Telephone Encounter
Message to Practice/Provider      Message: Jonetta Speak calling from [name] to advise patients husband is stating patient started urinating blood today, patient had a UTI two weeks ago and patient was prescribed antibiotics, it looks like UTI is back. Luis stating he already collected urine sample he needs UA order for them to process the sample.   Asking to please fax urinalysis order to FAX # [phone]    Patient or caller has been notified that their message will be reviewed within 1-2 business days.

## 2023-06-01 ENCOUNTER — Ambulatory Visit: Payer: MEDICARE

## 2023-06-01 ENCOUNTER — Telehealth: Payer: MEDICARE

## 2023-06-01 DIAGNOSIS — N390 Urinary tract infection, site not specified: Secondary | ICD-10-CM

## 2023-06-01 MED ORDER — CIPROFLOXACIN HCL 250 MG PO TABS
250 mg | ORAL_TABLET | Freq: Two times a day (BID) | ORAL | 0 refills | Status: AC
Start: 2023-06-01 — End: 2023-06-02

## 2023-06-01 MED ORDER — CIPROFLOXACIN HCL 250 MG PO TABS
250 mg | ORAL_TABLET | Freq: Two times a day (BID) | ORAL | 0 refills | Status: AC
Start: 2023-06-01 — End: ?

## 2023-06-01 NOTE — Telephone Encounter
LOV 04/18/23  Hello Dr. Lawana Pai,     Please see below and advise. Thank you.

## 2023-06-01 NOTE — Progress Notes
Chief Complaint   Patient presents with    Urinary Tract Infection       SUBJECTIVE:  Jodi Bentley is a 82 y.o. female history of dementia, impaired mobility who presents for   Chief Complaint   Patient presents with    Urinary Tract Infection     Burning with urination for the past 5 days with chills and lower abdominal pressure.  History provided by the husband as the patient is not able to given her severe dementia.    ROS  No flank pain, hematuria, abdominal pain, fever, vomiting.       Past Medical History:   Diagnosis Date    Histoplasmosis        Patient Active Problem List   Diagnosis    History of fall    Amnestic MCI (mild cognitive impairment with memory loss)    Other malignant neuroendocrine tumors (HCC/RAF)    Balance problem    Impaired mobility    Severe hearing loss    Unspecified mood (affective) disorder (HCC/RAF)    GAD (generalized anxiety disorder)    Mild major depression (HCC/RAF)    OSA (obstructive sleep apnea)    Dysautonomia (HCC/RAF)    Dizziness    Abnormal brain MRI         Current Outpatient Medications:     amLODIPine 5 mg tablet, Take 5 mg as needed for systolic blood pressure above 409 mmHg. If still elevated two hours later, may take an additional 5 mg. Max dose 10 mg/24 hours.., Disp: 30 tablet, Rfl: 0    bisacodyl (DULCOLAX) 5 mg EC tablet, Take 1 tablet (5 mg total) by mouth daily as needed for Constipation., Disp: , Rfl:     busPIRone 15 mg tablet, Take 1 tablet (15 mg total) by mouth three (3) times daily. (Patient taking differently: Take 3 tablets (45 mg total) by mouth three (3) times daily.), Disp: 270 tablet, Rfl: 3    Cholecalciferol (VITAMIN D3) 10 mcg (400 units) CAPS, Take by mouth daily., Disp: , Rfl:     LORATADINE 10 mg tablet, TAKE 1 TABLET DAILY, Disp: 90 tablet, Rfl: 3    pantoprazole 40 mg DR tablet, Take 1 tablet (40 mg total) by mouth daily., Disp: 90 tablet, Rfl: 1    rivastigmine 4.5 mg capsule, Take 1 capsule (4.5 mg total) by mouth two (2) times daily before meals., Disp: 180 capsule, Rfl: 2    venlafaxine 150 mg 24 hr capsule, Take 2 capsules (300 mg total) by mouth daily., Disp: 180 capsule, Rfl: 1    ciprofloxacin 250 mg tablet, Take 1 tablet (250 mg total) by mouth two (2) times daily for 3 days., Disp: 6 tablet, Rfl: 0    OBJECTIVE:  Vitals: BP 102/65  ~ Pulse 86  ~ Temp 36 ?C (96.8 ?F) (Tympanic)  ~ Resp 16  ~ Ht 5' 5'' (1.651 m)  ~ Wt 150 lb (68 kg)  ~ SpO2 93% Comment: room air ~ BMI 24.96 kg/m?     Physical Exam  Vitals reviewed.   Constitutional:       General: She is not in acute distress.     Appearance: Normal appearance. She is not ill-appearing or toxic-appearing.   HENT:      Head: Normocephalic and atraumatic.      Nose: Nose normal.      Mouth/Throat:      Mouth: Mucous membranes are moist.   Eyes:      Conjunctiva/sclera: Conjunctivae normal.  Pulmonary:      Effort: Pulmonary effort is normal.   Neurological:      Mental Status: She is alert. She is disoriented.   Psychiatric:         Mood and Affect: Mood normal.            Labs Reviewed:  Results for orders placed or performed in visit on 06/01/23   POCT urinalysis dipstick   Result Value Ref Range    Glucose Negative Negative    Bilirubin Negative Negative    Ketone Negative Negative    Specific Gravity 1.015 <1.005, 1.005, 1.010, 1.015, 1.020, 1.025, 1.030    Blood Moderate (++) (A) Negative    pH 6.0 <5.0, 5.0, 5.5, 6.0, 6.5, 7.0, 7.5, 8.0, 8.5    Protein 100 mg/dL (++) (A) Negative    Urobilinogen 0.2 mg/dL 0.2 mg/dL, 1.0 mg/dL    Nitrite Positive (A) Negative    Leukocyte Large (+++) (A) Negative           ASSESSMENT/PLAN:  1. Urinary tract infection without hematuria, site unspecified  - POCT urinalysis dipstick; Future  - Bacterial Culture Urine, CLINIC COLLECT TODAY; Future  - POCT urinalysis dipstick  - ciprofloxacin 250 mg tablet; Take 1 tablet (250 mg total) by mouth two (2) times daily for 3 days.  Dispense: 6 tablet; Refill: 0  - Urinalysis Routine; Future  - Urinalysis Routine  - Bacterial Culture Urine, CLINIC COLLECT TODAY    2. Amnestic MCI (mild cognitive impairment with memory loss)    3. Impaired mobility     82 y.o. female history of dementia, impaired mobility who presents for UTI.   Patient's labs reviewed.  Previously was prescribed Keflex for UTI.  No prior urine culture results showing bacteria.  We will start patient on Cipro twice a day for 3 days.  Advised if urine culture shows resistance then will switch the antibiotic.    ER and return precautions discussed    The above plan of care, diagnosis, orders, and follow-up were discussed with the patient/caregiver. All questions related to this recommended plan of care were answered.    Portions of this note may have been created with voice recognition software. Occasional wrong-word or ''sound-alike'' substitutions may have occurred due to the inherent limitations of voice recognition software. Please read the chart carefully and recognize, using context, where these substitutions have occurred.        Eusebio Me, MD  [facility]  06/01/2023

## 2023-06-01 NOTE — Telephone Encounter
Alondra from patient's [facility]F, [name], called in stating that patient was seen today in IC.   Patient was prescribed an Rx, however, they need an order to administer the medication to the patient.     I sent a secure chat to Dr. Eusebio Me, MD.   Doctor created the order, signed it and I faxed it out to [name] at [phone].    Done - No further action required.

## 2023-06-01 NOTE — Patient Instructions
Urinary Tract Infections in Women  Urinary tract infections (UTIs) are most often caused by bacteria. These bacteria enter the urinary tract. The bacteria may come from inside the body. Or they may travel from the skin outside the rectum or vagina into the urethra. Female anatomy makes it easy for bacteria from the bowel to enter a woman?s urinary tract. This is the most common source of UTI. This means women develop UTIs more often than men. Pain in or around the urinary tract is a common UTI symptom.   Most UTIs are treated with antibiotics. These kill the bacteria. The length of time you need to take them depends on the type of infection. It may be as short as 3 days. If you have repeated UTIs, you may need a low-dose antibiotic for several months. Take antibiotics exactly as directed. Don?t stop taking them until all of the medicine is gone. If you stop taking the antibiotic too soon, the infection may not go away. You may also develop a resistance to the antibiotic. This can make it much harder to treat in the future.   Gender words are used here to talk about anatomy and health risk. Please use this information in a way that works best for you and your provider as you talk about your care.   Home care  The lifestyle changes below will help get rid of your UTI. They may also help prevent future UTIs:   Drink plenty of fluids. This includes water, juice, or other caffeine-free drinks. Fluids help flush bacteria out of your body.  Empty your bladder. Always empty your bladder when you feel the urge to pee. And always pee before going to sleep. Urine that stays in your bladder can lead to infection. Try to pee before and after sex as well.  Practice good personal hygiene. Wipe yourself from front to back after using the toilet. This helps keep bacteria from getting into the urethra.  Use condoms during sex. These help prevent UTIs caused by sexually transmitted bacteria. Also don't use spermicides during sex. These can increase the risk for UTIs. Choose other forms of birth control instead. For women who tend to get UTIs after sex, a low dose of a preventive antibiotic may be used. Be sure to discuss this choice with your healthcare provider.  Try holistic supplements, such as cranberry tablets and D-mannose. These may help prevent UTIs.  Try topical vaginal estrogen. You can use this to help prevent UTIs if you have gone through menopause.  Follow-up care  Follow up with your healthcare provider as directed. They may test to make sure the infection has cleared. If needed, more treatment may be started.   When to get medical advice  Call your healthcare provider right away if any of the following occur:   Frequent urination  Pain or burning when passing urine  Fever of 100.4?F (38?C) or higher, or as directed by your healthcare provider  Urine looks dark, cloudy, or reddish in color. This may mean that blood is in the urine.  Urine smells bad  Feeling pain even when not urinating  Tiredness  Pain in the belly (abdomen) area below the bellybutton, or in the back or side, below the ribs  Nausea or vomiting  Have a strong urge to urinate, but only a small amount of urine is passed  Uncomfortable pressure above the pubic bone  Feeling confused or very tired (in older adults)  [employer] last reviewed this educational content on 09/21/2021  ?   2000-2023 [employer]. All rights reserved. This information is not intended as a substitute for professional medical care. Always follow your healthcare professional's instructions.

## 2023-06-01 NOTE — Addendum Note
Addended by: Eusebio Me on: 06/01/2023 12:30 PM     Modules accepted: Orders

## 2023-06-02 LAB — UA,Microscopic: WBCS: >1000 {cells}/uL — ABNORMAL HIGH (ref 0–22)

## 2023-06-02 LAB — Bacterial Culture Urine: BACTERIAL CULTURE URINE: >100000 — AB

## 2023-06-02 LAB — UA,Dipstick

## 2023-06-03 ENCOUNTER — Ambulatory Visit: Payer: TRICARE (CHAMPUS)

## 2023-06-03 DIAGNOSIS — N390 Urinary tract infection, site not specified: Secondary | ICD-10-CM

## 2023-06-03 LAB — Bacterial Culture Urine: BACTERIAL CULTURE URINE: >100000 — AB

## 2023-06-03 MED ORDER — NITROFURANTOIN MONOHYD MACRO 100 MG PO CAPS
100 mg | ORAL_CAPSULE | Freq: Two times a day (BID) | ORAL | 0 refills | 5.00000 days | Status: AC
Start: 2023-06-03 — End: ?

## 2023-06-03 NOTE — Telephone Encounter
Patient has already had IC visit and has been treated.

## 2023-06-05 ENCOUNTER — Telehealth: Payer: MEDICARE

## 2023-06-06 ENCOUNTER — Telehealth: Payer: MEDICARE

## 2023-06-06 NOTE — Telephone Encounter
Faxed back to [facility] as requested. Thank you

## 2023-06-06 NOTE — Telephone Encounter
Appointment Accommodation Request    Please Call Husband Jodi Bentley        Appointment Type: Return     Reason for sooner request: Per Spouse Jodi Bentley received a call that Patient needs to do a 1 week follow up from last visit. Per Spouse is not sure when they called or who called. Per Spouse would like to just schedule a follow up with Jodi Bentley.  I did explain to spouse Jodi Bentley will be on vacation until the 8/20 spouse is okay to wait until then and is okay with that week.   Declined to schedule with another PCP.       Date/Time Requested (If any):  the week of 06/11/23- 06/14/23    Last seen by MD: 03/22/23    Any Symptoms:  []  Yes  [x]  No      If yes, what symptoms are you experiencing:   Duration of symptoms (how long):     Patient or caller was offered an appointment but declined.    Patient or caller was advised to seek emergency services if conditions are urgent or emergent.    Patient or caller has been notified of the turnaround time of 1-2 business (days).

## 2023-06-08 NOTE — Telephone Encounter
Attempted to contact patient but was only able to leave a voicemail. Please help schedule F/U visit. Thank you

## 2023-06-12 ENCOUNTER — Telehealth: Payer: MEDICARE

## 2023-06-12 ENCOUNTER — Ambulatory Visit: Payer: MEDICARE

## 2023-06-12 DIAGNOSIS — R5383 Other fatigue: Secondary | ICD-10-CM

## 2023-06-12 DIAGNOSIS — R350 Frequency of micturition: Secondary | ICD-10-CM

## 2023-06-12 NOTE — Telephone Encounter
Faxed as requested to [name] Att[facility]rie Noe [phone]. Thank you

## 2023-06-12 NOTE — Telephone Encounter
Message to Practice/Provider      Message: Erie Noe from [facility] is asking if we can fax over the plan of care from 04/02/2023. Please fax to [phone]. Attn Erie Noe.    Patient or caller has been notified that their message will be reviewed within 1-2 business days.

## 2023-06-13 ENCOUNTER — Ambulatory Visit: Payer: MEDICARE

## 2023-06-13 NOTE — Progress Notes
[facility]   Progress Note    CC:   Chief Complaint   Patient presents with    Follow-up     3 month        S: Jodi Bentley is a 82 y.o. 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     03/19/23  3 months ago broken left femur  2 months in rehab  Then moved back into independent living at [facility], then moved into assisted living a month ago May 3rd  In the following week, very high blood pressure as high as 200/170s on May 10th  Since May 15th, lower 120-140s/80s  However, did have one low of 88/67 on May 15th.   They have clonidine prn from their former PCP    Left hip pain with sciatica for 5-6 years  Did follow up with surgeon after left hip ORIF  Frustrated she still has sciatic pain when her hip was supposedly fixed  Has not had back imaging  Would be open to [facility]    Has had unintentional weight loss    03/22/23  Here with daughter for fatigue. Sleeping 18 hours per day, which is new since last week. She has done this in the remote past, but not lately. No new medications. Hasn't needed amlodipine. Not eating or drinking that well. Last visit mentioned some unintentional weight loss but did not have time to address it. Some dysuria. Mild lower abdominal pain/cramping with alternating diarrhea and constipation, denies blood. Occasionally dizzy/foggy.      Requesting PT, ST    06/12/23  Feeling very fatigued and weak. Recently treated for UTI with antibiotics x 2, but still feels her urine is not clear. Has fallen. Reports back pain and abdominal pain. Not using amlodipine. Continuing to lose weight despite eating and drinking.     Past Medical History:   Diagnosis Date    Histoplasmosis        ROS  Negative except as above    Medications that the patient states to be currently taking   Medication Sig    bisacodyl (DULCOLAX) 5 mg EC tablet Take 1 tablet (5 mg total) by mouth daily as needed for Constipation.    busPIRone 15 mg tablet Take 1 tablet (15 mg total) by mouth three (3) times daily. (Patient taking differently: Take 3 tablets (45 mg total) by mouth three (3) times daily.)    Cholecalciferol (VITAMIN D3) 10 mcg (400 units) CAPS Take by mouth daily.    LORATADINE 10 mg tablet TAKE 1 TABLET DAILY    pantoprazole 40 mg DR tablet Take 1 tablet (40 mg total) by mouth daily.    rivastigmine 4.5 mg capsule Take 1 capsule (4.5 mg total) by mouth two (2) times daily before meals.    venlafaxine 150 mg 24 hr capsule Take 2 capsules (300 mg total) by mouth daily.       O:   Vitals:    06/12/23 1716 06/12/23 1849   BP: (!) 85/55 109/72   Pulse: 89    Temp: 37 ?C (98.6 ?F)    TempSrc: Tympanic    Weight: 142 lb (64.4 kg)      Wt Readings from Last 3 Encounters:   06/12/23 142 lb (64.4 kg)   06/01/23 150 lb (68 kg)   04/22/23 154 lb 3.2 oz (69.9 kg)       Physical Exam  Constitutional:       General: She is not in acute  distress.     Appearance: She is well-developed.   HENT:      Head: Normocephalic and atraumatic.   Cardiovascular:      Rate and Rhythm: Normal rate. Rhythm irregular.   Pulmonary:      Effort: Pulmonary effort is normal.   Abdominal:      Tenderness: There is abdominal tenderness (mild epigastric). There is no guarding or rebound.   Neurological:      Mental Status: She is alert.   Psychiatric:         Mood and Affect: Mood normal.       Specimen Information: Clean Catch, Midstream; Urine   Bacterial Culture Urine >100,000 CFU/mL Escherichia coli Abnormal    Susceptibility Setup Date: 06/02/2023        Susceptibility     Escherichia coli     MIC (MCG/ML)    Amoxicillin + Clavulanate >=32 Resistant    Ampicillin >=32 Resistant    Ceftriaxone >=64 Resistant    Ciprofloxacin >=4 Resistant    Ertapenem <=0.12 Susceptible    Gentamicin <=1 Susceptible    Nitrofurantoin <=16 Susceptible    Oral Cephalosporins R Resistant    Piperacillin + Tazobactam 64 Resistant    Tobramycin >=16 Resistant    Trimethoprim/Sulfamethoxazole <=20 Susceptible               PETCT 04/14/23  IMPRESSION:  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 Recommend correlation with prior imaging or MR abdomen with hepatobiliary agent (Eovist).      A/P: 82 y.o. female with amnestic MCI, dysautonomia, NET here with husband for follow up.     Diagnoses and all orders for this visit:    Urinary frequency - check to see if UTI has been treated  -     Urinalysis w/Reflex to Culture; Future    Dysautonomia (HCC/RAF) - hypotensive today, suspect dehydration, improved with PO hydration (3 cups of water and 1 pedialyte popsicle)  - push fluids  - consider midrodrine    Other malignant neuroendocrine tumors (HCC/RAF) - presumably metastatic, though unclear of family's and patient's understanding of this  - upcoming repeat PETCT in October  - to schedule follow up with oncology    Amnestic MCI (mild cognitive impairment with memory loss) - progressing  - upcoming evaluation with neurology    Fatigue, unspecified type - multifactorial      F/u 3 months, PRN sooner    The above plan of care, diagnosis, orders, and follow-up were discussed with the patient/caregiver. All questions related to this recommended plan of care were answered.    Janeice Robinson, MD  Family Medicine

## 2023-06-14 ENCOUNTER — Telehealth: Payer: MEDICARE

## 2023-06-14 NOTE — Telephone Encounter
Spoke with daughter [name] in regard to [employer]. Answered all questions. Consider MRI brain, ADC program and/or neuro palliative care.    Janeice Robinson, MD  Family Medicine

## 2023-06-15 ENCOUNTER — Telehealth: Payer: TRICARE (CHAMPUS)

## 2023-06-15 DIAGNOSIS — N3001 Acute cystitis with hematuria: Secondary | ICD-10-CM

## 2023-06-15 MED ORDER — NITROFURANTOIN MONOHYD MACRO 100 MG PO CAPS
100 mg | ORAL_CAPSULE | Freq: Two times a day (BID) | ORAL | 0 refills | 5.00000 days | Status: AC
Start: 2023-06-15 — End: ?

## 2023-06-15 NOTE — Telephone Encounter
Faxed forms to [facility] [phone]. Thank you

## 2023-06-15 NOTE — Telephone Encounter
Called Mr. Sherwin re: UA results. Will renew Macrobid as MDR e coli resistant to all other oral agents except sulfa, to which she's allergic. If still hypotensive, will recommend ED for admission as likely will need IVF and IV antibiotics. Left message requesting call back.    Janeice Robinson, MD  Family Medicine

## 2023-06-17 ENCOUNTER — Ambulatory Visit: Payer: MEDICARE

## 2023-06-17 NOTE — Telephone Encounter
Message to Practice/Provider      Message: Jodi Bentley calling from the [facility] this  morning, she never received the fax on Friday, 06/15/23. Can the office please re-fax it to: [phone]  They will need this order to administer medication to patient.   Thank you.     Patient or caller has been notified that their message will be reviewed within 1-2 business days.

## 2023-06-18 ENCOUNTER — Telehealth: Payer: MEDICARE

## 2023-06-18 ENCOUNTER — Ambulatory Visit: Payer: MEDICARE

## 2023-06-18 DIAGNOSIS — F0390 Unspecified dementia without behavioral disturbance: Secondary | ICD-10-CM

## 2023-06-18 NOTE — Telephone Encounter
Faxed form back to [facility] [phone].thank you

## 2023-06-18 NOTE — Telephone Encounter
I refaxed all of the forms to [phone]

## 2023-06-19 NOTE — Telephone Encounter
Faxed to [name] [place] [phone]. Thank you

## 2023-06-21 ENCOUNTER — Ambulatory Visit: Payer: TRICARE (CHAMPUS) | Attending: Neurology

## 2023-06-21 DIAGNOSIS — R413 Other amnesia: Secondary | ICD-10-CM

## 2023-06-21 DIAGNOSIS — R419 Unspecified symptoms and signs involving cognitive functions and awareness: Secondary | ICD-10-CM

## 2023-06-21 NOTE — Progress Notes
[facility]     PATIENT:  Jodi Bentley  MRN: [id number]  DOB:  25-Jan-1941  DATE OF SERVICE: 06/21/2023    PRIMARY CARE PROVIDER: Sheilah Mins, Cyndia Skeeters., MD    REASON FOR VISIT: follow-up for the evaluation of major nuerocognitive disorder    History of Present Illness   Jodi Bentley is a 82 y.o. woman with progressive cognitive decline here today fro further evaluation together with her husband and daughter.    Jodi Bentley is able to appreciate that things aren't quite where they should be butt does also not seem to appreciate her difficulties to the level that other's around her are. Her concept of the duration of any deficits is also quite off though she was very accurately able to calculate on the spot how long ago it was she retired from work. Exhibited on a few occasions at today's visit loss of train of thought and had some difficulty accessing certain words during conversation.     2020 first note made of some memory concerns in physician notes. Husband was noting some mild memory changes at that time. Also had some falls. Had a fall in autumn 2021 with small SDH. In the wake of that daughter feels there was more notable changes. In March 2022 after further work-up given a diagnosis of MCI 2022.   Has continued to follow with geriatrics physician with repeat MOCA assessments since then. Last score by report was 20/30 (on 10/17/22)     In March 91478 had a large volume LP NPH assessment. 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     No notable alcohol history. No additional noted head trauma history. No known toxin exposures.     Takes Rivastigmine 3mg  BID  Takes 300mg  of Venlafaxine daily plus 15mg  of Buspirone    One of her first cousins with diagnosis of Alzheimer's disease. In retrospect Jodi Bentley recalls some memory changes in Jodi Bentley's mother. She had notable kyphosis which impacted breathing some also.     Interval History 01/14/23:  Larey Seat at the end of Feb and had a femoral fracture (tripped over her pant leg). Still in rehab - has been for the past 3 weeks. Is making progress as per her family.   FDG PET brain scan supporting an Alzheimer's etiology. We spoke about the previous findings on MRI  - that of hemorrhage and the history of SDH in 2021. Interestingly 2022 imaging report without mention of any old blood. Question of interval bleed. Daughter will get image from [facility] and we can have radiology compare to the current imaging. If new that may be further barrier to Jodi Bentley if all dates back to 2021 then may still consider and could pursue amyloid PET scan.     Interval History 04/01/23:  Had been at a skilled nursing rehab from 3/3-4/29 following her surgery   On 5/3, she and her husband moved into an apartment on the early-Alzheimer's floor at [facility] (their retirement community). She is now under their med management program, and she's getting some in-home care. They also offer daily programming and more staff support. I was informed she is not aware that this is a memory care unit   Jodi Bentley is appreciating some of her difficulties and made a statement that she feels she might have Alzheimer's disease in her future. We spoke again about the PET result  and spent a lot of time talking about Lecanemab.    Interval History 06/21/23:  Here today for updated MOCA assessment  No new concerns raised    Past Medical History  Past Medical History:   Diagnosis Date    Histoplasmosis      Medications   Outpatient Medications Prior to Visit   Medication Sig    amLODIPine 5 mg tablet Take 5 mg as needed for systolic blood pressure above 161 mmHg. If still elevated two hours later, may take an additional 5 mg. Max dose 10 mg/24 hours..    bisacodyl (DULCOLAX) 5 mg EC tablet Take 1 tablet (5 mg total) by mouth daily as needed for Constipation.    busPIRone 15 mg tablet Take 1 tablet (15 mg total) by mouth three (3) times daily. (Patient taking differently: Take 3 tablets (45 mg total) by mouth three (3) times daily.)    Cholecalciferol (VITAMIN D3) 10 mcg (400 units) CAPS Take by mouth daily.    LORATADINE 10 mg tablet TAKE 1 TABLET DAILY    nitrofurantoin, macrocrystal monohydrate, (MACROBID) 100 mg capsule Take 1 capsule (100 mg total) by mouth two (2) times daily for 7 days.    pantoprazole 40 mg DR tablet Take 1 tablet (40 mg total) by mouth daily.    rivastigmine 4.5 mg capsule Take 1 capsule (4.5 mg total) by mouth two (2) times daily before meals.    venlafaxine 150 mg 24 hr capsule Take 2 capsules (300 mg total) by mouth daily.     No facility-administered medications prior to visit.     Allergies:   Allergies   Allergen Reactions    Ace Inhibitors Cough     Other reaction(s): Other (See Comments)  cough  cough  cough      Sulfasalazine     Sulfa Antibiotics Rash and Other (See Comments)       Family History:    No family history on file.    Social History:   Social History     Tobacco Use    Smoking status: Former     Types: Cigarettes    Smokeless tobacco: Never        Review of Systems:  Review of systems completed    Objective:     Last Recorded Vital Signs:    06/21/23 1528   BP: 135/82   Pulse: 80     General Examination (observation):  Head: NCAT   Eyes: EOMI  Respiratory: No noted respiratory distress    Mental Status: alert, cooperative. Overall seemed to follow the flow and conversation of the visit well.    MOCA: 20/30 (from other note 10/17/22)  Deficits: unable to copy 3D shape, 2/3 on serial 7 subtraction, 0/1 language fluency (able to name 7 words), 0/5 delayed recall (with MIS 8/15), and 4/6 orientation (thought date was 26th instead of 27th, did not know day of week)    MOCA V7.1 (8/30/4): 19/30    Cranial Nerves:  III, IV, VI-conjugate gaze  VII-facial expression symmetric.    VIII-hearing intact.   IX, X-no dysarthria.     Motor: moves all 4 equally  Gait: assisted    Lab Review: reviewed  B12 1216  TSH 5  Vit D and folate wnls    Imaging:   FDG PET Brain 12/13/22:  1. Abnormal distribution of FDG, most severely affecting uptake of bilateral posterior cingulate and parietotemporal cortices. This pattern would be most consistent with a progressive neuro-degenerative disorder, such  as occurs in AD.     2. Structural changes demonstrating cerebral atrophy    MRI Brain 03/13/22:  1. No acute infarct or hemorrhage.  2. Lateral ventricles are slightly dilated out of proportion to the degree of atrophy, correlate for normal pressure hydrocephalus.  3. Hemosiderin staining along the right frontal lobe and smaller amount along left frontal lobe, likely from prior hemorrhage.  4. Generalized cerebral atrophy with asymmetrically increased atrophy of the parietal lobes.    MRI Brain 01/25/21:  There is generalized atrophy with enlargement of ventricles and   sulci. No acute hemorrhage, abnormal mass effect, restricted diffusion, or   other evidence of acute infarct is identified. The craniocervical junction   is unremarkable. There are no subdural collections. There is no evidence of   significant hemosiderin deposition.     NeuroQuant demonstrates hippocampal occupancy score below the 5th   percentile. Hippocampi volume is between the fifth and 25th percentiles.   Superior and inferior lateral ventricular volume are each well above the   95th percentile for age. Complete NeuroQuant data is available in PACS.     IMPRESSION: Cerebral atrophy without evidence of acute intracranial   abnormality. Although there is prominent dilation of lateral ventricles,   overall features do not favor NPH.   IMPRESSION:  Cerebral atrophy without evidence of acute intracranial   abnormality. Although there is prominent dilation of lateral ventricles,   overall features do not favor NPH.   Assessment:   Jodi Bentley is a 82 y.o. woman with progressive cognitive decline here today fro further evaluation together with her husband and daughter.    Jodi Bentley is able to appreciate that things aren't quite where they should be butt does also not seem to appreciate her difficulties to the level that other's around her are. Her concept of the duration of any deficits is also quite off though she was very accurately able to calculate on the spot how long ago it was she retired from work. Exhibited on a few occasions at today's visit loss of train of thought and had some difficulty accessing certain words during conversation.     2020 first note made of some memory concerns in physician notes. Husband was noting some mild memory changes at that time. Also had some falls. Had a fall in autumn 2021 with small SDH. In the wake of that daughter feels there was more notable changes. In March 2022 after further work-up given a diagnosis of MCI 2022.     Has continued to follow with geriatrics physician with repeat MOCA assessments since then. Last score by report was 20/30 (on 10/17/22)     In March 56213 had a large volume LP NPH assessment. 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     No notable alcohol history. No additional noted head trauma history. No known toxin exposures.     Takes Rivastigmine 4.5mg  BID.  Takes 300mg  of Venlafaxine daily plus 15mg  of Buspirone. We spoke about this dosing and if is good to know that she has a psychiatrist DR Areta Haber managing/monitoring these meds.     One of her first cousins with diagnosis of Alzheimer's disease. In retrospect Jodi Bentley recalls some memory changes in Jodi Bentley's mother. She had notable kyphosis which impacted breathing some also.     FDG PET brain scan supporting an Alzheimer's etiology.  Had been at a skilled nursing rehab from 3/3-4/29 following her surgery for femoral fracture.  On 5/3, she and her husband moved into an apartment on the early-Alzheimer's floor at [facility] (their retirement community). She is now under their med management program, and she's getting some in-home care. They also offer daily programming and more staff support. I was informed she is not aware that this is a memory care unit   Jodi Bentley is appreciating some of her difficulties and made a statement that she feels she might have Alzheimer's disease in her future. We spoke again about the PET result and spent a lot of time talking about Lecanemab.    We spoke about the previous findings on MRI  - that of hemorrhage and the history of SDH in 2021. Interestingly 2022 imaging report without mention of any old blood. Will check with reading Radiologist.    Updated MOCA assessment with score of 19/30 which is largely unchanged from Dec 2023. I again spoke about Lecanemab and they are not sure they would want to proceed with this. We may still pursue amyloid imaging and make a final decision after that. They will let me know.     Plan/ Recommendation:   Rivastigmine 4.5mg  BID  Spoke about exercise (cognitive and physical)  Will consider amyloid PET brain scan  RTC already scheduled in October    Author:  Coralee North. Vivi Ferns, MD 06/21/2023

## 2023-06-22 ENCOUNTER — Inpatient Hospital Stay: Payer: MEDICARE

## 2023-06-22 DIAGNOSIS — Z780 Asymptomatic menopausal state: Secondary | ICD-10-CM

## 2023-06-25 ENCOUNTER — Ambulatory Visit: Payer: TRICARE (CHAMPUS)

## 2023-07-06 ENCOUNTER — Telehealth: Payer: MEDICARE

## 2023-07-06 NOTE — Telephone Encounter
Faxed to [name] to [phone] thank you

## 2023-07-10 ENCOUNTER — Telehealth: Payer: TRICARE (CHAMPUS)

## 2023-07-10 NOTE — Telephone Encounter
Faxed to [facility] to [phone]. Thank you

## 2023-07-16 ENCOUNTER — Telehealth: Payer: MEDICARE

## 2023-07-16 NOTE — Telephone Encounter
Faxed form to [name] to [phone] thank you

## 2023-07-18 ENCOUNTER — Ambulatory Visit: Payer: MEDICARE

## 2023-07-18 DIAGNOSIS — D3A8 Other benign neuroendocrine tumors: Secondary | ICD-10-CM

## 2023-07-30 ENCOUNTER — Inpatient Hospital Stay: Payer: TRICARE (CHAMPUS) | Attending: Gastroenterology

## 2023-07-30 DIAGNOSIS — D3A8 Other benign neuroendocrine tumors: Secondary | ICD-10-CM

## 2023-07-30 MED ADMIN — IODIXANOL 320 MG/ML IV SOLN: 100 mL | INTRAVENOUS | @ 19:00:00 | Stop: 2023-07-30

## 2023-07-30 MED ADMIN — COPPER CU 64 DOTATATE 1 MCI/ML IV SOLN: 4.3 | INTRAVENOUS | @ 19:00:00 | Stop: 2023-07-30

## 2023-08-02 ENCOUNTER — Ambulatory Visit: Payer: MEDICARE

## 2023-08-03 DIAGNOSIS — F0390 Unspecified dementia without behavioral disturbance: Secondary | ICD-10-CM

## 2023-08-07 ENCOUNTER — Telehealth: Payer: TRICARE (CHAMPUS)

## 2023-08-07 NOTE — Telephone Encounter
Spoke to daughter and she stated the patient has been to the hospital a few times due to falling and chronic UTI. The patient was at [facility] on 9/29 which I will request records and she was admitted  to [facility] on 08/05/23. Daughter made an appointment for the patient to come in for hospital follow up on 08/13/23.   Patients daughter stated she has a video visit with the Oncologist  this week   I tried calling [name] regarding to request that was sent in they hung up, so I called the paitents contact which was the daughter she stated [name] had her BP meds as PRN on file

## 2023-08-08 ENCOUNTER — Telehealth: Payer: TRICARE (CHAMPUS) | Attending: Gastroenterology

## 2023-08-08 NOTE — Telephone Encounter
Faxed to [name] [phone] Thank you

## 2023-08-08 NOTE — Progress Notes
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    HISTORY OF PRESENT ILLNESS:  Patient is an 82 year old female who has moved from [place] to [place] and is here for of followup. The patient initially presented with abdominal pain and weight loss. She was evaluated and found to have a tumor in the small bowel. She underwent resection on 11/07/2021, was found to have a grade 2 small bowel neuroendocrine tumor. Ki-67 3% to 20%; it was T4 N1, 2 out of 31 lymph nodes were positive. There was no lymphovascular, but there was perineural invasion. There was tumor at the radial or mesenteric margin. The patient has done well postoperatively. 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 She now presents for further evaluation and treatment options. She had an original CT, which showed the mesenteric mass with mesenteric tethering as far as I can tell, has not had since then.    04/22/23 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 Recommend correlation with prior imaging or MR abdomen with hepatobiliary agent (Eovist). No flushing and diarrhea. Negative FOBT.     08/08/23 PET/CT 10/10 1.  Interval increase in number, size, and intense DOTATATE uptake of innumerable ill-defined hepatic metastatic lesions; compatible with disease progression. If better visualization desired, can consider MRI abdomen with Eovist.2.  Worsening L1 vertebral body compression fracture and several new healing right rib fractures; both associated with moderate dotatate uptake, which may represent chronic/degenerative or posttraumatic changes. Correlate with trauma history and close attention on follow-up.3.  Elsewhere, no new DOTATATE avid metastatic disease.4.  Interval development of marked urinary bladder wall thickening; could represent cystitis. Correlate with urinalysis. No flushing.       Subjective:       Past Medical History:   Diagnosis Date    Histoplasmosis      No past surgical history on file.  Social History     Socioeconomic History    Marital status: Married   Tobacco Use    Smoking status: Former     Types: Cigarettes    Smokeless tobacco: Never     Social Determinants of Psychologist, prison and probation services Strain: Low Risk  (12/23/2022)    Received from [facility] [place], [address] [facility] [place]    Overall Physicist, medical Strain (CARDIA)     Difficulty of Paying Living Expenses: Not very hard     No family history on file.  Current Outpatient Medications   Medication Sig    amLODIPine 5 mg tablet Take 5 mg as needed for systolic blood pressure above 829 mmHg. If still elevated two hours later, may take an additional 5 mg. Max dose 10 mg/24 hours..    bisacodyl (DULCOLAX) 5 mg EC tablet Take 1 tablet (5 mg total) by mouth daily as needed for Constipation.    busPIRone 15 mg tablet Take 1 tablet (15 mg total) by mouth three (3) times daily. (Patient taking differently: Take 3 tablets (45 mg total) by mouth three (3) times daily.)    Cholecalciferol (VITAMIN D3) 10 mcg (  400 units) CAPS Take by mouth daily.    LORATADINE 10 mg tablet TAKE 1 TABLET DAILY    pantoprazole 40 mg DR tablet Take 1 tablet (40 mg total) by mouth daily.    rivastigmine 4.5 mg capsule Take 1 capsule (4.5 mg total) by mouth two (2) times daily before meals.    venlafaxine 150 mg 24 hr capsule Take 2 capsules (300 mg total) by mouth daily.     No current facility-administered medications for this visit.      Allergies   Allergen Reactions    Ace Inhibitors Cough Other reaction(s): Other (See Comments)  cough  cough  cough      Sulfasalazine     Sulfa Antibiotics Rash and Other (See Comments)     Wt Readings from Last 3 Encounters:   06/12/23 64.4 kg (142 lb)   06/01/23 68 kg (150 lb)   04/22/23 69.9 kg (154 lb 3.2 oz)       Review of Systems  Constitutional: negative for anorexia, fatigue and weight loss, level of energy  Eyes: negative for pain, diplopia, dryness  Ears, nose, mouth, throat, and face: negative for ear drainage, epistaxis, hearing loss, hoarseness, nasal congestion, sore mouth and sore throat  Respiratory: negative for cough, dyspnea on exertion and sputum  Cardiovascular: negative for chest pain, exertional chest pressure/discomfort and palpitations  Gastrointestinal: negative for abdominal pain, constipation, diarrhea, dysphagia, nausea, reflux symptoms and vomiting  Integument: negative for dryness, pruritus, rash, skin color change and skin lesion(s)  Hematologic/lymphatic: negative for bleeding, easy bruising, lymphadenopathy and petechiae  Musculoskeletal:negative for arthralgias, muscle weakness and myalgias  Neurological: negative for headaches, memory problems and speech problems  Behavioral/Psych: negative for anxiety and depression  Endocrine: negative for temperature intolerance  Allergic/Immunologic:   Allergies   Allergen Reactions    Ace Inhibitors Cough     Other reaction(s): Other (See Comments)  cough  cough  cough      Sulfasalazine     Sulfa Antibiotics Rash and Other (See Comments)          Objective:        Vitals:There were no vitals taken for this visit.     Wt Readings from Last 1 Encounters:   06/12/23 64.4 kg (142 lb)      Family with pt.     General: Appears well-developed, well-nourished and close to stated age.   Psychiatric: Affect appropriate.  Pleasant and conversant.                  ECOG:2    Lab Review:    Results for orders placed or performed in visit on 07/19/23   CBC   Result Value Ref Range    White Blood Cell Count 9.36 4.16 - 9.95 x10E3/uL    Red Blood Cell Count 4.13 3.96 - 5.09 x10E6/uL    Hemoglobin 12.4 11.6 - 15.2 g/dL    Hematocrit 45.4 09.8 - 45.2 %    Mean Corpuscular Volume 91.0 79.3 - 98.6 fL    Mean Corpuscular Hemoglobin 30.0 26.4 - 33.4 pg    MCH Concentration 33.0 31.5 - 35.5 g/dL    Red Cell Distribution Width-SD 53.0 (H) 36.9 - 48.3 fL    Red Cell Distribution Width-CV 15.9 (H) 11.1 - 15.5 %    Platelet Count, Auto 213 143 - 398 x10E3/uL    Mean Platelet Volume 11.0 9.3 - 13.0 fL    Nucleated RBC%, automated 0.0 No Ref. Range %  Absolute Nucleated RBC Count 0.00 0.00 - 0.00 x10E3/uL    Neutrophil Abs (Prelim) 5.96 See Absolute Neut Ct. x10E3/uL   Differential, Automated   Result Value Ref Range    Neutrophil Percent, Auto 63.8 No Ref. Range %    Lymphocyte Percent, Auto 25.5 No Ref. Range %    Monocyte Percent, Auto 7.5 No Ref. Range %    Eosinophil Percent, Auto 2.2 No Ref. Range %    Basophil Percent, Auto 0.6 No Ref. Range %    Immature Granulocytes% 0.4 No Reference Range %    Absolute Neut Count 5.96 1.80 - 6.90 x10E3/uL    Absolute Lymphocyte Count 2.39 1.30 - 3.40 x10E3/uL    Absolute Mono Count 0.70 0.20 - 0.80 x10E3/uL    Absolute Eos Count 0.21 0.00 - 0.50 x10E3/uL    Absolute Baso Count 0.06 0.00 - 0.10 x10E3/uL    Absolute Immature Gran Count 0.04 0.00 - 0.04 x10E3/uL   CBC & Auto Differential    Narrative    The following orders were created for panel order CBC & Auto Differential.  Procedure                               Abnormality         Status                     ---------                               -----------         ------                     VFI[[id number]]                          Abnormal            Final result               Differential, Automated[[id number]]                          Final result                 Please view results for these tests on the individual orders.     Results for orders placed or performed in visit on 07/19/23   Comprehensive Metabolic Panel   Result Value Ref Range    Sodium 138 135 - 146 mmol/L    Potassium 3.8 3.6 - 5.3 mmol/L    Chloride 101 96 - 106 mmol/L    Total CO2 25 20 - 30 mmol/L    Anion Gap 12 8 - 19 mmol/L    Glucose 121 (H) 65 - 99 mg/dL    Creatinine 4.16 (H) 0.60 - 1.30 mg/dL    Estimated GFR 40 See GFR Additional Information mL/min/1.28m2    GFR Additional Information See Comment     Urea Nitrogen 16 7 - 22 mg/dL    Calcium 9.2 8.6 - 60.6 mg/dL    Total Protein 6.5 6.1 - 8.2 g/dL    Albumin 4.1 3.9 - 5.0 g/dL    Bilirubin,Total 0.2 0.1 - 1.2 mg/dL    Alkaline Phosphatase 128 (H) 37 - 113 U/L    Aspartate Aminotransferase 26 13 - 62 U/L  Alanine Aminotransferase 15 8 - 70 U/L     No results found for: ''CA125''  No results found for: ''CEAG'', ''CA199''         Other Tests Reviewed:  IMAGING:    PETCT with Diagnostic CT of the neck-chest-abd-pelvis w/IV contrast; Somatostatin receptor imaging (NETSPOT, DETECTNET)    Result Date: 08/01/2023  History of small bowel neuroendocrine tumor, status post resection, with: . 1.  Interval increase in number, size, and intense DOTATATE uptake of innumerable ill-defined hepatic metastatic lesions; compatible with disease progression. If better visualization desired, can consider MRI abdomen with Eovist. 2.  Worsening L1 vertebral body compression fracture and several new healing right rib fractures; both associated with moderate dotatate uptake, which may represent chronic/degenerative or posttraumatic changes. Correlate with trauma history and close attention on follow-up. 3.  Elsewhere, no new DOTATATE avid metastatic disease. 4.  Interval development of marked urinary bladder wall thickening; could represent cystitis. Correlate with urinalysis. [id number] Thank you for allowing [facility] to assist in the care of this patient. This study has been reviewed jointly by Radiologist Delight Ovens, D.O. and Nuclear Medicine Physician Silvestre Moment, M.D. The above constitutes a synthesis of both the CT and PET findings and the physicians have reviewed and are in agreement with the report. Dictated by: Norberta Keens   07/31/2023 2:15 PM       Assessment/Plan:     Metastatic SB NET: Some progression on DOTATATE and diarrhea. Preserved liver function. Suggest stating lanreotide in [place].   Return in about 7 weeks (around 09/26/2023).    I spent 35 minutes reviewing chart, face to face with patient and family and coordinating care    I am providing longitudinal care for this serious problem of NET.     Author: Tye [place]. Michele Mcalpine, MD

## 2023-08-09 ENCOUNTER — Ambulatory Visit: Payer: MEDICARE

## 2023-08-09 ENCOUNTER — Telehealth: Payer: MEDICARE

## 2023-08-09 DIAGNOSIS — C7A8 Other malignant neuroendocrine tumors: Secondary | ICD-10-CM

## 2023-08-09 NOTE — Patient Instructions
Will set up injection at [facility] and see in 7 weeks.

## 2023-08-09 NOTE — Telephone Encounter
Spoke with patient to advise Dr. Michele Mcalpine & Azalee Course NP referred patient over for lanreotide injection. Scheduled patient for Monday 08/12/23 at 1:30 pm.

## 2023-08-12 ENCOUNTER — Ambulatory Visit: Payer: MEDICARE

## 2023-08-12 MED ADMIN — LANREOTIDE ACETATE 120 MG/0.5ML SC SOLN: 120 mg | SUBCUTANEOUS | @ 22:00:00 | Stop: 2023-08-13

## 2023-08-12 NOTE — Addendum Note
Addended by: Alfonso Patten A on: 08/12/2023 02:11 PM     Modules accepted: Orders

## 2023-08-12 NOTE — Addendum Note
Addended by: Alfonso Patten A on: 08/12/2023 01:53 PM     Modules accepted: Orders

## 2023-08-13 ENCOUNTER — Ambulatory Visit: Payer: MEDICARE

## 2023-08-13 DIAGNOSIS — S32010A Wedge compression fracture of first lumbar vertebra, initial encounter for closed fracture: Secondary | ICD-10-CM

## 2023-08-13 DIAGNOSIS — N390 Urinary tract infection, site not specified: Secondary | ICD-10-CM

## 2023-08-13 DIAGNOSIS — Z1612 Extended spectrum beta lactamase (ESBL) resistance: Secondary | ICD-10-CM

## 2023-08-13 DIAGNOSIS — B9629 Other Escherichia coli [E. coli] as the cause of diseases classified elsewhere: Secondary | ICD-10-CM

## 2023-08-13 MED ORDER — LIDOCAINE 5 % EX PTCH
1 | MEDICATED_PATCH | Freq: Every day | TRANSDERMAL | 0 refills | Status: AC
Start: 2023-08-13 — End: ?

## 2023-08-13 NOTE — Progress Notes
[facility]   Progress Note    CC:   Chief Complaint   Patient presents with    [facility] Discharge Follow Up     Pt had fall on 08/05/23 head injury        S: Breshawna Bacik is a 82 y.o. female with metastatic NET, major neurocognitive disorder, osteoporosis, chronic depression and anxiety, recurrent UTI here with daughter for ED follow up.     Two recent ED [facility] [place] [place]) visits for falls, CT head negative x 2, worsening of L1 compression fracture, recurrent UTI treated with Macrobid x 2.  Dizzy, nauseous, tired, still some dysuria, still on Macrobid  L1 compression fracture - pain intermittent    Past Medical History:   Diagnosis Date    Histoplasmosis        ROS    Medications that the patient states to be currently taking   Medication Sig    amLODIPine 5 mg tablet Take 5 mg as needed for systolic blood pressure above 782 mmHg. If still elevated two hours later, may take an additional 5 mg. Max dose 10 mg/24 hours..    bisacodyl (DULCOLAX) 5 mg EC tablet Take 1 tablet (5 mg total) by mouth daily as needed for Constipation.    busPIRone 15 mg tablet Take 1 tablet (15 mg total) by mouth three (3) times daily. (Patient taking differently: Take 3 tablets (45 mg total) by mouth three (3) times daily.)    Cholecalciferol (VITAMIN D3) 10 mcg (400 units) CAPS Take by mouth daily.    LORATADINE 10 mg tablet TAKE 1 TABLET DAILY    nitrofurantoin, macrocrystal monohydrate, (MACROBID) 100 mg capsule Take 1 capsule (100 mg total) by mouth.    pantoprazole 40 mg DR tablet Take 1 tablet (40 mg total) by mouth daily.    rivastigmine 4.5 mg capsule Take 1 capsule (4.5 mg total) by mouth two (2) times daily before meals.    venlafaxine 150 mg 24 hr capsule Take 2 capsules (300 mg total) by mouth daily.       O: BP 106/65  ~ Pulse (!) 103  ~ Temp 36.4 ?C (97.5 ?F) (Tympanic)  ~ Ht 5' 5'' (1.651 m)  ~ Wt 138 lb 12.8 oz (63 kg)  ~ SpO2 95%  ~ BMI 23.10 kg/m?   Physical Exam    PETCT 07/30/23  IMPRESSION:  History of small bowel neuroendocrine tumor, status post resection, with: .  1.  Interval increase in number, size, and intense DOTATATE uptake of innumerable ill-defined hepatic metastatic lesions; compatible with disease progression. If better visualization desired, can consider MRI abdomen with Eovist.  2.  Worsening L1 vertebral body compression fracture and several new healing right rib fractures; both associated with moderate dotatate uptake, which may represent chronic/degenerative or posttraumatic changes. Correlate with trauma history and close attention on follow-up.  3.  Elsewhere, no new DOTATATE avid metastatic disease.  4.  Interval development of marked urinary bladder wall thickening; could represent cystitis. Correlate with urinalysis.    CT head wo contrast 08/05/23  Impression:   1. No evidence of acute intracranial hemorrhage, midline shift or mass effect.     2. Mild cerebral volume loss with mild nonspecific periventricular white   matter hypodensity probably secondary to chronic small vessel ischemic disease.     Dictated By: Vito Backers D.O. on 08/05/2023 13:29   Electronically Signed By: Vito Backers D.O. on 08/05/2023 13:29     CT L spine  wo contrast 08/05/23  Impression:   Suspected recent severe compression deformity of L1 resulting in 50-60%   vertebral body height loss.     Dictated By: Vito Backers D.O. on 08/05/2023 13:22   Electronically Signed By: Vito Backers D.O. on 08/05/2023 13:22       A/P: 82 y.o. female with metastatic NET, major neurocognitive disorder, osteoporosis, chronic depression and anxiety, recurrent UTI here with daughter for ED follow up.     Diagnoses and all orders for this visit:    Recurrent UTI; Urinary tract infection due to extended-spectrum beta lactamase (ESBL) producing Escherichia coli - test of cure after course of nitrofurantoin  -     Urinalysis Routine; Future  -     Bacterial Culture Urine, LAB COLLECT; Future  - if still growing ESBL E coli, can consider trial of IV antibiotics (only other oral option is sulfa and she's allergic)  - if no improvement after this, then likely colonized    Major neurocognitive disorder (HCC/RAF); Unspecified mood (affective) disorder (HCC/RAF); Other malignant neuroendocrine tumors (HCC/RAF)  -     Referral to Palliative Care (Clinic, Home or SNF) - neuropalliative care referral for goals of care, emotional support, neuropsychiatric symptom management, some pain management    Closed compression fracture of body of L1 vertebra (HCC/RAF)  -     lidocaine 5% patch; Place 1 patch onto the skin daily. 12 hours on 12 hours off    F/u 1 month    The above plan of care, diagnosis, orders, and follow-up were discussed with the patient/caregiver. All questions related to this recommended plan of care were answered.    Janeice Robinson, MD  Family Medicine

## 2023-08-14 ENCOUNTER — Telehealth: Payer: MEDICARE

## 2023-08-15 NOTE — Telephone Encounter
Good afternoon,     RE: Jodi Bentley, Jodi Bentley [id number]  I contacted the patient at [phone]  to follow up on the Palliative Care referral. I spoke with patient's daughter  and discussed the Palliative Care referral. I answered any questions or concerns regarding palliative care and offered assistance scheduling a palliative care consultation.   Patient has been scheduled for the following    Dr. Othelia Pulling   12/04/2023 at 1:30  Video visit     I will send reminder     [facility]   Administrative Assistant III  [facility]   P: [phone]

## 2023-08-19 ENCOUNTER — Ambulatory Visit: Payer: TRICARE (CHAMPUS) | Attending: Neurology

## 2023-08-19 ENCOUNTER — Ambulatory Visit: Payer: TRICARE (CHAMPUS)

## 2023-08-19 DIAGNOSIS — F0280 Dementia in other diseases classified elsewhere without behavioral disturbance: Secondary | ICD-10-CM

## 2023-08-19 DIAGNOSIS — G309 Alzheimer's disease, unspecified: Secondary | ICD-10-CM

## 2023-08-19 NOTE — Progress Notes
[facility]     PATIENT:  Jodi Bentley  MRN: [id number]  DOB:  26-Mar-1941  DATE OF SERVICE: 08/19/2023    PRIMARY CARE PROVIDER: Sheilah Mins, Cyndia Skeeters., MD    REASON FOR VISIT: follow-up for the evaluation of major nuerocognitive disorder    History of Present Illness   Exilda Pettinato is a 82 y.o. woman with progressive cognitive decline here today fro further evaluation together with her husband and daughter.    [name] is able to appreciate that things aren't quite where they should be butt does also not seem to appreciate her difficulties to the level that other's around her are. Her concept of the duration of any deficits is also quite off though she was very accurately able to calculate on the spot how long ago it was she retired from work. Exhibited on a few occasions at today's visit loss of train of thought and had some difficulty accessing certain words during conversation.     2020 first note made of some memory concerns in physician notes. Husband was noting some mild memory changes at that time. Also had some falls. Had a fall in autumn 2021 with small SDH. In the wake of that daughter feels there was more notable changes. In March 2022 after further work-up given a diagnosis of MCI 2022.   Has continued to follow with geriatrics physician with repeat MOCA assessments since then. Last score by report was 20/30 (on 10/17/22)     In March [address] had a large volume LP NPH assessment. 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     No notable alcohol history. No additional noted head trauma history. No known toxin exposures.     Takes Rivastigmine 3mg  BID  Takes 300mg  of Venlafaxine daily plus 15mg  of Buspirone    One of her first cousins with diagnosis of Alzheimer's disease. In retrospect Richard recalls some memory changes in Naziyah's mother. She had notable kyphosis which impacted breathing some also.     Interval History 01/14/23:  Larey Seat at the end of Feb and had a femoral fracture (tripped over her pant leg). Still in rehab - has been for the past 3 weeks. Is making progress as per her family.   FDG PET brain scan supporting an Alzheimer's etiology. We spoke about the previous findings on MRI  - that of hemorrhage and the history of SDH in 2021. Interestingly 2022 imaging report without mention of any old blood. Question of interval bleed. Daughter will get image from [facility] and we can have radiology compare to the current imaging. If new that may be further barrier to [place] if all dates back to 2021 then may still consider and could pursue amyloid PET scan.     Interval History 04/01/23:  Had been at a skilled nursing rehab from 3/3-4/29 following her surgery   On 5/3, she and her husband moved into an apartment on the early-Alzheimer's floor at [facility] (their retirement community). She is now under their med management program, and she's getting some in-home care. They also offer daily programming and more staff support. I was informed she is not aware that this is a memory care unit   Mazey is appreciating some of her difficulties and made a statement that she feels she might have Alzheimer's disease in her future. We spoke again about the  PET result and spent a lot of time talking about Lecanemab.    Interval History 06/21/23:  Here today for updated MOCA assessment  No new concerns raised    Interval History 08/19/23:  There are in separate living quarters now and that has helped. [name] in Memory care. She is doing some of the activities and eats atleast one of her meals with others. Daughter believes this has been helpful. No consider Lecanemab therapy. Has a neuroendocrine tumor that has shown some progression.    Past Medical History  Past Medical History:   Diagnosis Date    Histoplasmosis      Medications   Outpatient Medications Prior to Visit   Medication Sig    amLODIPine 5 mg tablet Take 5 mg as needed for systolic blood pressure above 161 mmHg. If still elevated two hours later, may take an additional 5 mg. Max dose 10 mg/24 hours..    bisacodyl (DULCOLAX) 5 mg EC tablet Take 1 tablet (5 mg total) by mouth daily as needed for Constipation.    busPIRone 15 mg tablet Take 1 tablet (15 mg total) by mouth three (3) times daily. (Patient taking differently: Take 3 tablets (45 mg total) by mouth three (3) times daily.)    Cholecalciferol (VITAMIN D3) 10 mcg (400 units) CAPS Take by mouth daily.    lidocaine 5% patch Place 1 patch onto the skin daily. 12 hours on 12 hours off    LORATADINE 10 mg tablet TAKE 1 TABLET DAILY    pantoprazole 40 mg DR tablet Take 1 tablet (40 mg total) by mouth daily.    rivastigmine 4.5 mg capsule Take 1 capsule (4.5 mg total) by mouth two (2) times daily before meals.    venlafaxine 150 mg 24 hr capsule Take 2 capsules (300 mg total) by mouth daily.     No facility-administered medications prior to visit.     Allergies:   Allergies   Allergen Reactions    Ace Inhibitors Cough     Other reaction(s): Other (See Comments)  cough  cough  cough      Sulfasalazine     Sulfa Antibiotics Rash and Other (See Comments)       Family History:    No family history on file.    Social History:   Social History     Tobacco Use    Smoking status: Former     Types: Cigarettes    Smokeless tobacco: Never        Review of Systems:  Review of systems completed    Objective:     Last Recorded Vital Signs:    08/19/23 1423   BP: 107/67   Pulse: 80     General Examination:  Head: NCAT  Eyes: EOMI  Respiratory: No noted respiratory distress  Back: Grossly normal curvature  Musculoskeletal. No clear restrictions to ROM in limbs    Mental Status: alert, cooperative. She was expecting somebody different today. Overall seemed to follow the flow and conversation of the visit well.    MOCA: 20/30 (from other note 10/17/22)  Deficits: unable to copy 3D shape, 2/3 on serial 7 subtraction, 0/1 language fluency (able to name 7 words), 0/5 delayed recall (with MIS 8/15), and 4/6 orientation (thought date was 26th instead of 27th, did not know day of week)    MOCA V7.1 (06/21/23): 19/30    Cranial Nerves:  III, IV, VI-conjugate gaze  VII-facial expression symmetric.    VIII-hearing intact.   IX,  X-no dysarthria.     Motor Exam: symmetric bulk. Moves all 4 equally  Coordination: no ataxia  Gait: steady casual     Lab Review: reviewed  B12 1216  TSH 5  Vit D and folate wnls    Imaging:   FDG PET Brain 12/13/22:  1. Abnormal distribution of FDG, most severely affecting uptake of bilateral posterior cingulate and parietotemporal cortices. This pattern would be most consistent with a progressive neuro-degenerative disorder, such as occurs in AD.     2. Structural changes demonstrating cerebral atrophy    MRI Brain 03/13/22:  1. No acute infarct or hemorrhage.  2. Lateral ventricles are slightly dilated out of proportion to the degree of atrophy, correlate for normal pressure hydrocephalus.  3. Hemosiderin staining along the right frontal lobe and smaller amount along left frontal lobe, likely from prior hemorrhage.  4. Generalized cerebral atrophy with asymmetrically increased atrophy of the parietal lobes.    MRI Brain 01/25/21:  There is generalized atrophy with enlargement of ventricles and   sulci. No acute hemorrhage, abnormal mass effect, restricted diffusion, or   other evidence of acute infarct is identified. The craniocervical junction   is unremarkable. There are no subdural collections. There is no evidence of   significant hemosiderin deposition.     NeuroQuant demonstrates hippocampal occupancy score below the 5th   percentile. Hippocampi volume is between the fifth and 25th percentiles.   Superior and inferior lateral ventricular volume are each well above the   95th percentile for age. Complete NeuroQuant data is available in PACS.     IMPRESSION: Cerebral atrophy without evidence of acute intracranial   abnormality. Although there is prominent dilation of lateral ventricles,   overall features do not favor NPH.   IMPRESSION:  Cerebral atrophy without evidence of acute intracranial   abnormality. Although there is prominent dilation of lateral ventricles,   overall features do not favor NPH.   Assessment:   Nolia Fitzpatrick is a 82 y.o. woman with progressive cognitive decline here today fro further evaluation together with her husband and daughter.    [name] is able to appreciate that things aren't quite where they should be butt does also not seem to appreciate her difficulties to the level that other's around her are. Her concept of the duration of any deficits is also quite off though she was very accurately able to calculate on the spot how long ago it was she retired from work. Exhibited on a few occasions at today's visit loss of train of thought and had some difficulty accessing certain words during conversation.     2020 first note made of some memory concerns in physician notes. Husband was noting some mild memory changes at that time. Also had some falls. Had a fall in autumn 2021 with small SDH. In the wake of that daughter feels there was more notable changes. In March 2022 after further work-up given a diagnosis of MCI 2022.     Has continued to follow with geriatrics physician with repeat MOCA assessments since then. Last score by report was 20/30 (on 10/17/22)     In March 540[address] had a large volume LP NPH assessment. 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     No notable alcohol history. No additional noted head trauma history. No known toxin exposures.     Takes Rivastigmine 4.5mg   BID.  Takes 300mg  of Venlafaxine daily plus 15mg  of Buspirone. We spoke about this dosing and if is good to know that she has a psychiatrist Dr Areta Haber managing/monitoring these meds.     One of her first cousins with diagnosis of Alzheimer's disease. In retrospect Richard recalls some memory changes in Kadyn's mother. She had notable kyphosis which impacted breathing some also.     FDG PET brain scan supporting an Alzheimer's etiology.     Masheka is appreciating some of her difficulties and made a statement that she feels she might have Alzheimer's disease in her future.     We spoke about the previous findings on MRI  - that of hemorrhage and the history of SDH in 2021. Interestingly 2022 imaging report without mention of any old blood. Will check with reading Radiologist.    Updated MOCA assessment with score of 19/30 which is largely unchanged from Dec 2023.    Lives at the [facility]. There are in separate living quarters now and that has helped. [name] in Memory care. She is doing some of the activities and eats atleast one of her meals with others. Daughter believes this has been helpful. No consider Lecanemab therapy.    Major neurocognitive disorder - Alzheimer's disease    Plan/ Recommendation:   Rivastigmine 4.5mg  BID  Spoke about exercise (cognitive and physical)  RTC in 4-6 months    Author:  Coralee North. Vivi Ferns, MD 08/19/2023

## 2023-08-21 ENCOUNTER — Telehealth: Payer: TRICARE (CHAMPUS)

## 2023-08-22 NOTE — Telephone Encounter
Faxed form back to [name] [phone] thank you

## 2023-08-26 ENCOUNTER — Telehealth: Payer: TRICARE (CHAMPUS)

## 2023-08-26 NOTE — Telephone Encounter
Call Back Request      Reason for call back: pt daughter calling in wanting to sch for J1930 (CPT?) - LANREOTIDE INJECTION     Call back: [phone]      Any Symptoms:  []  Yes  [x]  No      If yes, what symptoms are you experiencing:    Duration of symptoms (how long):    Have you taken medication for symptoms (OTC or Rx):      If call was taken outside of clinic hours:    [] Patient or caller has been notified that this message was sent outside of normal clinic hours.     [] Patient or caller has been warm transferred to the physician's answering service. If applicable, patient or caller informed to please call us back if symptoms progress.  Patient or caller has been notified of the turnaround time of 1-2 business day(s).

## 2023-08-30 MED ADMIN — LANREOTIDE ACETATE 120 MG/0.5ML SC SOLN: 120 mg | SUBCUTANEOUS | @ 22:00:00 | Stop: 2023-08-30

## 2023-09-02 ENCOUNTER — Ambulatory Visit: Payer: TRICARE (CHAMPUS)

## 2023-09-03 ENCOUNTER — Ambulatory Visit: Payer: TRICARE (CHAMPUS)

## 2023-09-07 ENCOUNTER — Telehealth: Payer: TRICARE (CHAMPUS)

## 2023-09-07 NOTE — Telephone Encounter
Faxed to [name] [phone] thank you

## 2023-09-11 MED ORDER — LIDOCAINE 5 % EX PTCH
MEDICATED_PATCH | 11 refills | Status: AC
Start: 2023-09-11 — End: ?

## 2023-09-12 ENCOUNTER — Telehealth: Payer: MEDICARE

## 2023-09-13 NOTE — Telephone Encounter
Hello     Called patient to cancel 12/12 RV due MD being OOO, patient did not answer lvm for patient to call back and schedule Upon CB anyone may assist.    Thank you     Molly Maduro-

## 2023-09-27 ENCOUNTER — Ambulatory Visit: Payer: TRICARE (CHAMPUS)

## 2023-09-30 ENCOUNTER — Ambulatory Visit: Payer: TRICARE (CHAMPUS)

## 2023-10-03 ENCOUNTER — Ambulatory Visit: Payer: TRICARE (CHAMPUS) | Attending: Gastroenterology

## 2023-10-04 ENCOUNTER — Ambulatory Visit: Payer: TRICARE (CHAMPUS)

## 2023-10-05 ENCOUNTER — Telehealth: Payer: MEDICARE

## 2023-10-06 NOTE — Telephone Encounter
Faxed to [name] to [phone] thank you

## 2023-10-07 MED ADMIN — LANREOTIDE ACETATE 120 MG/0.5ML SC SOLN: 120 mg | SUBCUTANEOUS | @ 22:00:00 | Stop: 2023-10-07

## 2023-10-10 ENCOUNTER — Telehealth: Payer: TRICARE (CHAMPUS) | Attending: Gastroenterology

## 2023-10-10 NOTE — Progress Notes
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     HISTORY OF PRESENT ILLNESS:  Patient is an 82 year old female who has moved from [place] to [place] and is here for of followup. The patient initially presented with abdominal pain and weight loss. She was evaluated and found to have a tumor in the small bowel. She underwent resection on 11/07/2021, was found to have a grade 2 small bowel neuroendocrine tumor. Ki-67 3% to 20%; it was T4 N1, 2 out of 31 lymph nodes were positive. There was no lymphovascular, but there was perineural invasion. There was tumor at the radial or mesenteric margin. The patient has done well postoperatively. 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 She now presents for further evaluation and treatment options. She had an original CT, which showed the mesenteric mass with mesenteric tethering as far as I can tell, has not had since then.     04/22/23 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 Recommend correlation with prior imaging or MR abdomen with hepatobiliary agent (Eovist). No flushing and diarrhea. Negative FOBT.      08/08/23 PET/CT 10/10 1.  Interval increase in number, size, and intense DOTATATE uptake of innumerable ill-defined hepatic metastatic lesions; compatible with disease progression. If better visualization desired, can consider MRI abdomen with Eovist.2.  Worsening L1 vertebral body compression fracture and several new healing right rib fractures; both associated with moderate dotatate uptake, which may represent chronic/degenerative or posttraumatic changes. Correlate with trauma history and close attention on follow-up.3.  Elsewhere, no new DOTATATE avid metastatic disease.4.  Interval development of marked urinary bladder wall thickening; could represent cystitis. Correlate with urinalysis. No flushing.           10/10/23 Started lanreotide  #1 08/30/23, #2 09/29/23 in assisted living, daughter with her on the call.  Some weight loss. Occ diarrhea.  Decreased appetite.  To see Geri Palliative.       Subjective:       Past Medical History:   Diagnosis Date    Histoplasmosis      No past surgical history on file.  Social History     Socioeconomic History    Marital status: Married   Tobacco Use    Smoking status: Former     Types: Cigarettes    Smokeless tobacco: Never     Social Determinants of Psychologist, prison and probation services Strain: Low Risk  (12/23/2022)    Received from [facility] [place], [address] [facility] [place]    Overall Physicist, medical Strain (CARDIA)     Difficulty of Paying Living Expenses: Not very hard     No family history on file.  Current Outpatient Medications   Medication Sig    amLODIPine 5 mg tablet Take 5 mg as needed for systolic blood pressure above 846 mmHg. If still elevated two hours later, may take an additional 5 mg. Max dose 10 mg/24 hours..    bisacodyl (DULCOLAX) 5 mg EC tablet Take 1  tablet (5 mg total) by mouth daily as needed for Constipation.    busPIRone 15 mg tablet Take 1 tablet (15 mg total) by mouth three (3) times daily. (Patient taking differently: Take 3 tablets (45 mg total) by mouth three (3) times daily.)    Cholecalciferol (VITAMIN D3) 10 mcg (400 units) CAPS Take by mouth daily.    lidocaine 5% patch APPLY 1 PATCH TOPICALLY DAILY (12 HOURS ON AND 12 HOURS OFF) TO LOWER BACK    LORATADINE 10 mg tablet TAKE 1 TABLET DAILY    pantoprazole 40 mg DR tablet Take 1 tablet (40 mg total) by mouth daily.    rivastigmine 4.5 mg capsule Take 1 capsule (4.5 mg total) by mouth two (2) times daily before meals.    venlafaxine 150 mg 24 hr capsule Take 2 capsules (300 mg total) by mouth daily.     No current facility-administered medications for this visit.      Allergies   Allergen Reactions    Ace Inhibitors Cough     Other reaction(s): Other (See Comments)  cough  cough  cough      Sulfasalazine     Sulfa Antibiotics Rash and Other (See Comments)     Wt Readings from Last 3 Encounters:   10/07/23 60.3 kg (133 lb)   08/30/23 63 kg (139 lb)   08/13/23 63 kg (138 lb 12.8 oz)       Review of Systems  Constitutional: negative for anorexia, fatigue and weight loss, level of energy  Eyes: negative for pain, diplopia, dryness  Ears, nose, mouth, throat, and face: negative for ear drainage, epistaxis, hearing loss, hoarseness, nasal congestion, sore mouth and sore throat  Respiratory: negative for cough, dyspnea on exertion and sputum  Cardiovascular: negative for chest pain, exertional chest pressure/discomfort and palpitations  Gastrointestinal: negative for abdominal pain, constipation, diarrhea, dysphagia, nausea, reflux symptoms and vomiting  Integument: negative for dryness, pruritus, rash, skin color change and skin lesion(s)  Hematologic/lymphatic: negative for bleeding, easy bruising, lymphadenopathy and petechiae  Musculoskeletal:negative for arthralgias, muscle weakness and myalgias  Neurological: negative for headaches, memory problems and speech problems  Behavioral/Psych: negative for anxiety and depression  Endocrine: negative for temperature intolerance  Allergic/Immunologic:   Allergies   Allergen Reactions    Ace Inhibitors Cough     Other reaction(s): Other (See Comments)  cough  cough  cough      Sulfasalazine     Sulfa Antibiotics Rash and Other (See Comments)          Objective:        Vitals:There were no vitals taken for this visit.     Wt Readings from Last 1 Encounters:   10/07/23 60.3 kg (133 lb)         General: Appears well-developed, well-nourished and close to stated age.   Psychiatric: Affect appropriate.  Pleasant and conversant.                  ECOG:3    Lab Review:    Results for orders placed or performed in visit on 08/12/23   CBC & Auto Differential   Result Value Ref Range    WBC ([facility]) 10.52 (H) 3.98 - 10.04 10??/uL    RBC ([facility]) 4.06 3.90 - 6.10 10x6/uL    Hemoglobin ([facility]) 12.1 11.2 - 15.7 g/dL    Hematocrit ([facility]) 37.0 34.1 - 44.9 %    MCV ([facility]) 91.1 79.0 - 94.8 fL    MCH ([facility]) 29.8 25.6 -  32.2 pg    MCHC ([facility]) 32.7 32.2 - 36.5 g/dL    RDW-CV ([facility]) 16.1 (H) 11.6 - 14.4 %    Platelets ([facility]) 219 163 - 369 10??/uL    MPV ([facility]) 10.1 9.4 - 12.4 fL    Neutrophil % ([facility]) 64.5 34.0 - 71.1 %    Lymphocyte % ([facility]) 23.4 19.3 - 53.1 %    Monocyte % ([facility]) 8.5 4.7 - 12.5 %    Eosinophil % ([facility]) 2.5 0.7 - 7.0 %    Basophil % ([facility]) 0.8 0.1 - 1.2 %    Neutrophil # ([facility]) 6.80 (H) 1.56 - 6.13 10??/uL    Lymphocyte # ([facility]) 2.46 1.18 - 3.74 10??/uL    Monocyte # ([facility]DAQ) 0.89 (H) 0.24 - 0.86 10??/uL    Eosinophil # (LabDAQ) 0.26 0.04 - 0.54 10??/uL    Basophil # ([facility]Q) 0.08 0.01 - 0.08 10??/uL    IG% ([facility]) 0.3 0.0 - 0.5 %    IG# ([facility]) 0.03 0.00 - 0.03 10x3/uL     Results for orders placed or performed in visit on 08/12/23   Comprehensive Metabolic Panel, Serum   Result Value Ref Range    Sodium 139 135 - 146 mmol/L    Potassium 3.6 3.6 - 5.3 mmol/L    Chloride 102 96 - 106 mmol/L    Total CO2 24 20 - 30 mmol/L    Anion Gap 13 8 - 19 mmol/L    Glucose 99 65 - 99 mg/dL    Creatinine 0.96 0.45 - 1.30 mg/dL    Estimated GFR 53 See GFR Additional Information mL/min/1.76m2    GFR Additional Information See Comment     Urea Nitrogen 9 7 - 22 mg/dL    Calcium 8.9 8.6 - 40.9 mg/dL    Total Protein 6.6 6.1 - 8.2 g/dL    Albumin 4.1 3.9 - 5.0 g/dL    Bilirubin,Total 0.3 0.1 - 1.2 mg/dL    Alkaline Phosphatase 125 (H) 37 - 113 U/L    Aspartate Aminotransferase 18 13 - 62 U/L    Alanine Aminotransferase 9 8 - 70 U/L     No results found for: ''CA125''  No results found for: ''CEAG'', ''CA199''         Other Tests Reviewed:  IMAGING:    No imaging has been resulted in the last 30 days     Assessment/Plan:     Metastatic NET:  Started somatostatin analog.  Unclear if having any symptoms from that or her neuroendocrine tumor.      Wt Loss: May be multifactorial. Interested in seeing what geriatric palliative says    I spent 25 minutes reviewing chart, face to face and coordinating care    I am providing longitudinal care for this serious problem of NET.     No follow-ups on file.        Author: Zerita Boers, NP     J. Braulio Bosch, MD

## 2023-10-11 NOTE — Patient Instructions
Will order labs next shot. Will set up scans after next shot and see Dr. Michele Mcalpine. Will check TSH.

## 2023-10-18 ENCOUNTER — Ambulatory Visit: Payer: TRICARE (CHAMPUS) | Attending: Student in an Organized Health Care Education/Training Program

## 2023-10-18 DIAGNOSIS — R197 Diarrhea, unspecified: Secondary | ICD-10-CM

## 2023-10-18 NOTE — Progress Notes
[facility] HEALTH [facility]ED[facility]  VISIT DATE: 10/18/2023    MRN: [id number]  DOB: Nov 27, 1940       SUBJECTIVE   Jodi Bentley is a 82 y.o. female had concerns including Diarrhea (For 3 weeks, states imodium is not helping).    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 visit on 08/05/23 - diagnosed with UTI, given 10 days of macrobid  Saw Dr. Lawana Pai on 08/13/23 for ER discharge follow up, urine tested again and was clean   History provided by patient, husband, and son     ROS: 14 points ROS non-contributory/neg except noted as above.    ALLX Reviewed  MEDS Reviewed      OBJECTIVE   VS: BP 119/64  ~ Pulse 70  ~ Temp 36.3 ?C (97.3 ?F) (Tympanic)  ~ SpO2 96%     General: Well developed, well nourished, in no acute discomfort  HEENT: EOMI  Psychologic:  Normal speech, mood, affect, intact memory  Respiratory: Breathing non labored and non dyspneic  Cardiovascular: no peripheral edema   Gastrointestinal: Soft, non-tender without rebound or guarding, non-distended  Skin: No visible rashes    Labs Reviewed:  Results for orders placed or performed in visit on 08/29/23   Bacterial Culture Urine, LAB COLLECT    Specimen: Clean Catch, Midstream; Urine   Result Value Ref Range    Bacterial Culture Urine 10,000 CFU/mL Lactose Positive Gram Negative Rod (A)    UA,Dipstick    Specimen: Clean Catch, Midstream; Urine   Result Value Ref Range    Urine Color Light-Yellow      Specific Gravity 1.010 1.005 - 1.030    pH,Urine 7.0 5.0 - 8.0    Blood Negative Negative    Bilirubin Negative Negative    Ketones Negative Negative    Glucose Negative Negative    Protein Negative Negative    Leukocyte Esterase Negative Negative    Nitrite Negative Negative   UA,Microscopic    Specimen: Clean Catch, Midstream; Urine   Result Value Ref Range    RBC per uL 1 0 - 11 cells/uL    WBC per uL 8 0 - 22 cells/uL    RBC per HPF 0 0 - 2 cells/HPF    WBC per HPF 1 0 - 4 cells/HPF    Squamous Epi Cells 9 0 - 17 cells/uL       ASSESSMENT & PLAN     1. Diarrhea, unspecified type  - C.difficile PCR with Reflex to Toxin Antigen; Future  - Bact Enteric Pathogen Panel PCR, Stool; Future  - Parasite Enteric Pathogen Panel; Future  - H pylori Ag, Stool; Future  - Referral to Gastroenterology    Vitals wnl  Acute on chronic worsening of diarrhea, current episode lasting 2 weeks   Reviewed [facility]  and follow up with Dr. Lawana Pai on 08/13/23   Stool studies as above   Push fluids  Encouraged full healthy diet with lots of fruits and vegetables and an emphasis on protein   Advised against immodium, as source of diarrhea might be infectious  Referred to GI   ER precautions as below      ER and return precautions discussed:  If showing any of these signs, seek emergency medical care immediately  - Trouble breathing  - Persistent pain or pressure in the chest  - Pain out of proportion  - New confusion  - Inability to wake or stay awake  - Pale, gray, or blue-colored skin, lips, or nail beds, depending on skin tone  This list is not all possible symptoms. Please contact your doctor for any other symptoms that are severe or concerning to you.     The above plan of care, diagnosis, orders, and follow-up were discussed with the patient, and the patient demonstrated full understanding. Questions related to this recommended plan of care were answered.    Acquanetta Chain MD, MS  Family Medicine/Immediate Care

## 2023-10-19 ENCOUNTER — Ambulatory Visit: Payer: TRICARE (CHAMPUS)

## 2023-10-19 ENCOUNTER — Telehealth: Payer: TRICARE (CHAMPUS)

## 2023-10-19 NOTE — Telephone Encounter
Faxed forms to [facility] [place] [facility] to [phone] & [phone] Thank you

## 2023-10-28 ENCOUNTER — Ambulatory Visit: Payer: TRICARE (CHAMPUS)

## 2023-10-28 DIAGNOSIS — C7A8 Other malignant neuroendocrine tumors: Secondary | ICD-10-CM

## 2023-10-28 DIAGNOSIS — R030 Elevated blood-pressure reading, without diagnosis of hypertension: Secondary | ICD-10-CM

## 2023-10-29 MED ORDER — AMLODIPINE BESYLATE 10 MG PO TABS
ORAL_TABLET | 0 refills | Status: AC
Start: 2023-10-29 — End: ?

## 2023-10-29 NOTE — Telephone Encounter
I spent a total of 15 minutes with this established patient cumulatively within the 7 days of the MyChart message date.   Topics of my discussion, including but not limited to evaluation/assessment/management of the patient complaint are noted in the MyChart message exchange(s).    Total time spent may include providing evaluation and advice, responding to the patient, documenting the encounter, placing orders, and reviewing relevant labs and/or prior notes, if applicable.     1. Elevated blood pressure reading        Orders Placed This Encounter    amLODIPine 10 mg tablet       Janean Sark  10/28/2023

## 2023-10-30 ENCOUNTER — Telehealth: Payer: TRICARE (CHAMPUS)

## 2023-10-31 NOTE — Telephone Encounter
Faxed MD Order to [facility] [phone]. Thank you

## 2023-11-04 MED ADMIN — LANREOTIDE ACETATE 120 MG/0.5ML SC SOLN: 120 mg | SUBCUTANEOUS | @ 22:00:00 | Stop: 2023-11-04

## 2023-11-06 ENCOUNTER — Other Ambulatory Visit: Payer: TRICARE (CHAMPUS)

## 2023-11-08 DIAGNOSIS — R296 Repeated falls: Secondary | ICD-10-CM

## 2023-11-09 ENCOUNTER — Telehealth: Payer: TRICARE (CHAMPUS)

## 2023-11-09 NOTE — Telephone Encounter
Faxed to [facility] [place] [facility] to [phone]. Thank you

## 2023-11-12 ENCOUNTER — Other Ambulatory Visit: Payer: TRICARE (CHAMPUS)

## 2023-11-13 ENCOUNTER — Telehealth: Payer: TRICARE (CHAMPUS)

## 2023-11-13 NOTE — Telephone Encounter
Faxed to [facility] [place] [facility] to [phone]. Thank you

## 2023-11-14 ENCOUNTER — Ambulatory Visit: Payer: TRICARE (CHAMPUS)

## 2023-11-14 DIAGNOSIS — C7A8 Other malignant neuroendocrine tumors: Secondary | ICD-10-CM

## 2023-11-18 ENCOUNTER — Telehealth: Payer: TRICARE (CHAMPUS)

## 2023-11-18 NOTE — Telephone Encounter
Faxed to [facility] [place] [facility] to [phone]. Thank you

## 2023-11-19 ENCOUNTER — Telehealth: Payer: TRICARE (CHAMPUS)

## 2023-11-19 NOTE — Telephone Encounter
Faxed both physician forms to [name] to [phone]. Thank you

## 2023-11-25 ENCOUNTER — Other Ambulatory Visit: Payer: TRICARE (CHAMPUS)

## 2023-11-29 ENCOUNTER — Inpatient Hospital Stay: Payer: MEDICARE | Attending: Gastroenterology

## 2023-11-29 ENCOUNTER — Ambulatory Visit: Payer: MEDICARE

## 2023-11-29 ENCOUNTER — Inpatient Hospital Stay: Payer: MEDICARE

## 2023-11-29 DIAGNOSIS — C7A8 Other malignant neuroendocrine tumors: Secondary | ICD-10-CM

## 2023-11-29 DIAGNOSIS — M25531 Pain in right wrist: Secondary | ICD-10-CM

## 2023-11-29 DIAGNOSIS — F0390 Unspecified dementia without behavioral disturbance: Secondary | ICD-10-CM

## 2023-11-29 MED ADMIN — IODIXANOL 320 MG/ML IV SOLN: 90 mL | INTRAVENOUS | @ 21:00:00 | Stop: 2023-11-29

## 2023-11-29 MED ADMIN — COPPER CU 64 DOTATATE 1 MCI/ML IV SOLN: 4.5 | INTRAVENOUS | @ 21:00:00 | Stop: 2023-11-29

## 2023-11-29 NOTE — Unmapped
R.I.C.E. (Rest, Ice, Compression, Elevation) - Video   This video will teach viewers how RICE is used to treat joint injuries, as well as give them tips on how to correctly apply the techniques at home.   To view the video go to this web address:   https://bit.ly/4dBV1yK   Or, scan this QR code with your smart phone      ? [employer]

## 2023-11-29 NOTE — Unmapped
[id number] en   RICE       Rest an injury, elevate it, and use ice and compression as directed.    RICE stands for rest, ice, compression, and elevation. These can limit pain and swelling after an injury. RICE may be helpful if you have a broken bone, sprain, strain, bruise, or bump.    Home care   Here are the details of RICE:   ?  Rest. Limit the use of the injured body part. This helps prevent further damage and gives the area time to heal. In some cases, you may need a sling, brace, splint, or cast to help keep the body part still until it has healed.   ?  Ice. Applying ice right after an injury helps with pain and swelling. To make an ice pack, put ice cubes in a plastic bag. Wrap the bag in a clean, thin towel or cloth. Then place it over the injured area. Do this for 15 to 20 minutes every 2 to 3 hours. Continue for the next 1 to 2 days or until your symptoms improve. Never put ice directly on your skin. Don't ice an area longer than 20 minutes at a time.   ?  Compression. Putting pressure on an injury helps reduce swelling and provides support. Wrap the injured area firmly with an elastic bandage or wrap. Make sure not to wrap the bandage too tightly so that you don't cut off blood flow to the area. If the bandage loosens, rewrap it.   ?  Elevation. Keeping an injury raised above the level of your heart reduces swelling, pain, and throbbing. For instance, if you have a broken leg, it may help to rest your leg on several pillows when sitting or lying down. Try to keep the injured area elevated as often as possible.       Follow-up care   Follow up with your health care provider as advised.       When to seek medical advice   Call your health care provider right away if:   ?  You have fever of 100.4?F (38?C) or higher, or as directed by your health care provider.   ?  You have chills.   ?  Pain or swelling gets worse.   ?  The area is cold, blue, numb, or tingly.   ?  You have signs of infection. These include warmth, redness, drainage, or a bad smell coming from the area.   ?  You have new symptoms.     Last Reviewed Date: 07/23/2023 00:00:00   ? 2000-2025 [employer], [place]. All rights reserved. This information is not intended as a substitute for professional medical care. Always follow your healthcare professional's instructions.

## 2023-11-29 NOTE — Progress Notes
PATIENT: Jodi Bentley  MRN: [id number]  DOB: 1941-10-09  DATE OF SERVICE: 11/29/2023    CHIEF COMPLAINT:   Chief Complaint   Patient presents with    Fall     Right wrist pain, after fall        HPI   Jodi Bentley is a 83 y.o. female presents for   Chief Complaint   Patient presents with    Fall     Right wrist pain, after fall     Additional history provided by daughter.  Yesterday was sitting on edge of bed and slid off the bed. Braced her fall with her hands. Today noticed some swelling in the right wrist. Has ome pain in the wrist.    Prob List     Patient Active Problem List   Diagnosis    History of fall    Other malignant neuroendocrine tumors (HCC/RAF)    Balance problem    Impaired mobility    Severe hearing loss    Unspecified mood (affective) disorder (HCC/RAF)    GAD (generalized anxiety disorder)    Mild major depression    OSA (obstructive sleep apnea)    Dysautonomia (HCC/RAF)    Dizziness    Abnormal brain MRI    Osteoporosis without current pathological fracture    Major neurocognitive disorder (HCC/RAF)    Closed compression fracture of body of L1 vertebra (HCC/RAF)    Urinary tract infection due to extended-spectrum beta lactamase (ESBL) producing Escherichia coli     PMH     Past Medical History:   Diagnosis Date    Histoplasmosis      PSxH   No past surgical history on file.  ALL     Allergies   Allergen Reactions    Ace Inhibitors Cough     Other reaction(s): Other (See Comments)  cough  cough  cough      Sulfasalazine     Sulfa Antibiotics Rash and Other (See Comments)     MEDS     Medications that the patient states to be currently taking   Medication Sig    amLODIPine 10 mg tablet Take 10 mg once daily as needed for systolic blood pressure above 528 mmHg.    bisacodyl (DULCOLAX) 5 mg EC tablet Take 1 tablet (5 mg total) by mouth daily as needed for Constipation.    busPIRone 15 mg tablet Take 1 tablet (15 mg total) by mouth three (3) times daily. (Patient taking differently: Take 3 tablets (45 mg total) by mouth three (3) times daily.)    Cholecalciferol (VITAMIN D3) 10 mcg (400 units) CAPS Take by mouth daily.    lidocaine 5% patch APPLY 1 PATCH TOPICALLY DAILY (12 HOURS ON AND 12 HOURS OFF) TO LOWER BACK    LORATADINE 10 mg tablet TAKE 1 TABLET DAILY    pantoprazole 40 mg DR tablet Take 1 tablet (40 mg total) by mouth daily.    rivastigmine 4.5 mg capsule Take 1 capsule (4.5 mg total) by mouth two (2) times daily before meals.    venlafaxine 150 mg 24 hr capsule Take 2 capsules (300 mg total) by mouth daily.     [facility]   No family history on file.  Social History     Socioeconomic History    Marital status: Married   Tobacco Use    Smoking status: Former     Types: Cigarettes    Smokeless tobacco: Never     Social Determinants of Health  Financial Resource Strain: Low Risk  (12/23/2022)    Received from [facility] [place] and [address], [address] and Services [place]    Overall Physicist, medical Strain (CARDIA)     Difficulty of Paying Living Expenses: Not very hard     PHYSICAL EXAM      Last Recorded Vital Signs:    11/29/23 1510   BP: 171/94   Pulse: 66   Resp: 12   Temp: 36.3 ?C (97.3 ?F)   SpO2: 97%     There is no height or weight on file to calculate BMI.    System Check if normal Positive or additional negative findings   Constit  [x]  General appearance     Eyes  []  Conj/Lids []  Pupils  []  Fundi     HENMT  []  External ears/nose []  Otoscopy   []  Gross Hearing []  Nasal mucosa   []  Lips/teeth/gums []  Oropharynx    []  mucus membranes []  Head  Hard of hearing   Neck  []  Inspection/palpation []  Thyroid     Resp  []  Effort []  No Wheezing    []  Auscultation  []  No Crackles     CV  []  Rhythm/rate   []  No Murmurs   []  No LEE   []  JVP non-elevated    Normal pulses:   []  Radial []  Femoral  []  Pedal     Breast  []  Inspection []  Palpation     GI  []  No abd masses    []  tenderness   []  rebound/guarding   []  Liver/spleen []  Bowel Sounds     GU  M: []  Scrotum []  Penis []  Prostate   F:  []  External []  vaginal wall        []  Cervix  []  mucus        []  Uterus    []  Adnexa      Lymph  []  Neck []  Axillae []  Groin     MSK Specify site examined:    []  Inspect/palp []  ROM   []  Stability []  Strength/tone   Swelling and TTP over the right wrist. ROM is intact, but painful.      Skin  []  Inspection []  Palpation     Neuro  []  CN2-12 intact grossly   [x]  Alert and oriented   []  DTR      []  Muscle strength      []  Sensation   []  Gait/balance     Psych  [x]  Insight/judgement     [x]  Mood/affect    [x]  Gross cognition        LABS/STUDIES   I have:   [x]  Reviewed/ordered [x]  1 []  2 []  >= 3 unique laboratory, radiology, and/or diagnostic tests noted below    []  Reviewed []  1 []  2 []  >= 3 prior external notes and incorporated into patient assessment    []  Discussed management or test interpretation with external provider(s) as noted        A&P   Jodi Fraiser is a 83 y.o. female presenting for   Chief Complaint   Patient presents with    Fall     Right wrist pain, after fall         PROBLEM & ORDERS    ICD-10-CM    1. Right wrist pain  M25.531 XR wrist 4+views right          ASSESSMENT  #Acute Right Wrist Pain After Fall   Acute  - Check XR of right  wrist  - Discussed RICE  - ibuprofen/tylenol as needed for pain    This patient's overall risk has been judged to be Mild.    The above recommendation were discussed with the patient.  The patient has all questions answered satisfactorily and is in agreement with this recommended plan of care.    No follow-ups on file.     Future Appointments         Provider Department Visit Type    11/29/2023 3:45 PM Catheryn Bacon., MD [facility] Health Primary &Specialty Care [facility] RETURN    12/02/2023 4:00 PM Sarina Ser., MD [facility] Oncology 600 RETURN    12/02/2023 4:30 PM LAB SCHEDULE [facility] Oncology 600 LAB HEM ONC [place] 600    12/04/2023 1:30 PM Tacy Learn., MD [facility] Palliative Care VIDEO VISIT - NEW    12/30/2023 2:00 PM Sarina Ser., MD; LAB SCHEDULE [facility] Oncology 600 LAB HEM ONC [place] 600    01/27/2024 2:00 PM Sarina Ser., MD; LAB SCHEDULE [facility] Oncology 600 LAB HEM ONC [place] 600    02/03/2024 1:00 PM Myriam Forehand., MD [facility] Neurology RETURN    02/24/2024 2:00 PM Sarina Ser., MD; LAB SCHEDULE [facility] Oncology 600 LAB HEM ONC [place] 600    03/23/2024 2:00 PM Sarina Ser., MD; LAB SCHEDULE [facility] Oncology 600 LAB [place] [address] 600            Signed:   Su Ley, MD  11/29/2023 3:43 PM

## 2023-12-01 ENCOUNTER — Telehealth: Payer: MEDICARE

## 2023-12-01 NOTE — Progress Notes
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     HISTORY OF PRESENT ILLNESS:  Patient is an 83 year old female who has moved from [place] to [place] and is here for of followup. The patient initially presented with abdominal pain and weight loss. She was evaluated and found to have a tumor in the small bowel. She underwent resection on 11/07/2021, was found to have a grade 2 small bowel neuroendocrine tumor. Ki-67 3% to 20%; it was T4 N1, 2 out of 31 lymph nodes were positive. There was no lymphovascular, but there was perineural invasion. There was tumor at the radial or mesenteric margin. The patient has done well postoperatively. 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 She now presents for further evaluation and treatment options. She had an original CT, which showed the mesenteric mass with mesenteric tethering as far as I can tell, has not had since then.     04/22/23 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 Recommend correlation with prior imaging or MR abdomen with hepatobiliary agent (Eovist). No flushing and diarrhea. Negative FOBT.      08/08/23 PET/CT 10/10 1.  Interval increase in number, size, and intense DOTATATE uptake of innumerable ill-defined hepatic metastatic lesions; compatible with disease progression. If better visualization desired, can consider MRI abdomen with Eovist.2.  Worsening L1 vertebral body compression fracture and several new healing right rib fractures; both associated with moderate dotatate uptake, which may represent chronic/degenerative or posttraumatic changes. Correlate with trauma history and close attention on follow-up.3.  Elsewhere, no new DOTATATE avid metastatic disease.4.  Interval development of marked urinary bladder wall thickening; could represent cystitis. Correlate with urinalysis. No flushing.           10/10/23 Started lanreotide  #1 08/30/23, #2 09/29/23 in assisted living, daughter with her on the call.  Some weight loss. Occ diarrhea.  Decreased appetite.  To see [facility].     12/02/23 Since last visit has had fall and UTI and apparent decline. Lanreotide #3 11/04/23. PET/CT 2/9  1. Interval stability to increase in size with persistent intense DOTATATE uptake of the numerous ill-defined hepatic metastatic lesions. If clinically indicated size of the hepatic metastases may be better characterized with MRI.2. Stable to slight increase in small foci of dotatate expression that likely corresponds to soft tissue attenuation adjacent to the uterus with somewhat limited evaluation due to misregistration but suspicious for malignant deposits.3. Stable to moderate to severe wedge compression deformity of the L1 vertebral body with decreased radiotracer uptake and interval healing of right rib fractures as described above. 4. Interval decrease in the urinary bladder wall thickening and enhancement and decreased heterogeneous enhancement of the bilateral kidneys likely sequela of prior infection. Clinical correlation is suggested. Cr increased slightly. Weight trending down over 10 lbs since 08/2023. Continues to report both diarrhea and constipation. No flushing or vomiting. More diarrhea.       Subjective:       Past Medical History:   Diagnosis Date    Histoplasmosis      No past  surgical history on file.  Social History     Socioeconomic History    Marital status: Married   Tobacco Use    Smoking status: Former     Types: Cigarettes    Smokeless tobacco: Never     Social Determinants of Psychologist, prison and probation services Strain: Low Risk  (12/23/2022)    Received from [facility] [place] [facility] [place], [address] [facility] [place] [facility] [place]    Overall Physicist, medical Strain (CARDIA)     Difficulty of Paying Living Expenses: Not very hard     No family history on file.  Current Outpatient Medications   Medication Sig    amLODIPine 10 mg tablet Take 10 mg once daily as needed for systolic blood pressure above 604 mmHg.    bisacodyl (DULCOLAX) 5 mg EC tablet Take 1 tablet (5 mg total) by mouth daily as needed for Constipation.    busPIRone 15 mg tablet Take 1 tablet (15 mg total) by mouth three (3) times daily. (Patient taking differently: Take 3 tablets (45 mg total) by mouth three (3) times daily.)    Cholecalciferol (VITAMIN D3) 10 mcg (400 units) CAPS Take by mouth daily.    lidocaine 5% patch APPLY 1 PATCH TOPICALLY DAILY (12 HOURS ON AND 12 HOURS OFF) TO LOWER BACK    LORATADINE 10 mg tablet TAKE 1 TABLET DAILY    pantoprazole 40 mg DR tablet Take 1 tablet (40 mg total) by mouth daily.    rivastigmine 4.5 mg capsule Take 1 capsule (4.5 mg total) by mouth two (2) times daily before meals.    venlafaxine 150 mg 24 hr capsule Take 2 capsules (300 mg total) by mouth daily.     No current facility-administered medications for this visit.      Allergies   Allergen Reactions    Ace Inhibitors Cough     Other reaction(s): Other (See Comments)  cough  cough  cough      Sulfasalazine     Sulfa Antibiotics Rash and Other (See Comments)     Wt Readings from Last 3 Encounters:   11/04/23 58.1 kg (128 lb)   10/07/23 60.3 kg (133 lb)   08/30/23 63 kg (139 lb)       Review of Systems  Constitutional: negative for anorexia, fatigue and weight loss, level of energy  Eyes: negative for pain, diplopia, dryness  Ears, nose, mouth, throat, and face: negative for ear drainage, epistaxis, hearing loss, hoarseness, nasal congestion, sore mouth and sore throat  Respiratory: negative for cough, dyspnea on exertion and sputum  Cardiovascular: negative for chest pain, exertional chest pressure/discomfort and palpitations  Gastrointestinal: negative for abdominal pain, constipation, diarrhea, dysphagia, nausea, reflux symptoms and vomiting  Integument: negative for dryness, pruritus, rash, skin color change and skin lesion(s)  Hematologic/lymphatic: negative for bleeding, easy bruising, lymphadenopathy and petechiae  Musculoskeletal:negative for arthralgias, muscle weakness and myalgias  Neurological: negative for headaches, memory problems and speech problems  Behavioral/Psych: negative for anxiety and depression  Endocrine: negative for temperature intolerance  Allergic/Immunologic:   Allergies   Allergen Reactions    Ace Inhibitors Cough     Other reaction(s): Other (See Comments)  cough  cough  cough      Sulfasalazine     Sulfa Antibiotics Rash and Other (See Comments)          Objective:        Vitals:BP 105/65  ~ Pulse 76  ~ Temp 36 ?C (  96.8 ?F) (Forehead)  ~ Resp 18  ~ Ht 167 cm (5' 5.75'')  ~ Wt 59 kg (130 lb 1.1 oz)  ~ SpO2 97%  ~ BMI 21.16 kg/m?      Wt Readings from Last 1 Encounters:   11/04/23 58.1 kg (128 lb)         General: Appears well-developed, well-nourished and close to stated age.   Head: Normocephalic, atraumatic.  Eyes: PERRL without icterus.   ENT: Oropharynx is clear, mucus membranes are moist.  No oral ulcers noted. Good dentition.  Neck: Supple. Trachea midline. No lymphadenopathy  CV: Regular in rate and rhythm, no murmurs or gallops. PMI nondisplaced.   Chest: Clear to auscultation bilaterally without wheezing or rhonchi. No crackles noted. Respiratory effort appears normal.   Abdomen: Soft, nontender and nondistended. Bowel sounds are present and normoactive. No organomegaly is appreciated.   Musculoskeletal: No edema. No cyanosis. Extremities are warm and well-perfused.   Neurologic: Gait appears normal. Oriented to person and place.  Hematologic: No bruising, purpura or petechiae are noted. Dermatologic: No rashes appreciated. No tumors noted.  Lymphatic: No palpable cervical, supraclavicular, axillary or inguinal adenopathy appreciated.   Psychiatric: Affect appropriate.  Pleasant and conversant.      ECOG:3    Lab Review:    Results for orders placed or performed in visit on 11/04/23   CBC & Plt & Differential   Result Value Ref Range    WBC (LabDAQ) 8.69 3.98 - 10.04 10??/uL    RBC (LabDAQ) 3.83 (L) 3.90 - 6.10 10x6/uL    Hemoglobin (LabDAQ) 11.6 11.2 - 15.7 g/dL    Hematocrit (LabDAQ) 35.8 34.1 - 44.9 %    MCV (LabDAQ) 93.5 79.0 - 94.8 fL    MCH (LabDAQ) 30.3 25.6 - 32.2 pg    MCHC (LabDAQ) 32.4 32.2 - 36.5 g/dL    RDW-CV (LabDAQ) 08.6 (H) 11.6 - 14.4 %    Platelets (LabDAQ) 163 163 - 369 10??/uL    MPV (LabDAQ) 10.6 9.4 - 12.4 fL    Neutrophil % (LabDAQ) 63.2 34.0 - 71.1 %    Lymphocyte % (LabDAQ) 24.4 19.3 - 53.1 %    Monocyte % (LabDAQ) 8.9 4.7 - 12.5 %    Eosinophil % (LabDAQ) 2.5 0.7 - 7.0 %    Basophil % (LabDAQ) 0.8 0.1 - 1.2 %    Neutrophil # (LabDAQ) 5.49 1.56 - 6.13 10??/uL    Lymphocyte # (LabDAQ) 2.12 1.18 - 3.74 10??/uL    Monocyte # (LabDAQ) 0.77 0.24 - 0.86 10??/uL    Eosinophil # (LabDAQ) 0.22 0.04 - 0.54 10??/uL    Basophil # (LabDAQ) 0.07 0.01 - 0.08 10??/uL    IG% (LabDAQ) 0.2 0.0 - 0.5 %    IG# (LabDAQ) 0.02 0.00 - 0.03 10x3/uL    Narrative    NOTE: IG = Immature Granulocytes     Results for orders placed or performed in visit on 11/04/23   Comprehensive Metabolic Panel, Serum   Result Value Ref Range    Sodium 142 135 - 146 mmol/L    Potassium 3.9 3.6 - 5.3 mmol/L    Chloride 105 96 - 106 mmol/L    Total CO2 26 20 - 30 mmol/L    Anion Gap 11 8 - 19 mmol/L    Glucose 100 (H) 65 - 99 mg/dL    Creatinine 5.78 (H) 0.60 - 1.30 mg/dL    Estimated GFR 35 See GFR Additional Information mL/min/1.66m2    GFR Additional Information See  Comment     Urea Nitrogen 15 7 - 22 mg/dL    Calcium 9.1 8.6 - 16.1 mg/dL    Total Protein 6.3 6.1 - 8.2 g/dL    Albumin 4.1 3.9 - 5.0 g/dL Bilirubin,Total 0.4 0.1 - 1.2 mg/dL    Alkaline Phosphatase 82 37 - 133 U/L    Aspartate Aminotransferase 36 13 - 62 U/L    Alanine Aminotransferase 31 8 - 70 U/L     No results found for: ''CA125''  No results found for: ''CEAG'', ''CA199''         Other Tests Reviewed:  IMAGING:    No imaging has been resulted in the last 30 days     Assessment/Plan:     Metastatic NET:  Started somatostatin analog.  Unclear if having any symptoms from that or her neuroendocrine tumor.  Mild progression. Stool studies. To Dr. Louanne Belton [facility]. (Emailed)    Noland Fordyce Loss: May be multifactorial. Interested in seeing what geriatric palliative says    3.  Diarrhea: To GI. May consider scheduled Imodium. Stool tests ordered. Hat given.     I spent 30 minutes reviewing chart, face to face and coordinating care    I am providing longitudinal care for this serious problem of NET.     Return in about 3 months (around 02/29/2024).    Attestation:  Orthoptist:  I, Danie Chandler have scribed for [employer]. Michele Mcalpine, MD with the documentation for Kaelen Brennan on the date of service noted above. All documentation underwent a comprehensive review by Dr. Michele Mcalpine and received approval upon signing.    Author: Tye Maryland. Michele Mcalpine, MD     J. Braulio Bosch, MD

## 2023-12-01 NOTE — Telephone Encounter
Faxed to [facility] to [phone]. Thank you

## 2023-12-02 ENCOUNTER — Other Ambulatory Visit: Payer: MEDICARE

## 2023-12-02 ENCOUNTER — Ambulatory Visit: Payer: MEDICARE | Attending: Gastroenterology

## 2023-12-02 DIAGNOSIS — R197 Diarrhea, unspecified: Secondary | ICD-10-CM

## 2023-12-02 NOTE — Patient Instructions
Start around the clock imodium for diarrhea, monitor closely for constipation.  Referral to Gastroenterology Dr. Caren Hazy.

## 2023-12-03 ENCOUNTER — Other Ambulatory Visit: Payer: MEDICARE

## 2023-12-03 ENCOUNTER — Telehealth: Payer: MEDICARE

## 2023-12-03 MED ADMIN — LANREOTIDE ACETATE 120 MG/0.5ML SC SOLN: 120 mg | SUBCUTANEOUS | @ 01:00:00 | Stop: 2023-12-03 | NDC 15054112004

## 2023-12-03 NOTE — Telephone Encounter
Please review

## 2023-12-03 NOTE — Telephone Encounter
Call Back Request      Reason for call back: Pt daughter would like to possibly reschedule pt appt. Please call her back to assist.     Any Symptoms:  []  Yes  [x]  No      If yes, what symptoms are you experiencing:    Duration of symptoms (how long):    Have you taken medication for symptoms (OTC or Rx):      If call was taken outside of clinic hours:    [] Patient or caller has been notified that this message was sent outside of normal clinic hours.     [] Patient or caller has been warm transferred to the physician's answering service. If applicable, patient or caller informed to please call us back if symptoms progress.  Patient or caller has been notified of the turnaround time of 1-2 business day(s).

## 2023-12-03 NOTE — Telephone Encounter
Spoke to pt's daughter and she will keep her video  appointment for tomorrow 12/04/2023.     Thank you kindly,   Justice Rocher. Evonnie Pat - Civil engineer, contracting

## 2023-12-04 ENCOUNTER — Ambulatory Visit: Payer: MEDICARE | Attending: Student in an Organized Health Care Education/Training Program

## 2023-12-04 ENCOUNTER — Other Ambulatory Visit: Payer: MEDICARE

## 2023-12-04 NOTE — Progress Notes
OUTPATIENT NEUROPALLIATIVE CARE / NEURO SUPPORTIVE CARE VISIT  MRN: [id number] CSN: [id number]     Referring Provider: Dr. Lawana Pai  Reason for Consult: Support for patient/family  Primary Diagnosis leading to Consult: Neurologic/Stroke/Neurodegenerative  Neuropalliative Diagnosis: Dementia    Patient Consent to Telehealth   The patient agreed to participate in the video visit prior to joining the visit.      HISTORY OF PRESENT ILLNESS  Jodi Bentley is a 83 y.o. female, with Alzheimer's Disease and metastatic NET, and other past medical history as noted, presents to neuropalliative care clinic.     Presents via video w daughter Jodi Bentley. Contributes to discussion but cannot provide medical history. Jodi Bentley notes that Dr. Michele Mcalpine reports that the growth is very slow and plan is to continue current therapy. Jodi Bentley feels that her symptoms are impacting her QOL.     Sx: No pain. Has significant anxiety which can cause HTN up to SBP 200 to 220. Also has fluctuating constipation, diarrhea, and fatigue, as well as depression/fatigue. Has frequent falls. She is having some frequent soft stools and Jodi Bentley believes she is receiving immodium. She is on the memory wing of the [facility]. Fatigue has been an issue for many years.     For her anxiety, she has taken a variety of medications over the years, currently on venlafaxine and buspar, and finds this helpful. Sees psychiatrist Dr. Areta Haber in [facility].     Jodi Bentley is her verbally appointed health surrogate. AHCD lists husband, but there is no conflict on decision making.     Psychosocial history: Grew up in [place], and was a Runner, broadcasting/film/video, then [employer] in [employer], was an Midwife. Likes to look forward to events--for example will occasionally go out to see a friend.     She spontaneously shares that she does not want to have a prolonged EOL experience with AD, and defines her minimal acceptable quality of life as meaningful communication with loved ones.       Patient Active Problem List   Diagnosis    History of fall    Other malignant neuroendocrine tumors (HCC/RAF)    Balance problem    Impaired mobility    Severe hearing loss    Unspecified mood (affective) disorder (HCC/RAF)    GAD (generalized anxiety disorder)    Mild major depression    OSA (obstructive sleep apnea)    Dysautonomia (HCC/RAF)    Dizziness    Abnormal brain MRI    Osteoporosis without current pathological fracture    Major neurocognitive disorder (HCC/RAF)    Closed compression fracture of body of L1 vertebra (HCC/RAF)    Urinary tract infection due to extended-spectrum beta lactamase (ESBL) producing Escherichia coli         Reviewed Notes  I reviewed these specialist notes that directly relate to the patient's acute and/or chronic medical problems and incorporated them into the patient assessment      Current Outpatient Medications   Medication Sig    amLODIPine 10 mg tablet Take 10 mg once daily as needed for systolic blood pressure above 782 mmHg.    bisacodyl (DULCOLAX) 5 mg EC tablet Take 1 tablet (5 mg total) by mouth daily as needed for Constipation.    busPIRone 15 mg tablet Take 1 tablet (15 mg total) by mouth three (3) times daily. (Patient taking differently: Take 3 tablets (45 mg total) by mouth three (3) times daily.)    Cholecalciferol (VITAMIN D3) 10 mcg (400 units) CAPS  Take by mouth daily.    lidocaine 5% patch APPLY 1 PATCH TOPICALLY DAILY (12 HOURS ON AND 12 HOURS OFF) TO LOWER BACK    LORATADINE 10 mg tablet TAKE 1 TABLET DAILY    pantoprazole 40 mg DR tablet Take 1 tablet (40 mg total) by mouth daily.    rivastigmine 4.5 mg capsule Take 1 capsule (4.5 mg total) by mouth two (2) times daily before meals.    venlafaxine 150 mg 24 hr capsule Take 2 capsules (300 mg total) by mouth daily.     No current facility-administered medications for this visit.     Facility-Administered Medications Ordered in Other Visits   Medication Dose Route Frequency    [COMPLETED] Copper Cu-64 dotatate (Detectnet) inj 4.5 millicurie  4.5 millicurie Intravenous Once    [COMPLETED] iodixanol (Visipaque) 320 mg/mL inj 90 mL  90 mL Intravenous Once    [COMPLETED] lanreotide acetate 120 mg/0.5 mL inj 120 mg  120 mg Subcutaneous Once       ALLERGIES  Ace inhibitors, Sulfasalazine, and Sulfa antibiotics    PHYSICAL EXAM  There were no vitals taken for this visit.  PHYSICAL EXAM VIA TELEMEDICINE   Physical Exam by Observation Only due to telemedicine visit   General: alert, awake, and no acute distress  Lungs: regular breathing pattern, no use of accessory muscles, and no cyanosis  Psych: pleasant, cooperative, and engaging in conversation  Neuro: Speech is tangential. Cannot provide medical details. Some perseveration.     LABS AND STUDIES  I have:   []  Reviewed/ordered []  1 [x]  2 []  >= 3 unique laboratory, radiology, and/or diagnostic tests noted below    []  Discussed management or test interpretation with external provider(s) as noted       Lab Studies:  CBC:   Results for orders placed or performed in visit on 12/02/23   CBC & Plt & Differential   Result Value Ref Range    WBC (LabDAQ) 9.82 3.98 - 10.04 10??/uL    RBC (LabDAQ) 3.83 (L) 3.90 - 6.10 10x6/uL    Hemoglobin (LabDAQ) 11.7 11.2 - 15.7 g/dL    Hematocrit (LabDAQ) 35.9 34.1 - 44.9 %    MCV (LabDAQ) 93.7 79.0 - 94.8 fL    MCH (LabDAQ) 30.5 25.6 - 32.2 pg    MCHC (LabDAQ) 32.6 32.2 - 36.5 g/dL    RDW-CV (LabDAQ) 16.1 (H) 11.6 - 14.4 %    Platelets (LabDAQ) 177 163 - 369 10??/uL    MPV (LabDAQ) 10.1 9.4 - 12.4 fL    Neutrophil % (LabDAQ) 67.4 34.0 - 71.1 %    Lymphocyte % (LabDAQ) 20.5 19.3 - 53.1 %    Monocyte % (LabDAQ) 7.6 4.7 - 12.5 %    Eosinophil % (LabDAQ) 3.6 0.7 - 7.0 %    Basophil % (LabDAQ) 0.6 0.1 - 1.2 %    Neutrophil # (LabDAQ) 6.62 (H) 1.56 - 6.13 10??/uL    Lymphocyte # (LabDAQ) 2.01 1.18 - 3.74 10??/uL    Monocyte # (LabDAQ) 0.75 0.24 - 0.86 10??/uL    Eosinophil # (LabDAQ) 0.35 0.04 - 0.54 10??/uL    Basophil # (LabDAQ) 0.06 0.01 - 0.08 10??/uL    IG% (LabDAQ) 0.3 0.0 - 0.5 %    IG# (LabDAQ) 0.03 0.00 - 0.03 10x3/uL    Narrative    NOTE: IG = Immature Granulocytes       CMP:   Results for orders placed or performed in visit on 12/02/23   Comprehensive Metabolic Panel  Result Value Ref Range    Sodium (LabDAQ) 141 136 - 145 mmol/L    Potassium (LabDAQ) 3.57 3.50 - 5.10 mmol/L    Chloride (LabDAQ) 104.0 98.0 - 107.0 mmol/L    Carbon Dioxide (LabDAQ) 29.5 21.0 - 31.0 mEq/L    Creatinine (LabDAQ) 1.3 0.6 - 1.3 mg/dL    BUN (LabDAQ) 60.4 7.0 - 25.0 mg/dL    Glucose (LabDAQ) 98 70 - 105 mg/dL    Alkaline Phosphatase (LabDAQ) 66 34 - 104 u/L    AST (LabDAQ) 14 13 - 39 u/L    ALT (LabDAQ) 16 7 - 52 u/L    Total Bilirubin (LabDAQ) 0.42 0.30 - 1.00 mg/dL    Total Protein (LabDAQ) 5.8 4.6 - 8.9 g/dL    Albumin (LabDAQ) 3.8 2.8 - 5.7 g/dL    Calcium (LabDAQ) 8.3 (L) 8.6 - 10.3 mg/dL    eGFR (LabDAQ) 54.0 JW/JXB/1.47W        No results found for requested labs within last 90 days.       Imaging Studies:   Last MRI Brain: No MRI Brain in the last year      IMPRESSION  Abrina Petz is a 83 y.o. female, with Alzheimer's Disease and metastatic NET, and other past medical history as noted, presents to neuropalliative care clinic.     #Anxiety and depression:   -Continue buspar and venlafaxine   -I have reached out to Dr. Areta Haber to request follow up     #Diarrhea/constipation  -Record stool frequency and consistency and medications used so we may work on this sx    #Advance Care Planning:   -Has AHCD in chart and lists husband as DPOA. Daughter Jodi Bentley present today and Jodi Bentley identifies her as health surrogate. Jodi Bentley shares she would make decisions jointly with husband.   -Jodi Bentley shares her concerns that she may live to a quality of life that is unacceptable to her due to her AD and asks about how to avoid this. Discussed restrictions around [facility] access, but that advance care planning can be used to reduce life prolonging care in undesirable states, which she would like to plan for. She defines her minimal acceptable quality of life as retaining the ability to have meaningful communication with loved ones.   -Jodi Bentley is unable to provide medical history but has provided her philosophy of care. Will meet with Jodi Bentley to discuss contingency planning in such circumstances.   -----    I am managing the serious complex conditions discussed above longitudinally for this patient and assuming the associated risk of management over time.  80 minutes were spent personally by me today on this encounter which may include today's pre-visit review of the chart, time spent during the visit, and today's time spent after the visit documenting and coordinating care.  35 minutes were spent discussing goals of care.         Electronically signed by:    Othelia Pulling, MD  Departments of Neurology and Palliative Care

## 2023-12-04 NOTE — Goals of Care
Advance Care Planning   Goals of Care       Met with  patient and daughter [name]  in [facility].  For details of discussion please see progress note from today.    -Has AHCD in chart and lists husband as DPOA. Daughter [name] present today and Majel Homer identifies her as health surrogate. [name] shares she would make decisions jointly with husband.   -Majel Homer shares her concerns that she may live to a quality of life that is unacceptable to her due to her AD and asks about how to avoid this. Discussed restrictions around [facility] access, but that advance care planning can be used to reduce life prolonging care in undesirable states, which she would like to plan for. She defines her minimal acceptable quality of life as retaining the ability to have meaningful communication with loved ones.   -[name] is unable to provide medical history but has provided her philosophy of care. Will meet with [name] to discuss contingency planning in such circumstances.     Othelia Pulling MD  Neurology and Palliative Care

## 2023-12-06 ENCOUNTER — Ambulatory Visit: Payer: TRICARE (CHAMPUS) | Attending: Gastroenterology

## 2023-12-06 ENCOUNTER — Other Ambulatory Visit: Payer: TRICARE (CHAMPUS)

## 2023-12-06 ENCOUNTER — Telehealth: Payer: TRICARE (CHAMPUS)

## 2023-12-06 NOTE — Telephone Encounter
 PSR spoke with [name] and scheduled ADC intake on 4/30 at 1 pm with Benard Halsted.

## 2023-12-09 ENCOUNTER — Other Ambulatory Visit: Payer: TRICARE (CHAMPUS)

## 2023-12-10 ENCOUNTER — Other Ambulatory Visit: Payer: MEDICARE

## 2023-12-10 ENCOUNTER — Ambulatory Visit: Payer: MEDICARE

## 2023-12-10 ENCOUNTER — Telehealth: Payer: MEDICARE

## 2023-12-10 DIAGNOSIS — K8689 Other specified diseases of pancreas: Secondary | ICD-10-CM

## 2023-12-10 NOTE — Telephone Encounter
 Call Back Request      Reason for call back: Patient's daughter requesting assistance with moving patient's lab appointments from the [facility] location to [facility]. Please advise, thank you.    Any Symptoms:  []  Yes  [x]  No      If yes, what symptoms are you experiencing:    Duration of symptoms (how long):    Have you taken medication for symptoms (OTC or Rx):      If call was taken outside of clinic hours:    [] Patient or caller has been notified that this message was sent outside of normal clinic hours.     [] Patient or caller has been warm transferred to the physician's answering service. If applicable, patient or caller informed to please call us back if symptoms progress.  Patient or caller has been notified of the turnaround time of 1-2 business day(s).

## 2023-12-10 NOTE — Telephone Encounter
 Preferred phone number: [phone]     Next Appointment:   Visit date not found     Next Appointment with PCP:   Recent Visits  Date Type Provider Dept   08/13/23 Office Visit Sheilah Mins, Cyndia Skeeters., MD [facility]   06/12/23 Office Visit Sheilah Mins, Cyndia Skeeters., MD [facility]   03/22/23 Office Visit Sheilah Mins, Cyndia Skeeters., MD [facility]   03/12/23 Office Visit Janean Sark., MD [facility]   Showing recent visits within past 365 days and meeting all other requirements  Future Appointments  No visits were found meeting these conditions.  Showing future appointments within next 180 days and meeting all other requirements

## 2023-12-11 MED ORDER — PANCRELIPASE (LIP-PROT-AMYL) 36000-114000 UNITS PO CPEP
36000 [IU] | ORAL_CAPSULE | Freq: Three times a day (TID) | ORAL | 11 refills | Status: AC
Start: 2023-12-11 — End: ?

## 2023-12-11 NOTE — Telephone Encounter
 Spoke with daughter [name], assisted with scheduling Lanreotide injection appts for the next 3 months in [place].    [facility] staff ... Kindly cancel Lanreotide appointments at your location, pt requested to come to [facility].    Thank you,  Ariyanah Aguado  [facility] [place]

## 2023-12-12 ENCOUNTER — Other Ambulatory Visit: Payer: MEDICARE

## 2023-12-12 NOTE — Telephone Encounter
 Good Morning Team     Apts here in [facility] have been cancelled     Thank you

## 2023-12-18 ENCOUNTER — Telehealth: Payer: MEDICARE

## 2023-12-18 NOTE — Telephone Encounter
 Faxed to [name] to [phone]. Thank you

## 2023-12-19 ENCOUNTER — Ambulatory Visit: Payer: MEDICARE

## 2023-12-19 DIAGNOSIS — R443 Hallucinations, unspecified: Secondary | ICD-10-CM

## 2023-12-25 ENCOUNTER — Telehealth: Payer: MEDICARE

## 2023-12-25 NOTE — Telephone Encounter
Faxed to [name] to [phone] thank you

## 2023-12-28 ENCOUNTER — Telehealth: Payer: TRICARE (CHAMPUS)

## 2023-12-28 NOTE — Telephone Encounter
Faxed to [facility] to [phone]. Thank you

## 2023-12-30 ENCOUNTER — Ambulatory Visit: Payer: TRICARE (CHAMPUS) | Attending: Gastroenterology

## 2023-12-30 MED ADMIN — LANREOTIDE ACETATE 120 MG/0.5ML SC SOLN: 120 mg | SUBCUTANEOUS | @ 20:00:00 | Stop: 2023-12-30

## 2024-01-09 ENCOUNTER — Other Ambulatory Visit: Payer: MEDICARE

## 2024-01-13 NOTE — Telephone Encounter
 Noted! Thank you

## 2024-01-14 ENCOUNTER — Telehealth: Payer: MEDICARE

## 2024-01-14 NOTE — Telephone Encounter
Faxed to [name] to [phone] thank you

## 2024-01-15 ENCOUNTER — Telehealth: Payer: MEDICARE

## 2024-01-15 NOTE — Telephone Encounter
 Faxed to [name] to [phone] thank you

## 2024-01-25 ENCOUNTER — Telehealth: Payer: TRICARE (CHAMPUS)

## 2024-01-25 NOTE — Telephone Encounter
 Faxed Physician communication request to [facility] [place] [facility] to [phone]. Thank you

## 2024-01-27 ENCOUNTER — Other Ambulatory Visit: Payer: TRICARE (CHAMPUS)

## 2024-01-27 ENCOUNTER — Ambulatory Visit: Payer: TRICARE (CHAMPUS) | Attending: Gastroenterology

## 2024-01-27 MED ADMIN — LANREOTIDE ACETATE 120 MG/0.5ML SC SOLN: 120 mg | SUBCUTANEOUS | @ 20:00:00 | Stop: 2024-01-27

## 2024-01-27 NOTE — Addendum Note
 Addended by: Lysle Morales on: 01/27/2024 11:40 AM     Modules accepted: Orders

## 2024-01-30 ENCOUNTER — Other Ambulatory Visit: Payer: TRICARE (CHAMPUS)

## 2024-02-01 ENCOUNTER — Telehealth: Payer: TRICARE (CHAMPUS)

## 2024-02-01 NOTE — Telephone Encounter
 Faxed 2 Physician Communication forms to [name] thank you

## 2024-02-03 ENCOUNTER — Other Ambulatory Visit: Payer: TRICARE (CHAMPUS)

## 2024-02-03 ENCOUNTER — Ambulatory Visit: Payer: TRICARE (CHAMPUS) | Attending: Neurology

## 2024-02-03 DIAGNOSIS — G309 Alzheimer's disease, unspecified: Secondary | ICD-10-CM

## 2024-02-03 DIAGNOSIS — F0280 Dementia in other diseases classified elsewhere without behavioral disturbance: Secondary | ICD-10-CM

## 2024-02-03 NOTE — Progress Notes
 [facility]     PATIENT:  Jodi Bentley  MRN: [id number]  DOB:  10/04/1941  DATE OF SERVICE: 02/03/2024    PRIMARY CARE PROVIDER: Sheilah Mins, Cyndia Skeeters., MD    REASON FOR VISIT: follow-up for the evaluation of major nuerocognitive disorder    History of Present Illness   Jodi Bentley is a 83 y.o. woman with progressive cognitive decline here today fro further evaluation together with her husband and daughter.    Jodi Bentley is able to appreciate that things aren't quite where they should be butt does also not seem to appreciate her difficulties to the level that other's around her are. Concept of time impacted.    2020 first note made of some memory concerns in physician notes. Husband was noting some mild memory changes at that time. Also had some falls. Had a fall in autumn 2021 with small SDH. In the wake of that daughter feels there was more notable changes. In March 2022 after further work-up given a diagnosis of MCI 2022.     In March 23762 had a large volume LP NPH assessment. Has seen Dr Anselm Jungling since then     Worked as a Runner, broadcasting/film/video. Has a masters degree. Retired 22 years ago and was home with her children there after.     No notable alcohol history. No additional noted head trauma history. No known toxin exposures.     Takes Rivastigmine 3mg  BID  Takes 300mg  of Venlafaxine daily plus 15mg  TID of Buspirone    One of her first cousins with diagnosis of Alzheimer's disease. In retrospect Jodi Bentley recalls some memory changes in Jodi Bentley's mother. She had notable kyphosis which impacted breathing some also.     Jodi Bentley Feb 2024 and had a femoral fracture (tripped over her pant leg). Still in rehab - has been for the past 3 weeks. Is making progress as per her family.     FDG PET brain scan supporting an Alzheimer's etiology. We spoke about the previous findings on MRI  - that of hemorrhage and the history of SDH in 2021. Interestingly 2022 imaging report without mention of any old blood. Question of interval bleed. Daughter will get image from [facility] and we can have radiology compare to the current imaging. If new that may be further barrier to [place] if all dates back to 2021 then may still consider and could pursue amyloid PET scan.     On 02/22/23, she and her husband moved into an apartment on the early-Alzheimer's floor at [facility] [employer] (their retirement community). Jodi Bentley is appreciating some of her difficulties and made a statement that she feels she might have Alzheimer's disease in her future. We spoke again about the PET result and spent a lot of time talking about Lecanemab. Decision made not to pusure ginve the prior hemorrhage findings. She and her husband are now in separate living quarters now and that has helped. Jodi Bentley in Memory care. She is doing some of the activities and eats at least one of her meals with others. Daughter believes this has been helpful.     Has a neuroendocrine tumor that has shown some progression.    Has a lot of ongoing anxiety. Works with Dr Areta Haber.    Interval History 02/03/24:  Here to day for updated cognitive testing. At the end Vanassa got a little upset that she didn't really know what was going on - diagnosis etc. I did inform her we have talked about it in the past  but that it is not uncommon to forget some of those details. We did again speak abut the proposed diagnosis and she voiced an understanding.     Past Medical History  Past Medical History:   Diagnosis Date    Histoplasmosis      Medications   Outpatient Medications Prior to Visit   Medication Sig    amLODIPine 10 mg tablet Take 10 mg once daily as needed for systolic blood pressure above 578 mmHg.    bisacodyl (DULCOLAX) 5 mg EC tablet Take 1 tablet (5 mg total) by mouth daily as needed for Constipation.    busPIRone 15 mg tablet Take 1 tablet (15 mg total) by mouth three (3) times daily. (Patient taking differently: Take 3 tablets (45 mg total) by mouth three (3) times daily.)    Cholecalciferol (VITAMIN D3) 10 mcg (400 units) CAPS Take by mouth daily.    lidocaine 5% patch APPLY 1 PATCH TOPICALLY DAILY (12 HOURS ON AND 12 HOURS OFF) TO LOWER BACK    LORATADINE 10 mg tablet TAKE 1 TABLET DAILY    pancrelipase, Lip-Prot-Amyl, (CREON) 36000 units DR capsule Take 1 capsule (36,000 units of lipase total) by mouth three (3) times daily with meals.    pantoprazole 40 mg DR tablet Take 1 tablet (40 mg total) by mouth daily.    rivastigmine 4.5 mg capsule Take 1 capsule (4.5 mg total) by mouth two (2) times daily before meals.    venlafaxine 150 mg 24 hr capsule Take 2 capsules (300 mg total) by mouth daily.     No facility-administered medications prior to visit.     Allergies:   Allergies   Allergen Reactions    Ace Inhibitors Cough     Other reaction(s): Other (See Comments)  cough  cough  cough      Sulfasalazine     Sulfa Antibiotics Rash and Other (See Comments)       Family History:    No family history on file.    Social History:   Social History     Tobacco Use    Smoking status: Former     Types: Cigarettes    Smokeless tobacco: Never      Review of Systems:  Review of systems completed    Objective:     Last Recorded Vital Signs:    02/03/24 1348   BP: 116/66   Pulse: 67     General Examination (seated observation):  Head: NCAT  Eyes: EOMI  Respiratory: No noted respiratory distress  Back: Grossly normal curvature  Musculoskeletal. No clear restrictions to ROM in limbs    Mental Status: alert, cooperative. Overall seemed to follow the flow and conversation of the visit well. Hard of hearing however and much of what I said had to be repeated.    MOCA: 20/30 (from other note 10/17/22)  Deficits: unable to copy 3D shape, 2/3 on serial 7 subtraction, 0/1 language fluency (able to name 7 words), 0/5 delayed recall (with MIS 8/15), and 4/6 orientation (thought date was 26th instead of 27th, did not know day of week)    MOCA V7.1 (06/21/23): 19/30    MOCA (02/03/24): 16/30  Vision and hearing difficulty impeding some    Cranial Nerves:  III, IV, VI-conjugate gaze  VII-facial expression symmetric.    VIII-hearing intact.   IX, X-no dysarthria.     Motor Exam: symmetric bulk. Moves all 4 equally. No pronator drift  Coordination: no ataxia  Gait: steady  Lab Review: reviewed  B12 1216  TSH 5  Vit D and folate wnls    Imaging:   FDG PET Brain 12/13/22:  1. Abnormal distribution of FDG, most severely affecting uptake of bilateral posterior cingulate and parietotemporal cortices. This pattern would be most consistent with a progressive neuro-degenerative disorder, such as occurs in AD.     2. Structural changes demonstrating cerebral atrophy    MRI Brain 03/13/22:  1. No acute infarct or hemorrhage.  2. Lateral ventricles are slightly dilated out of proportion to the degree of atrophy, correlate for normal pressure hydrocephalus.  3. Hemosiderin staining along the right frontal lobe and smaller amount along left frontal lobe, likely from prior hemorrhage.  4. Generalized cerebral atrophy with asymmetrically increased atrophy of the parietal lobes.    MRI Brain 01/25/21:  There is generalized atrophy with enlargement of ventricles and   sulci. No acute hemorrhage, abnormal mass effect, restricted diffusion, or   other evidence of acute infarct is identified. The craniocervical junction   is unremarkable. There are no subdural collections. There is no evidence of   significant hemosiderin deposition.     NeuroQuant demonstrates hippocampal occupancy score below the 5th   percentile. Hippocampi volume is between the fifth and 25th percentiles.   Superior and inferior lateral ventricular volume are each well above the   95th percentile for age. Complete NeuroQuant data is available in PACS.     IMPRESSION: Cerebral atrophy without evidence of acute intracranial   abnormality. Although there is prominent dilation of lateral ventricles,   overall features do not favor NPH.   IMPRESSION:  Cerebral atrophy without evidence of acute intracranial   abnormality. Although there is prominent dilation of lateral ventricles,   overall features do not favor NPH.   Assessment:   Jonnelle Lawniczak is a 83 y.o. woman with progressive cognitive decline here today fro further evaluation together with her daughter.    In March 57846 had a large volume LP NPH assessment. Has seen Dr Wilhelmenia Harada since then     Takes Rivastigmine 4.5mg  BID.    Takes 300mg  of Venlafaxine daily plus 15mg  of Buspirone. She has a psychiatrist Dr Carlis Cherry managing/monitoring these meds.     FDG PET brain scan supporting an Alzheimer's etiology. We spoke about the previous findings on MRI  - that of hemorrhage and the history of SDH in 2021. Interestingly 2022 imaging report without mention of any old blood.     Updated MOCA assessment with score of 16/30 which is a change from Aug 2024 with score of 19/30. Delayed recall without    Lives at [name] in [facility]. She is doing some of the activities and eats atleast one of her meals with others. Daughter believes this has been helpful. No consider Lecanemab therapy.    Major neurocognitive disorder - Alzheimer's disease    Plan/ Recommendation:   Rivastigmine 4.5mg  BID  Continue with work on anxiety with Dr Izell Marsh about exercise (cognitive and physical)  RTC in 4-6 months    Author:  Doraine Gallon. Fredna Jasper, MD 02/03/2024

## 2024-02-11 ENCOUNTER — Other Ambulatory Visit: Payer: TRICARE (CHAMPUS)

## 2024-02-11 ENCOUNTER — Telehealth: Payer: TRICARE (CHAMPUS)

## 2024-02-11 NOTE — Telephone Encounter
 DCA placed a call to patient's spouse Kenneth, Lax . DCA advised they were calling on behalf of Panlilio, Yolonda Henderson., NP with [facility]. [facility] explained the reason for their call:  Comments:    DCA reached out to collect missing question from Zar[facility]. DCA also reached out to collect additional questions from CG demos.  Flowsheets entered.     GUIDE Initial Assessment (Tool/Questionnaire confirmation):    Insurance Type:  Medicare Part A&B  [facility]   Commercial/Other  [x]     []     []         Questionnaires, Tools, Flowsheets:  Confirmed/Noted in Flowsheet:   ADC e-PVQ completed/entered  [x]     Tools collected/entered on flowsheets:  NPI-Q  MCSI  Zarit Total score: # (pending 1 question)  HRSN (noted below)     [x]     [x]     []      [x]     E-Consents noted:   CBO Consent   GUIDE e-consent   GUIDE e-consent Not Applicable    [x]     [x]    []     Additional Flowsheet(s):   GUIDE Submission Flowsheet (pending questions)  Caregiver Demographics (collected)  Not Applicable    [x]     [x]    []        E-Consents:  Noted in 12/06/2023 MyChart encounter     HRSN data:  (Data currently not pulling into a flowsheet noted below)   or  (Data collected over the phone noted below)    Health Related Social Needs:     How often do you see or talk to people that you care about and feel close to?   []  Less than once a week  []  1-2 times a week  [x]  3-5 times a week  []  5 or more times a week  []  I chose not to answer this question.  How hard is it for you to pay for the very basics like food, housing, medical care, and heating?         [x]  Not hard at all      []  Not very hard  []  Somewhat hard  []  Very hard  []  Decline  Within the past 12 months, you worried whether your food would run out before you got money to buy more?          [x]  Never true   []  Sometimes true   []  Often True   []  I chose not to answer this question    Within the past 12 months, the food you bought just didn't last and you didn't have money to get more.           [x]  Never true   []  Sometimes true   []  Often true   [] I chose not to answer this question    Do you feel stress - tense, restless, nervous, or anxious, or unable to sleep at night because your mind is troubled all the time - these days?        []  Not at all    []  Only a little   []  To some extent   []  Rather much    [x]  Very much     On average, how many days per week do you engage in moderate to strenuous exercise (like a brisk walk)?           0 days per week  On average, how many minutes do you engage in exercise  at this level?           0 minutes   Within the past 12 months, has a lack of transportation kept you from medical appointments, meetings, work, or from getting things needed for daily living?           []  Yes   [x]  No   []  I chose not to answer this question  In the last 12 months, was there a time when you were not able to pay the mortgage or rent on time?           []  Yes    [x]  No   []  Decline  In the last 12 months, how many places have you lived?          2  In the last 12 months, was there a time when you did not have a steady place to sleep or slept in a shelter (including now)?          []  Yes   [x]  No   []  Decline  In the past 12 months has the electric, gas, oil, or water company threatened to shut off services in your home?           []  Yes   [x]  No   []  Already shut off   []  Decline    Valrie Gehrig  Dementia Care Assistant  On behalf of Annah Barre., NP  [facility]  [phone]

## 2024-02-12 ENCOUNTER — Ambulatory Visit: Payer: TRICARE (CHAMPUS) | Attending: Student in an Organized Health Care Education/Training Program

## 2024-02-12 DIAGNOSIS — Z7189 Other specified counseling: Secondary | ICD-10-CM

## 2024-02-12 DIAGNOSIS — R443 Hallucinations, unspecified: Secondary | ICD-10-CM

## 2024-02-12 DIAGNOSIS — G309 Alzheimer's disease, unspecified: Secondary | ICD-10-CM

## 2024-02-12 DIAGNOSIS — F0280 Dementia in other diseases classified elsewhere without behavioral disturbance: Secondary | ICD-10-CM

## 2024-02-12 NOTE — Progress Notes
 OUTPATIENT NEUROPALLIATIVE CARE / NEURO SUPPORTIVE CARE VISIT  MRN: [id number] CSN: [id number]     Referring Provider: Dr. Erlinda Haws  Reason for Consult: Support for patient/family  Primary Diagnosis leading to Consult: Neurologic/Stroke/Neurodegenerative  Neuropalliative Diagnosis: Dementia    Patient Consent to Telehealth   The patient agreed to participate in the video visit prior to joining the visit.      HISTORY OF PRESENT ILLNESS  Jodi Bentley is a 83 y.o. female, with Alzheimer's Disease and metastatic NET, and other past medical history as noted, presents to neuropalliative care clinic.     Presents via video w daughter Jodi Bentley. Contributes to discussion but cannot provide medical history. Jodi Bentley notes that Dr. Marylene Snuffer reports that the growth is very slow and plan is to continue current therapy. Jodi Bentley feels that her symptoms are impacting her QOL.     Sx: No pain. Has significant anxiety which can cause HTN up to SBP 200 to 220. Also has fluctuating constipation, diarrhea, and fatigue, as well as depression/fatigue. Has frequent falls. She is having some frequent soft stools and Jodi Bentley believes she is receiving immodium. [facility] the [facility]. Fatigue has been an issue for many years.     For her anxiety, she has taken a variety of medications over the years, currently on venlafaxine  and buspar, and finds this helpful. Sees psychiatrist Dr. Carlis Cherry in [facility].     Jodi Bentley is her verbally appointed health surrogate. AHCD lists husband, but there is no conflict on decision making.     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 Likes to look forward to events--for example will occasionally go out to see a friend.     She spontaneously shares that she does not want to have a prolonged EOL experience with AD, and defines her minimal acceptable quality of life as meaningful communication with loved ones.     Interval Events 02/12/24  -Has a lot of fluctuations in mental status--had a period of poor ambulation, significant fatigue. She was evaluated for UTI but this was negative. The staff at the nursing home noted she has had a decline. There are other days when she is doing well.   -She is maintaining her weight--using ensure drinks to supplement. At 130 pounds, which is his normal.   -No recent hospitalizations.  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 She also has a hallucination/ delusion that children are visiting and drawing on her paintings. She feels that children sleep on the furniture.   -She has frequent falls, more than 100 times, broke her hip once.   -Discussion regarding medical decision making today.      Patient Active Problem List   Diagnosis    History of fall    Other malignant neuroendocrine tumors (HCC/RAF)    Balance problem    Impaired mobility    Severe hearing loss    Unspecified mood (affective) disorder (HCC/RAF)    GAD (generalized anxiety disorder)    Mild major depression    OSA (obstructive sleep apnea)    Dysautonomia (HCC/RAF)    Dizziness    Abnormal brain MRI    Osteoporosis without current pathological fracture    Major neurocognitive disorder (HCC/RAF)    Closed compression fracture of body of L1 vertebra (HCC/RAF)    Urinary tract infection due to extended-spectrum beta lactamase (ESBL) producing Escherichia coli  Reviewed Notes  I reviewed these specialist notes that directly relate to the patient's acute and/or chronic medical problems and incorporated them into the patient assessment      Current Outpatient Medications   Medication Sig    amLODIPine  10 mg tablet Take 10 mg once daily as needed for systolic blood pressure above 161 mmHg.    bisacodyl (DULCOLAX) 5 mg EC tablet Take 1 tablet (5 mg total) by mouth daily as needed for Constipation.    busPIRone 15 mg tablet Take 1 tablet (15 mg total) by mouth three (3) times daily. (Patient taking differently: Take 3 tablets (45 mg total) by mouth three (3) times daily.)    Cholecalciferol (VITAMIN D3) 10 mcg (400 units) CAPS Take by mouth daily.    lidocaine  5% patch APPLY 1 PATCH TOPICALLY DAILY (12 HOURS ON AND 12 HOURS OFF) TO LOWER BACK    LORATADINE  10 mg tablet TAKE 1 TABLET DAILY    pancrelipase , Lip-Prot-Amyl, (CREON ) 36000 units DR capsule Take 1 capsule (36,000 units of lipase total) by mouth three (3) times daily with meals.    pantoprazole  40 mg DR tablet Take 1 tablet (40 mg total) by mouth daily.    rivastigmine  4.5 mg capsule Take 1 capsule (4.5 mg total) by mouth two (2) times daily before meals.    venlafaxine  150 mg 24 hr capsule Take 2 capsules (300 mg total) by mouth daily.     No current facility-administered medications for this visit.       ALLERGIES  Ace inhibitors, Sulfasalazine, and Sulfa antibiotics    PHYSICAL EXAM  There were no vitals taken for this visit.  PHYSICAL EXAM VIA TELEMEDICINE   Physical Exam by Observation Only due to telemedicine visit   General: alert, awake, and no acute distress  Lungs: regular breathing pattern, no use of accessory muscles, and no cyanosis  Psych: pleasant, cooperative, and engaging in conversation  Neuro: Speech is tangential. Cannot provide medical details. Some perseveration.     LABS AND STUDIES  I have:   []  Reviewed/ordered []  1 [x]  2 []  >= 3 unique laboratory, radiology, and/or diagnostic tests noted below    []  Discussed management or test interpretation with external provider(s) as noted       Lab Studies:  CBC:   Results for orders placed or performed in visit on 01/27/24   CBC   Result Value Ref Range    White Blood Cell Count 10.60 (H) 4.16 - 9.95 x10E3/uL    Red Blood Cell Count 3.83 (L) 3.96 - 5.09 x10E6/uL    Hemoglobin 11.3 (L) 11.6 - 15.2 g/dL    Hematocrit 09.6 04.5 - 45.2 %    Mean Corpuscular Volume 99.0 (H) 79.3 - 98.6 fL    Mean Corpuscular Hemoglobin 29.5 26.4 - 33.4 pg    MCH Concentration 29.8 (L) 31.5 - 35.5 g/dL Red Cell Distribution Width-SD 55.1 (H) 36.9 - 48.3 fL    Red Cell Distribution Width-CV 15.2 11.1 - 15.5 %    Platelet Count, Auto 216 143 - 398 x10E3/uL    Mean Platelet Volume 11.3 9.3 - 13.0 fL    Nucleated RBC%, automated 0.0 No Ref. Range %    Absolute Nucleated RBC Count 0.00 0.00 - 0.00 x10E3/uL    Neutrophil Abs (Prelim) 6.94 See Absolute Neut Ct. x10E3/uL   Differential, Automated   Result Value Ref Range    Neutrophil Percent, Auto 65.5 No Ref. Range %    Lymphocyte Percent, Auto 20.6  No Ref. Range %    Monocyte Percent, Auto 9.3 No Ref. Range %    Eosinophil Percent, Auto 3.2 No Ref. Range %    Basophil Percent, Auto 0.9 No Ref. Range %    Immature Granulocytes% 0.5 No Reference Range %    Absolute Neut Count 6.94 (H) 1.80 - 6.90 x10E3/uL    Absolute Lymphocyte Count 2.18 1.30 - 3.40 x10E3/uL    Absolute Mono Count 0.99 (H) 0.20 - 0.80 x10E3/uL    Absolute Eos Count 0.34 0.00 - 0.50 x10E3/uL    Absolute Baso Count 0.10 0.00 - 0.10 x10E3/uL    Absolute Immature Gran Count 0.05 (H) 0.00 - 0.04 x10E3/uL   CBC & Plt & Differential    Narrative    The following orders were created for panel order CBC & Plt & Differential.  Procedure                               Abnormality         Status                     ---------                               -----------         ------                     AVW[[id number]]                          Abnormal            Final result               Differential, Automated[[id number]]      Abnormal            Final result                 Please view results for these tests on the individual orders.       CMP:   Results for orders placed or performed in visit on 01/27/24   Comprehensive Metabolic Panel, Serum   Result Value Ref Range    Sodium 142 135 - 146 mmol/L    Potassium 3.8 3.6 - 5.3 mmol/L    Chloride 104 96 - 106 mmol/L    Total CO2 27 20 - 30 mmol/L    Anion Gap 11 8 - 19 mmol/L    Glucose 64 (L) 65 - 99 mg/dL    Creatinine 8.29 5.62 - 1.30 mg/dL    Estimated GFR 41 See GFR Additional Information mL/min/1.17m2    GFR Additional Information See Comment     Urea Nitrogen 15 7 - 22 mg/dL    Calcium 8.6 8.6 - 13.0 mg/dL    Total Protein 6.1 6.1 - 8.2 g/dL    Albumin 4.1 3.9 - 5.0 g/dL    Bilirubin,Total 0.4 0.1 - 1.2 mg/dL    Alkaline Phosphatase 98 37 - 133 U/L    Aspartate Aminotransferase 23 13 - 62 U/L    Alanine Aminotransferase 18 8 - 70 U/L        No results found for requested labs within last 90 days.       Imaging Studies:   Last MRI Brain: No MRI Brain in the last year  IMPRESSION  Jodi Bentley is a 83 y.o. female, with Alzheimer's Disease and metastatic NET, and other past medical history as noted, presents to neuropalliative care clinic.     #Anxiety and depression:   -Continue buspar and venlafaxine , is following with Dr. Carlis Cherry     #Hallucinations: vivid visual hallucinations and delusions of children in home, capgras delusion regarding husband  -Discussed trial of seroquel. Jodi Bentley will discuss with Jodi Bentley's husband as they are in particular concerned given Jodi Bentley's very frequent falls. If they do want to trial this, effects would be expected immediately so could be discontinued if balance worsens or overly sedating. Would start with dose of 12.5mg  nightly.       #Advance Care Planning:   -Has AHCD in chart and lists husband as DPOA. Daughter Jodi Bentley present today and Verda Gist identifies her as health surrogate. Jodi Bentley shares she would make decisions jointly with husband.   -Verda Gist shares her concerns that she may live to a quality of life that is unacceptable to her due to her AD and asks about how to avoid this. Discussed restrictions around [facility] access, but that advance care planning can be used to reduce life prolonging care in undesirable states, which she would like to plan for. She defines her minimal acceptable quality of life as retaining the ability to have meaningful communication with loved ones.   -Capacity: Jodi Bentley is unable to provide medical history but has provided her philosophy of care and can share her overall wishes.   -02/12/24: advance care planning discussion today regarding expected trajectory in Alzheimer's disease, and possible modes of care, including when Jodi Bentley might be eligible for hospice from progression of her disease versus a new acute issue. Jodi Bentley feels that there are some good days that Jodi Bentley contineus to have but overall, her current quality of life she believes likely is not acceptable to Jodi Bentley and Jodi Bentley would likely not want life prolonging care. Discussed electing for comfort care in setting of acute illness such as pneumonia in order to allow for natural passing and not prolong life.     No addressed:   #Diarrhea/constipation  -Record stool frequency and consistency and medications used so we may work on this sx    -----    I am managing the serious complex conditions discussed above longitudinally for this patient and assuming the associated risk of management over time.  40 minutes were spent personally by me today on this encounter which may include today's pre-visit review of the chart, time spent during the visit, and today's time spent after the visit documenting and coordinating care.  15 minutes were spent discussing goals of care.         Electronically signed by:    Waldemar Guillaume, MD  Departments of Neurology and Palliative Care

## 2024-02-13 ENCOUNTER — Other Ambulatory Visit: Payer: TRICARE (CHAMPUS)

## 2024-02-14 ENCOUNTER — Ambulatory Visit: Payer: TRICARE (CHAMPUS)

## 2024-02-14 MED ORDER — QUETIAPINE FUMARATE 25 MG PO TABS
12.5 mg | ORAL_TABLET | Freq: Every evening | ORAL | 3 refills | 30.00000 days | Status: AC
Start: 2024-02-14 — End: 2024-02-27

## 2024-02-19 ENCOUNTER — Ambulatory Visit: Payer: TRICARE (CHAMPUS)

## 2024-02-19 NOTE — H&P
 [facility]  GUIDE Comprehensive Assessment    Time Start: 1300    Date:  02/19/2024    Name:   Jodi Bentley  MRN: [id number]  DOB: [birth year] 83 y.o. Sex: female    Phone Number: [phone] (home)   Mailing address: [address] [place] [address]  Preferred Language: English  Home: [facility], [facility]    Primary Caregiver:   Name/Relationship:  Jodi Bentley,Jodi Bentley (daughter, caregiver, and healthcare decision-maker)   is involved in personal care  is involved in decision making  Informant Address: [address] [place], [place] [address]   Informant Phone: [phone]   Informant Email: [email or web address]   Best time to reach this person during business hours: any   Patient permission to contact this person: yes    Additional Caregivers:  Name/Relationship: Jodi Bentley, Husband and healthcare decision-maker  is involved in personal care  is involved in decision making  Mailing Address:   Phone Number: [phone]   Patient permission to contact this person:yes    Name/Relationship:  Jodi Bentley, Son and healthcare decision-maker  is not involved in personal care  is involved in decision making  Mailing Address: [place]   Phone Number: Yes   Patient permission to contact this person:yes    Care Team  Patient Care Team:  Jodi Bentley, Jodi Cordia., Jodi Bentley as PCP - General (Family Medicine)  Jodi Barre., Jodi Bentley as Dementia Care Specialist (Jodi Bentley) (Medicine, Geriatric Medicine)  Jodi Bentley, Jodi Bayard., Jodi Bentley, Jodi Bentley as Social Worker (Social Worker)  Jodi Bentley as Dementia Care Assistant  Jodi Bentley as Patient Service Representative   Jodi Bentley                                       Chief Complaint:  Jodi Bentley has been referred to the [facility]'s and Dementia Care Program by No ref. provider found for co-management of dementia-related issues and coordination of dementia care.     History of Present Illness: Jodi Bentley is an 83 y.o. female with Late Onset Alzheimer's Dementia with an estimated onset in  2020 .  Jodi Bentley is partially independent  with ADLs, and is dependent with IADLs. Present during the today's visit: daughter Jodi Bentley.   Patient is a []  reliable [x]  unreliable/limited historian due to dementia diagnosis.  Additional information was obtained from the pre-visit questionnaire.     Sleeping problems: [] yes [x] no [] unable to state  Constipation:  [] yes [x] no [] unable to state  Nutrition Concerns:  [] yes [x] no [] unable to state   Swallowing:   [] yes [x] no [] unable to state  Urinary incontinence: []  yes [x]  no []  unable to state   If yes, is the urinary incontinence new?  []  yes []  no []  unable to state  If yes, does the patient have UTI? []  yes []  no    If yes, is the patient on diuretic?  []  yes []  no   Bowel Incontinence: [] yes [x] no [] unable to state    Otherwise, the patient and her daughter are present in person to establish care in the [facility] GUIDE program.    Problem List:   Patient Active Problem List    Diagnosis Date Noted    [facility] 08/03/2023     Priority: High     Possible Alzheimer's etiology  Jodi Hymen, Jodi Bentley, GNP-BC  Dementia Care Specialist          Closed compression fracture of body of L1 vertebra (HCC/RAF) 08/13/2023    Urinary tract infection due to extended-spectrum beta lactamase (ESBL) producing Escherichia coli 08/13/2023    Osteoporosis without current pathological fracture 06/27/2023    Dysautonomia (HCC/RAF) 03/27/2022    Dizziness 03/27/2022    Abnormal brain MRI 03/27/2022    History of fall 02/06/2022    Other malignant neuroendocrine tumors (HCC/RAF) 02/06/2022    Balance problem 02/06/2022    Impaired mobility 02/06/2022    Severe hearing loss 02/06/2022    Unspecified mood (affective) disorder (HCC/RAF) 02/06/2022    GAD (generalized anxiety disorder) 02/06/2022    Mild major depression 02/06/2022    OSA (obstructive sleep apnea) 02/06/2022 Pertinent Family Medical History:  Family members w/memory problems? yes If Yes, list:  No family history on file. Husband reports possible maternal history of memory loss.     Dementia Evaluation   Results for orders placed during the hospital encounter of 03/13/22    MR brain wo contrast    Narrative  MR BRAIN WO CONTRAST    HISTORY:  Cognitive decline, Other reason than headache, head trauma, multiple sclerosis, Parkinson's, neoplasm, seizures, subarachnoid hemorrhage, TIA, stroke, altered mental status, or syncope, memory loss h/o SDH gait impairment    COMPARISON:  None available.    TECHNIQUE: MRI of the brain was performed utilizing multiplanar multiecho sequences.    FINDINGS:    There is no restricted diffusion to suggest acute infarction. No acute intracranial hemorrhage or midline shift. There is mild generalized atrophy with asymmetric increased atrophy of the parietal lobes. The bilateral lateral ventricles are enlarged out  of proportion to the degree of atrophy. The bifrontal horn diameter measuring 46 mm. There are a few foci of FLAIR hyperintensity scattered throughout the bilateral cerebral white matter, likely representing mild microvascular ischemic changes.  There is  gradient susceptibility artifact overlying the left superior frontal gyrus and along the surface of the right frontal lobe, likely representing hemosiderin staining. The basal cisterns are patent.  The major intracranial blood vessels demonstrate normal  flow voids suggesting their patency.  The paranasal sinuses and mastoid air cells are clear.    Impression  1. No acute infarct or hemorrhage.  2. Lateral ventricles are slightly dilated out of proportion to the degree of atrophy, correlate for normal pressure hydrocephalus.  3. Hemosiderin staining along the right frontal lobe and smaller amount along left frontal lobe, likely from prior hemorrhage.  4. Generalized cerebral atrophy with asymmetrically increased atrophy of the parietal lobes.    Signed by: Maxwell Spark   03/16/2022 5:03 PM    Results for orders placed in visit on 07/21/23    CT brain wo contrast    Narrative  [facility]      Name: Streater,Gray  [facility]      Phys:Arnold,Karen L  [place]  Emergency Dept                DOB: 07-Jan-1941 Age: 89  Sex: F  [facility] [place] [address]           Acct: [id number] Donelle FunkAudelia Blazer  PHONE #: [phone]       Exam Date:07/21/23 Status: DEP ER  FAX #: [phone]         Radiology No:  Unit No: [id number]      Exams: [id number]  CT  HEAD WO CONTRAST    [address]  Press <Enter> for Order Details below?  .?  Reason(symptom/diagnoses): ao - Head injury: Fall  Clinical History: unwitnessed, dementia  Comment:   Please order CT HEAD STROKE if the patient is a CODE STROKE ?    - CT HEAD WO CONTRAST    CLINICAL HISTORY: ao - Head injury: Fall.    COMPARISON: None.    TECHNIQUE: On a multirow-detector CT scanner, a volumetric noncontrast  scan is performed from the vertex to the skull base.    RADIATION DOSE:    FINDINGS:    There is no evidence of acute intracranial mass effect, hemorrhage,  hydrocephalus or shift of the septum pellucidum.    Age related volume loss. No acute displaced fractures.    The paranasal sinuses and mastoid air cells are clear.    Impression  No evidence of acute intracranial hemorrhage or mass effect.        ** Electronically Signed by Colene Dauphin Jodi Bentley on 07/21/2023 at 1203 **  Reported and signed by: Colene Dauphin, Jodi Bentley    CC: Lera Rank PA    Dictated Date/Time: 07/21/2023 [phone])  Technologist: Neale Bale CRT  Transcribed Date/Time: 07/21/2023 [phone])  Transcriptionist: AFWP  Electronic Signature Date/Time: 07/21/2023 [phone])  Printed Date/Time: 11/16/2023 (1141)  BATCH NO: N/A    SIGNED REPORT    No results found for this or any previous visit.    No results found for this or any previous visit.    Results for orders placed in visit on 07/21/23    CT brain wo contrast    Narrative  [facility]      Name: Fergusson,Madelynne  [facility]      Phys:Arnold,Karen L  [place]  [facility]gency [facility]                DOB: March 27, 1941 Age: 72  Sex: F  [facility] [place] [address]           Acct: [id number] Donelle FunkAudelia Blazer  PHONE #: [phone]       Exam Date:07/21/23 Status: DEP ER  FAX #: [phone]         Radiology No:  Unit No: [id number]      Exams: [id number]  CT HEAD WO CONTRAST    [address]  Press <Enter> for Order Details below?  .?  Reason(symptom/diagnoses): ao - Head injury: Fall  Clinical History: unwitnessed, dementia  Comment:   Please order CT HEAD STROKE if the patient is a CODE STROKE ?    - CT HEAD WO CONTRAST    CLINICAL HISTORY: ao - Head injury: Fall.    COMPARISON: None.    TECHNIQUE: On a multirow-detector CT scanner, a volumetric noncontrast  scan is performed from the vertex to the skull base.    RADIATION DOSE:    FINDINGS:    There is no evidence of acute intracranial mass effect, hemorrhage,  hydrocephalus or shift of the septum pellucidum.    Age related volume loss. No acute displaced fractures.    The paranasal sinuses and mastoid air cells are clear.    Impression  No evidence of acute intracranial hemorrhage or mass effect.        ** Electronically Signed by Colene Dauphin Jodi Bentley on 07/21/2023 at 1203 **  Reported and signed by: Colene Dauphin, Jodi Bentley    CC: Lera Rank PA    Dictated Date/Time: 07/21/2023 [phone])  Technologist: Vinnie Greet  Lorre Rosin CRT  Transcribed Date/Time: 07/21/2023 [phone])  Transcriptionist: AFWP  Electronic Signature Date/Time: 07/21/2023 [phone])  Printed Date/Time: 11/16/2023 (1141)  BATCH NO: N/A    SIGNED REPORT    No results found for this or any previous visit.      Neuropsychiatric Evaluation/Impression: Dr. Fredna Jasper 02/03/2024  ''Auryn Selner is a 83 y.o. woman with progressive cognitive decline here today fro further evaluation together with her daughter.     In March [address] had a large volume LP NPH assessment. Has seen Dr Wilhelmenia Harada since then      Takes Rivastigmine  4.5mg  BID.     Takes 300mg  of Venlafaxine  daily plus 15mg  of Buspirone. She has a psychiatrist Dr Carlis Cherry managing/monitoring these meds.      FDG PET brain scan supporting an Alzheimer's etiology. We spoke about the previous findings on MRI  - that of hemorrhage and the history of SDH in 2021. Interestingly 2022 imaging report without mention of any old blood.      Updated MOCA assessment with score of 16/30 which is a change from Aug 2024 with score of 19/30. Delayed recall without     Lives at the [name] in [facility] care. She is doing some of the activities and eats atleast one of her meals with others. Daughter believes this has been helpful. No consider Lecanemab therapy.     Major neurocognitive disorder - Alzheimer's disease''    TSH:   Lab Results   Component Value Date    TSH 3.4 12/02/2023     Vitamin B12:   Lab Results   Component Value Date    VITAMINB12 525 03/22/2023     Folate:   Lab Results   Component Value Date    FOLATE 16.3 03/22/2023      Vitamin D Level, 25:   Lab Results   Component Value Date    VITD25OH 27 03/22/2023        Current Dementia Medications: rivastigmine  4.5 mg po BID  Aricept (Donepezil) []  yes []  no Comment:   Exelon  (Rivastigmine ) []  yes []  no Comment:   Razadyne (Galantamine) []  yes []  no Comment:   Namenda (Memantine) []  yes []  no Comment:     Medications for Mood and Behavior Problems: quetiapine  12.5 mg po at bedtime, venlafaxine  300 mg po every day, and buspirone 15 mg po TID  SSRI []  yes []  no Comment:   Other antidepressant  []  yes []  no Comment:   Atypical Antipsychotic  []  yes []  no Comment:    Benzodiazepine  []  yes []  no Comment:   Mood stabilizer []  yes []  no Comment:     Medication List:   Outpatient Encounter Medications as of 02/19/2024   Medication Sig Dispense Refill    amLODIPine  10 mg tablet Take 10 mg once daily as needed for systolic blood pressure above 244 mmHg. 30 tablet 0    bisacodyl (DULCOLAX) 5 mg EC tablet Take 1 tablet (5 mg total) by mouth daily as needed for Constipation. Cholecalciferol (VITAMIN D3) 10 mcg (400 units) CAPS Take by mouth daily.      lidocaine  5% patch APPLY 1 PATCH TOPICALLY DAILY (12 HOURS ON AND 12 HOURS OFF) TO LOWER BACK 30 patch 11    LORATADINE  10 mg tablet TAKE 1 TABLET DAILY 90 tablet 3    pancrelipase , Lip-Prot-Amyl, (CREON ) 36000 units DR capsule Take 1 capsule (36,000 units of lipase total) by mouth three (3) times daily with meals. 90 capsule 11    pantoprazole  40  mg DR tablet Take 1 tablet (40 mg total) by mouth daily. 90 tablet 1    QUEtiapine  25 mg tablet Take 0.5 tablets (12.5 mg total) by mouth at bedtime. 60 tablet 3    rivastigmine  4.5 mg capsule Take 1 capsule (4.5 mg total) by mouth two (2) times daily before meals. 180 capsule 2    venlafaxine  150 mg 24 hr capsule Take 2 capsules (300 mg total) by mouth daily. 180 capsule 1    busPIRone 15 mg tablet Take 1 tablet (15 mg total) by mouth three (3) times daily. (Patient not taking: Reported on 02/19/2024) 270 tablet 3    [EXPIRED] lanreotide acetate  120 mg/0.5 mL inj 120 mg        No facility-administered encounter medications on file as of 02/19/2024.       [x]  Dementia Care Specialist reconciled medication list in electronic medical record, reviewed medication indications and routine for administration.    Allergies/Intolerances:   Allergies   Allergen Reactions    Ace Inhibitors Cough     Other reaction(s): Other (See Comments)  cough  cough  cough      Sulfasalazine     Sulfa Antibiotics Rash and Other (See Comments)     Suspect Medications (associated with mental status changes)    Buspirone 15 mg po tid and quetiapine  12.5 mg po at bedtime,    Hospitalizations & ER Visits in the last 12 months:  Admission Date-Discharge Date: Hospital/Nursing Home, primary diagnosis      Hospitalizations Skilled Nursing Facility: [facility]   Reason for Hospitalization/SNF Visit: Broken hip   Hospitalization Date: 12/18/22     Health Related Social Needs:  Within the last year, have you been afraid of your partner or ex-partner?      How often do you see or talk to people that that you care about and feel close to?  3-5 times a week    How hard is it for you to pay for the very basics like food, housing, medical care, and heating? Not hard at all    Within the past 12 months, you worried whether your food would run out before you got money to buy more? Never true    Within the past 12 months, the food you bought just didn?t last and you didn?t have money to get more. Never true    Do you feel stress - tense, restless, nervous, or anxious, or unable to sleep at night because your mind is troubled all the time - these days? Very much   On average, how many days per week do you engage in moderate to strenuous exercise (like a brisk walk)? 0 days   On average, how many minutes do you engage in exercise at this level? 0 min    Within the past 12 months, has a lack of transportation kept you from medical appointments, meetings, work, or from getting things needed for daily living?  No   In the last 12 months, was there a time when you were not able to pay the mortgage or rent on time? No   In the last 12 months, how many places have you lived? 2   In the last 12 months, was there a time when you did not have a steady place to sleep or slept in a shelter (including now)? No   In the past 12 months has the electric, gas, oil, or water company threatened to shut off services in your home?  No     Social History:    Primary Language: English     Marital Status: Married   Children: Yes, 2 as above  Education: Graduate school  Occupation: Land  Alcohol Use: Never   Substance Use: No   Smoking: No   Sexually Active: No  Driving Habits:   does not drive  Firearms: No     Living Situation:   Living Arrangement: Other. [facility] [employer] [facility]  Rent or Own: Rent   What floor do you live on?: 7   Do you take the stairs?: No   Length at Residence: How long have you lived there: 6 months  Lives with:  , Alone in a Memory Care studio apartment; husband is in a separate  Independent Living apartment   Caregivers (non-paid and paid): yes, staff 24/7  Wandering: Yes, none since moving to memory care   Have you had a fall in the past year? Yes   Fear of Falling?: Yes   Safety Concerns:  []  Self-Neglect  []  Wandering  []  Caregiver Burnout  [x]  Falls   []  Alcohol Use  []  Substance Use  []  Driving  []  Medication Non-Adherence    [x]  Other: Anxiety, hallucinations, agitation    Financial Situation:  Adaeze Brashier does not own Her home, however does not own any additional property.  Otherwise, [place] retirement resources includes social security. Vonna Posten's finances are handled by Jodi Bentley,Jodi Bentley. Sheradyn Kwiatek does not have Medi-Cal or IHSS. Tahli Mathson does not have long-term care insurance.     Advance Care Planning:  Advance Directive: Yes  (on file: yes) Dated 03/11/2023   DPOA: Delma Bentley (husband)  First Alternate Decision Maker: Zollie Hipp and Jodi Bentley as co-agents    What Matters Most: To stay comfortable in her current memory care facility    POLST: no     Goals of Care Discussion:  Discussed POLST in detail with patient and family. Upon discussion, the following treatment options were verbalized:  Part A: [] Full code [x] DNR  Part B: []  Full Treatment  []  Trial Period of Full Treatment  [x] Selective treatment [] Comfort focused treatment  Part C: []  Agreeable to long-term artificial nutrition, including feeding tubes                []  Agreeable to trial of artificial nutrition, including feeding tubes                [x] Not agreeable to artificial means of nutrition, including feeding tubes.    It is my opinion that the medical decision-making capacity of Aliaya Hurney, at the time of this visit is:  Able to make his/her own medical decisions  []    Not able make his/her own medical decisions  []    Uncertain - May require additional testing  [x]        DAILY ACTIVITIES:  Task Help Needed? Who Helps?   Feeding  No       Getting from bed to chair No      Getting to the toilet Yes  Staff and husband   Getting dressed Yes  Staff   Bathing or showering Yes Staff   Walking across the room (includes use of cane or walker) Yes Product manager and Family   Using the telephone No     Taking your medicines Yes Staff   Preparing meals Yes She doesn't prepare any meals. They're included in [facility].   Managing money (like keeping track  of expenses or paying bills) Yes She doesn't manage any money. Husband does all.   Moderately strenuous housework such as doing the laundry Yes She doesn't do any. Provided in [facility].   Shopping for personal items like toiletries or medicines or groceries Yes Husband   Driving Yes She doesn't drive. Husband and daughter do.   Climbing a flight of stairs Yes She doesn't climb stairs.   Getting to places beyond  walking distance (e.g. bus, taxi, car) Yes Husband and daughter drive her     Functional Assessment Scale (FAST):  6  FAST Score: 6    The FAST scale has seven stages:  1 which is normal adult  2 which is normal older adult  3 which is early dementia  4 which is mild dementia  5 which is moderate dementia  6 which is moderately severe dementia  7 which is severe dementia    REVIEW OF SYSTEMS: (Limited ROS by cognitive impairment, but obtained from a caregiver)  14 point ROS completed and negative other than stated above.    PHYSICAL EXAMINATION:   Vital Signs:  BP 123/56  ~ Pulse 73  ~ Temp 36.2 ?C (97.1 ?F) (Tympanic)  ~ Resp 15  ~ Ht 5' 5.75'' (1.67 m)  ~ Wt 129 lb (58.5 kg)  ~ SpO2 98%  ~ BMI 20.98 kg/m?       01/27/2024    11:31 AM 02/03/2024     1:48 PM 02/19/2024    12:57 PM   ADC VITAL SIGNS REVIEW FLOWSHEETS   BP 121/60 116/66 123/56   Temperature 36.4 ?C (97.5 ?F)  36.2 ?C (97.1 ?F)   Heart Rate 63 67 73              Nutrition: Body mass index is 20.98 kg/m?Aaron Aas                       Wt Readings from Last 3 Encounters:   02/19/24 129 lb (58.5 kg)   12/30/23 130 lb (59 kg) 12/02/23 131 lb 3.2 oz (59.5 kg)      Pain: no       Physical Exam   General: No acute distress. Non-toxic appearance. Well nourished. Casually groomed in a blouse and slacks. Alert, calm, pleasant, cooperative. Conversant. Ambulatory with walker assist. Appears stated age.  HEENT: Normocephalic. Atraumatic. Oropharynx is clear with moist mucous membranes. Pupils equal, extra ocular movement intact.   Whisper Test: [x]  Pass []  Fail (HOH, responded well with Pocket Talker)  Neck:  Trachea is midline   Chest: Unlabored breathing on room air.   Heart: deferred  Skin: Skin color, texture, turgor normal.   Extremities: no edema  Neurologic: Affect euthymic. Conjugate gaze, equal pupils, face symmetric. Moving upper and lower extremities symmetrically. Ambulates with walker assist. Spontaneous speech, loquacious and verbose at times. Tangential speech, with mild expressive aphasia noted. Engaged and actively participating in discussion. Able to follow commands. Good eye contact. Insight and judgement appear impaired.  Psychiatric: Oriented to time [] , place [x]  , and person [x]   Calm [x]   Anxious []   Agitated/aggressive []   Irritable []   Tearful []         Motor:  Proximal arm strength, tested by abduction of arms at shoulder:deferred  Distal arm strength, tested by extension of hands at wrists: deferred  Able to do a deep knee bend?: no   Able to walk on toes? no   Able to walk on heels? no   (  Central) Facial droop present? no   Pronator drift present?  deferred    Rest tremor present? {no   Action/postural tremor present? no   Tone in arms: Normal   Tone in legs: Normal    Gait:   Able to stand from a chair without use of hands?  no  Walked across the room   []  Unaided  []  Handheld  []  Cane  [x]  Walker  []  Wheelchair  []  Motorized Scooter  []  Unable  Walks only with an assistive device?  yes   Base: normal  Arm Swing: n/a  Able to tandem walk? no   Romberg: N/A  Pull back test: N/A    HEARING SCREENING  Hearing difficulties:      COGNITIVE TESTING: MOCA by Dr. Fredna Jasper 02/12/24    PREVIOUS COGNITIVE TESTING:        PROMIS-10: 16/40  Global Physical Health v1.2:  9/20, 0 representing the most severe impairment and 20 representing the best possible physical health.  Global Mental Health v1.2:  7/20, 0 representing the most severe impairment and 20 representing the best possible mental health.    Cornell Scale for Depression in Dementia: 23, Scores greater than 12 indicate probable depression.    NPI-Q:    Symptom Present Severity Score   (how it affects patient) Distress Score   (how it affects caregiver)   Delusion Yes  Mild   Mild     Hallucinations Yes Moderate Moderate   Agitation/Aggression Yes Mild Moderate   Depression/Dysphoria Yes Moderate Mild   Anxiety Yes Moderate Mild   Elation/Euphoria No       Apathy/Indifference Yes Severe Moderate   Disinhibition No       Irritability/lability Yes Mild Mild   Motor Disturbance No       Nighttime Behaviors Yes Severe Mild   Appetite Changes/Eating Yes Severe Mild   TOTALS  Severity Total: 18 Distress Total: 21     Caregiver Patient Health Questionnaire (PHQ-9): 2  Total Score Depression Severity   0-4 None   5-9 Mild   10-14 Moderate   15-19 Moderately Severe   20-27 Severe     Modified Caregiver Strain Index (MCSI): 15/26, the higher the score the higher level of caregiver strain.    Zarit Scale of Caregiver Burden Total Score: 53  Total Score Caregiver Burden Severity   0-20 No to Mild Burden   21-40 Mild to Moderate Burden   41-60 Moderate to Severe Burden   > 61 Severe Burden     ADC SCORE TOTALS      12/06/2023 02/11/2024 02/19/2024   ADC TOTAL SCORES   NPI-Q Total Severity 18  18    NPI-Q Total Distress 21  21    Cornell Depression in Dementia   23   Caregiver Depression Score   2   MCSI Score 15  15    PROMIS-10 Score   16   ZARIT Total 53  53        Patient-reported        SUMMARY:  Kierah Frutos is an 83 y.o. female with Late Onset Alzheimer's Dementia with an estimated onset in 2020, behavior disturbance. Ronie Raczkowski is partially  independent with ADLs and is dependent with IADLs. Cleophas Dadds and their caregiver presented today interested in receiving educational information about dementia, respite care, as well as methods of dealing with behavioral disturbances.    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 She lives in [facility], supportive family lives nearby to assist Jodi Bentley lives close and husband Dr. Sundra Engel lives in [facility])   Plan: Continue current medication regimen and environmental support. Appreciate Dr. Carlis Cherry following her care.  Plan: I discussed the risks/benefits/alternatives of continuing  antipsychotic medication with patient/caregiver, specifically discussed the FDA Black Box warning in dementia patients treated with atypical antipsychotics--the warning of increased mortality risk with atypical antipsychotics vs placebo. Although causes of death varied, most were cardiovascular (heart failure, sudden death) or infectious (pneumonia). Relative risk between 1.6 and 1.7, absolute risk 4.5% vs 2.6% in placebo group. Surrogate decision maker verbalized understanding of these risks and agreed to continue antipsychotic therapy. Plan to attempt a gradual dose reduction of quetiapine  in 6 to 9 months.   Plan: Return to the [facility] clinic to see Jodi Bentley on Zoom 03/11/24 at 4 pm on Zoom Jodi Bentley and brother Marysue Sola)    SUSPECT MEDICATIONS: Ellanie Goelz's Last NPI-Q: Severity Score is Severity Total: 18 and Distress Score is Distress Total: 21  [x]  Cleophas Dadds undergoing treatment with quetiapine  12.5 mg po qhs. GDR Not Applicable at this time, as Cierah Zaremski with active neuropsychiatric symptoms. Will consider GDR in 3-6 months.     CARDIOVASCULAR DISEASE:   BP 123/56  ~ Pulse 73  ~ Temp 36.2 ?C (97.1 ?F) (Tympanic)  ~ Resp 15  ~ Ht 5' 5.75'' (1.67 m)  ~ Wt 129 lb (58.5 kg)  ~ SpO2 98%  ~ BMI 20.98 kg/m?   Plan: Dementia Care Specialist recommends Gabriela Warfield minimize vascular risk factors:  Recommended blood pressure goal: < 150/90 (age 22 years or older). Blood Pressure managed with: amlodipine  10 mg po qd    QUALITY OF LIFE FOR PEOPLE WITH NEUROLOGIC CONDITIONS/MAJOR DEPRESSION: Khalise Overbey?s behavior and symptoms are modestly controlled.  Cornell Scale for Depression in Dementia=23, Scores greater than 12 indicate probable depression.  PROMIS-10: 16/40, 0 representing the most severe impairment and 40 representing the best possible health.  NPI-Q severity score=Severity Total: 18 and distress score=Distress Total: 21  Alexix Kratz is currently treated with quetiapine  12.5 mg po at bedtime, venlafaxine  300 mg po every day, and buspirone 15 mg po TID. Followed by Dr. Carlis Cherry.   Plan: Continue current medication regimen and follow up with Dr. Carlis Cherry.    GAIT AND BALANCE DISORDER WITH A FALL RISK/OSTEOPOROSIS: Holliann Mccartt has fallen 1 time in the past 12 months.  Plan: Please review methods to decrease fall risk: Check For Safety A Home Fall Prevention Checklist For Older Adults (TonerPromos.no)  HEARING IMPAIRMENT: Jaiyana Schimming?s behaviors may be exacerbated by Ninette Basque Cyran?s sensory limitations.   Plan: Dementia Care Specialist provided information for a pocket talker : [email or web address]          ADVANCE CARE PLANNING:   Advance Directive: Yes  (on file: yes) Dated 03/11/2023   DPOA: Delma Bentley (husband)  First Alternate Decision Maker: Zollie Hipp and Jodi Bentley as co-agents    What Matters Most: To stay comfortable in her current memory care facility    POLST: no     Goals of Care Discussion:  Discussed POLST  in detail with patient and family. Upon discussion, the following treatment options were verbalized:  Part A: [] Full code [x] DNR  Part B: []  Full Treatment  []  Trial Period of Full Treatment  [x] Selective treatment [] Comfort focused treatment  Part C: []  Agreeable to long-term artificial nutrition, including feeding tubes                []  Agreeable to trial of artificial nutrition, including feeding tubes                [x] Not agreeable to artificial means of nutrition, including feeding tubes.    It is my opinion that the medical decision-making capacity of Naelle Kesling, at the time of this visit is:  Able to make his/her own medical decisions  []    Not able make his/her own medical decisions  []    Uncertain - May require additional testing  [x]          BEHAVIORAL- Care Plan         DEMENTIA RELATED BEHAVIOR DISTURBANCES:   Plan: Alzheimer's [place] Caregiver Tip Sheets provided in [place] and [place].  [email or web address].cz  Plan: Consider reviewing the Alzheimer's and Dementia Care Videos Guide: Caregiver Training ~ [place] [facility]'s and Dementia Care Program - YouTube  Aggressive Language/Behavior  Caregiver Training: Aggressive Language/Behavior ~ Dispensing optician and Dementia Care Program - YouTube  Agitation and Anxiety Caregiver Training: Agitation and Anxiety ~ [place] Alzheimer's and Dementia Care Program - YouTube  Depression/Apathy Caregiver Training: Depression/Apathy ~ [place] Alzheimer's and Dementia Care Program - YouTube  Hallucinations Caregiver Training: Hallucinations ~ Dispensing optician and Dementia Care - YouTube  Home Safety Caregiver Training: Home Safety ~ [place] Alzheimer's and Dementia Care Program - YouTube  Sleep Disturbances Caregiver Training: Sleep Disturbances ~ [place] Alzheimer's and Dementia Care Program - YouTube  Sundowning Caregiver Training: Sundowning ~ [place] Alzheimer's and Dementia Care Program - YouTube     SOCIAL- Care Plan  CAREGIVER EDUCATION: Jodi Bentley would benefit from learning more about Dementia,  its progression and ways to take care of themselves while caring for [employer].  Plan: Kassy Yordy's family may find Alzheimer's Association caregiver training resources helpful: https://thomas-smith.info/  Understanding Alzheimer's and Dementia  Dementia Conversations: Driving, Doctor Visits, Legal & Financial Planning  Effective Communication Strategies  Living with Alzheimer's: For Caregivers - Middle Stage  Understanding and Responding to Dementia-Related Behavior    MILD CAREGIVER BURDEN: Jodi Bentley serves as Development worker, community Pizzimenti?s primary caregiver and has a Modified Caregiver Strain Index score of 15, Zarit Score of 53, and  PHQ-9 score of 2. Sherica Dobler?s family would benefit from speaking to other people that are caring for loved ones with dementia.  Plan: Consider attending a dementia caregiver support group. Referral emailed to [facility] 02/19/24.   The Jerilynn Montenegro. [place] [facility] [facility] Dementia [facility] Caregiver Support Groups: http://www.gould-leon.com/  Plan: Consider the need for individual counseling. Provided vouchers for a trial of individual counseling supported by the [place] [facility] Dementia Care [facility]. 3 vouchers with Christel Cousins Foley.    CAREGIVER RESPITE:   Plan: GUIDE respite with [facility]      Time at end of visit: 1700     180 minutes (intermittently between 1300 and 1700) were spent personally by me today on this encounter which include today's pre-visit review of the chart, obtaining appropriate history, performing an evaluation, documentation and discussion of management with details supported within the note for today's visit. The time documented was exclusive of any time spent  on the separately billed procedure.      Future Appointments   Date Time Provider Department Center   02/24/2024 11:30 AM Geofm Killer, Samantha Cress., Jodi Bentley HEMONCENC [facility]   02/26/2024  1:30 PM Cecilio Coffer., Jodi Bentley [facility]   02/28/2024 12:00 PM EIC PCT01 PET EIC [facility]   03/05/2024  4:40 PM Lear Prosper., Jodi Bentley HEM/ONC [facility]   07/06/2024  2:00 PM Berton Brock., Jodi Bentley [facility] [place]/        Yolonda Henderson. Stevenson Elbe, Jodi Bentley  Dementia Care Specialist  [place] [facility]  Phone: [phone]  Fax: [phone] prevent fall related injuries.    Plan: Dementia Care Specialist recommends Home Health OT referral for home safety inspection and recommendations.    Plan: Dementia Care Specialist recommends Home Health Physical Therapy for strengthening and gait and balanced disorder.    Plan: Dementia Care Specialist recommends referral for Geriatric Falls Clinic for further evaluation of recurrent falls and to address risk factors for falling and identify treatments to help prevents falls and resulting injuries.     Plan: Please review methods to decrease fall risk: Check For Safety A Home Fall Prevention Checklist For Older Adults (TonerPromos.no)    UNINTENTIONAL WEIGHT LOSS: Carlas Kriger has had a decrease in oral intake and is noted to have lost *** pounds in ***.  Plan: Dementia Care Specialist will recommend Yuleimy Dorning?s family supplement calorie and protein intake  between meals.  Some available options are:   Protein Powders   Recommended brands of protein shakes (make sure to check dairy content):   Premade  Ensure plus - 350 calories, 11g fat, 48g carbs, 16g protein  Boost plus - 350 calories, 14g fat, 45g carbs, 14 g protein  Boost very high calorie drink - 530 calories, 26g fat, 52g carbs, 22 g protein  [employer] meal replacement shake- 325 cal, 12g fat, 38g carbs, 16g protein (plant based)  Orgain Organic Vegan Plant Based Nutritional shake- 220 cal, 6g fat, 25g carbs, 16g protein  (plant based)     Powder  OptimumGold Whey Protein (from GNC, [employer])  [employer] Whey or Egg white protein powder (Amazon)      Plant-based Protein (no dairy)  Owyn protein shake- 180 cal, 20g protein, 10g carb, 7 g fat  Evolve protein shake- 150 cals, 20 g protein, 19g carbs, 1 g fat  Kate Farms Komplete meal replacement shake- 325 cal, 12g fat, 38g carbs, 16g protein  Orgain Organic Vegan Plant Based Nutritional shake- 220 cal, 6g fat, 25g carbs, 16g protein  PlantFusion Complete Plant-Based Protein Shake- 150 cal, 4.5g fat, 9g carbs, 18g protein  Vega Protein Nutrition Shake- 170 cal, 4.5g fat, 13g carb, 20g protein     Note: ingredients and nutrition facts may change, so please review the label before purchasing     General tips  1) Please read ingredients carefully if you have allergies/intolerance to gluten, soy, lactose or artificial sweetners.  2) You can find these at most pharmacies and grocery stores.   3) You may want to go to healthfood stores like GNC or Vitamin Shoppe to see if you can purchase individual packages (single serving) of the protein powder to try it out before you commit to a large container and so you can try out different brands/flavors   4) Prepare by adding to water per the directions on the container or consider adding milk   5) If buying protein  powder, suggestions for preparation  What to mix it with  ? Can use milk  ? Can easily add in psyllium for extra fiber   ? Can make creamier with  yogurt        Ways to add more flavor to your shakes:  1) Mix with oat milk, almond milk   2) Extracts   ? Almond, black walnut, hazelnut  ? Banana, cherry, lemon, pineapple, raspberry, strawberry  ? Chocolate, vanilla coconut, maple, peppermint   3) Blend with ice  4) May add fruit     Plan: Please see the below reccommended to support a fortified diet to help manage weight loss.  (Adapted from http://young.com/)  1) Eat more frequently- If you feel that you are full faster, eat 5 to 6 smaller meals during the day. Alternatively eat 3 heavier meals and 3 snacks daily between meals.      2) Consider a nutritional supplement drink if unable to tolerate 5 to 6 meals/snacks daily (add a protein shake) or a fruit smoothie with addition of whey protein or glucerna powder     2) Choose nutrient-rich foods- choose whole-grain breads, pastas, and cereals; fruits and vegetables; dairy products, lean protein sources, nuts      3) Top it off. Add extras to your dishes for more calories -- such as cheese in casseroles and scrambled eggs, and milk in soups and stews. Add whey protein to dishes.      4) Make every bite count. Snack on  peanut butter, canned fruit and avocados. Increase fat content by adding olive oil (or other ''good fat'') in preparation of sauces, fresh or cooked vegetables, and grains. I     5) Watch when you drink. Some people find that drinking fluids before meals blunts their appetite. In that case, it may be better to sip higher calorie beverages along with a meal or snack. For others, drinking 30 minutes after a meal, not with it, may work.    SOFT MEAL PLANS  Breakfast   1) Oatmeal made with oat milk, peanut butter and protein powder   2) [place] yogurt with bluberry puree   3) 2 scrambled eggs with 1/2 avocado    Snack    Lunch   1) spinach   2) gerber beef and gravy (amazon) or baked chicken   3) canned low sugar fruit  4) pureed cauliflower  5) pureed or baked cod   6) 1/2 mashed sweet potatoes   7) gerber chicken and gravy or backed Nurse, mental health   1) spinach   2) gerber beef and gravy (amazon) or baked chicken   3) canned low sugar fruit  4) pureed cauliflower  5) pureed or baked cod   6) 1/2 mashed sweet potatoes   7) gerber chicken and gravy or backed chicken/turkey     Snack ideas   Smoothie or Glucerna Protein shake  Dairy free vanilla ice cream or chocolate ice cream ? cup  Oat milk ? cup with protein powder 1 scoop  Mashed bananas  Miso Soup    HEARING IMPAIRMENT: Briania Manolis?s behaviors may be exacerbated by Ninette Basque Nakajima?s sensory limitations.  Plan: Tulani Kouba is interested in a hearing aide. Dementia Care Specialist placed a referral with an Audiologist:   Audiology  [address]   Appointment Center: [phone]     Plan: Dementia Care Specialist provided information for a pocket talker : [email or web address]  POLYPHARMACY: Aislynn Kienzle is currently on *** medications a day, many of which are vitamins and supplements.  Plan: Dementia Care Specialist will defer medication management to   Jodi Bentley, Jodi Cordia., Jodi Bentley as well as recommend consultation with a Pharmacist through the [facility] Program.    DECREASE HOSPITALIZATIONS: Breonna Arevalos has had a potentially avoidable hospitalization.  Plan: Dementia Care Specialist will be in regular communication with Monalisa Allnutt?s to help reduce future ED visits and hospital stays.    QUESTIONABLE DYSPHAGIA with APIRATION RISK: Caila Bonvillain is noted to have increased incidents of pocketing food and requiring prompting for consistent swallowing.    Plan: Dementia Care Specialist discussed the elevated risk of aspiration and progression of Temprance Martelli?s dementia process.  Protecting the lungs from aspiration    Your lungs help you to breathe and bring oxygen into the body. The word, aspiration, means breathing in any object, other than air, into the lungs. If the health care team has concerns about foods, drinks, saliva or stomach content being inhaled into the lungs, then an aspiration plan is used. An aspiration plan lowers the risk of serious lung infection or breathing problems by preventing aspiration before it happens.    The risk for aspiration is greater with:   Swallowing problems   Poor ability to cough and clear the lungs, when needed   Changes in alertness    What can I do as a caregiver?  Before and after eating follow these safety guidelines:   Keep the person sitting upright at 90 degrees while eating and for 45 minutes after eating.   Cut food into small, bite-sized pieces.   Check the mouth before eating to make sure it is clear of food.   Give mouth care before and after each meal as directed by the health care team.   Give close or distant supervision as directed by the health care team.    Notify your physician for all changes to the person?s baseline especially :    - Increased coughing                 - Increased secretions or changes to secretions (color, texture)    - Change in respiratory status    - Fever     Plan: Dementia Care Specialist recommended 1:1 supervision for all meals.     QUESTIONABLE DYSPHAGIA: Trenton Henzler?s family reports coughing on occasion when eating.  Plan: Dementia Care Specialist will defer management to Jodi Bentley, Jodi Cordia., Jodi Bentley, however consider a Modified Barium Swallow Study to assess for dysphagia and need to modify the texture of Alante Gangi?s food and liquids.      ADVANCE CARE PLANNING:   ***  Plan: Dementia Care Specialist provided information regarding POLST form from [employer] [place]  [employer] of Compassionate Care ([email or web address]) and will remain available for goals of care discussions as needed.    Plan: Dementia Care Specialist provided information regarding advanced directives  [email or web address]    Plan: Please forward a copy of advance directives to Dementia Care Specialist for submittal to electronic medical record.      THYROID HORMONE: Elnoria Schappell?s thyroid stimulating hormone (TSH) level was *** on ***.   Plan: Continue current plan of care, regimen:     Plan: Dementia Care Specialist will defer treatment to  Jodi Bentley, Jodi Cordia., Jodi Bentley, however, consider re-ordering TSH blood test on the next routine lab draw as abnormal thyroid levels can negatively impact memory and mood.     VITAMIN B12:  Vali Paxton?s Vitamin B12 level was *** on ***.   Plan: Continue current plan of care, regimen:     Plan: Dementia Care Specialist will defer treatment to Jodi Bentley, Jodi Cordia., Jodi Bentley, however, consider re-ordering Vitamin B12 blood test on the next routine lab draw as abnormal levels can negatively impact memory and mood.    BEHAVIORAL- Care Plan         DEMENTIA RELATED BEHAVIOR DISTURBANCES:   Plan: Alzheimer's [place] [employer] forces logistics/support/administrative officer provided in [place] and [place].  [email or web address].cz    Plan: Consider reviewing the Alzheimer's and Dementia Care Videos Guide: Caregiver Training ~ [place] Alzheimer's and Dementia Care Program - YouTube  Aggressive Language/Behavior  Caregiver Training: Aggressive Language/Behavior ~ Dispensing optician and Dementia Care Program - YouTube  Agitation and Anxiety Caregiver Training: Agitation and Anxiety ~ [place] Alzheimer's and Dementia Care Program - YouTube  Depression/Apathy Caregiver Training: Depression/Apathy ~ [place] [facility]'s [facility] Dementia Care Program - YouTube  Hallucinations Caregiver Training: Hallucinations ~ Dispensing optician and Dementia Care - YouTube  Home Safety Caregiver Training: Home Safety ~ [place] Alzheimer's and Dementia Care Program - YouTube  Refusal to Bathe Caregiver Training: Refusal to Bathe ~ [place] Alzheimer's and Dementia Care - YouTube  Refusal to Take Medications  Caregiver Training: Refusal to Take Medication ~ [place] [facility]'s [facility] Dementia Care Program - YouTube  Repetitive Behaviors  Caregiver Training: Repetitive Behaviors ~ Dispensing optician and Dementia Care Program - YouTube  Sexually Inappropriate Behaviors  Caregiver Training: Sexually Inappropriate Behaviors ~ [place] Alzheimer's and Dementia Care Program - YouTube  Sleep Disturbances Caregiver Training: Sleep Disturbances ~ Dispensing optician and Dementia Care Program - YouTube  Sundowning Caregiver Training: Sundowning ~ Dispensing optician and Dementia Care Program - YouTube  Wandering Caregiver Training: Wandering ~ [place] [facility]'s and Dementia Care Program - YouTube       SAFETY/MEDICATION MANAGEMENT: Adyn Askins is at risk for intermittent medication errors.  Plan: Dementia Care Specialist will recommend family assist with medication administration.      Plan: Dementia Care Specialist will recommend family assist with medication administration.  Please review available options:  - Electronic Pill Box: https://www.alzstore.com/searchresults.asp?Search=pill+box&Submit=    - [employer] Simple Dose SimpleDose is a pill organizer that helps you keep track of the medications you take and when to take them. It's so much easier than sorting through your Rx bottles and trying to remember which pills you take at different times of day. [email or web address]    - [employer] is a Emergency planning/management officer that sorts and delivers medication at no additional cost. We coordinate with doctors and insurance to transfer prescriptions and keep refills up to date. The service and shipping are free. For your monthly medications you'll pay your insurance copay or any out-of-pocket expenses (if applicable). For any vitamins and over-the counter medications, you'll pay for any non-prescription medications you add to your service. You can set up your HSA or FSA card as your primary payment method or you can use a credit card or bank account. Call to discuss potential discount options or if you have a copay coupon. Sign up at [email or web address] [phone]. You will need your list of medications. Your doctor information, any insurance information and your preferred method.          SAFETY/LIVING ALONE WITH EARLY STAGE DEMENTIA: Malaiah Shakur continues to live at home independently.  Plan: Dementia Care Specialist recommends Avari Klosowski's family review Tips for Living Alone with Early-Stage Dementia: WikiOutlook.ch    Plan: Please see AARP's tips on ''  What You Can Do to Prevent Caregiver Fraud'' RetroTuxedo.hu    Plan: Please see [employer] [place]'s Guide to Family Conversations About Disaster Planning, Caregiving,  Alzheimer?s Disease and Dementia: https://files.LiveGrowth.fi.pdf      SAFETY:  Areeya Nation is unable to remember to dial 911 in case of an emergency ***and has the potential to wander and/or become lost in public.   Plan: Continue to provide 24/7 supervision.    Plan:  Dementia Care Specialist will educate Kahlani Castronova?s family on methods to decreased the risk of wandering.  Keep home exits secure, consider installing locks high on the door, disguising door handles, installing door alarms. See link for additional tips: (https://www.mann.com/.asp)    Plan: Consider obtaining a Medical Alert Systems or GPS devices specifically designed for person's with Dementia  LA Found https://lafound.com/tracking-technology/ https://lafound.com/project-lifesaver/   LA Found  https://lafound.https://miller-johnson.net/ LA Found/Project Lifesaver.  One way that families can get the bracelet would be to apply for it.  Once approved, the LA Found Unit will contact the family to schedule distribution of the bracelet, train the caregiver on how to assemble the bracelet and test/replace batteries on the device. The LA Found Unit at [facility] will also assist with setting up the bracelet with the [facility] Department.  The representative I spoke with shared that some areas do have a waitlist, but could not tell me the affected areas.  There are no fees if the family goes this route.     The other way to get the bracelet would be to purchase it.  The cost is 375.00.  If the family decides to purchase the bracelet, they still need to reach out to LA Found for verification, authorizing them to purchase the bracelet.   Ways to get LA Found Application? Email at LAFound@wdacs .CyberSaga.com;  By calling the LA Found Hotline at [phone] Monday-Friday 8 AM to 5 PM    MedicAlert Bracelet with Safe Return Identification Program through the Alzheimer's Association http://www.turner.net/  Government social research officer Portal (LEAP)  Some local law enforcement offices also offer the [place] [employer] Enforcement Agency Portal (LEAP) program. With funding from the U.S. Department of [place], MedicAlert Foundation's LEAP Program provides free enrollment in the MedicAlert + Alzheimer's Association Safe Return program to individuals with Alzheimer's or a related dementia who are enrolled through an online portal for law enforcement. The LEAP program provides approved law enforcement agencies a free online portal to enroll people directly into the program. This program is free for law enforcement agencies and enrollment is free to at-risk individuals and their caregivers. Contact your local law enforcement office to find out if they are part of the LEAP program.  NiSource: https://www.lifeline.GreenPlague.co.za  LifeAlert: ClearanceMarkets.pl  The Medical Guardian TerrificApps.com.br ccdf8da5&utm_source=bing&utm_medium=cpc&utm_campaign=Brand%20-%20Males%20-%20Desktop&utm_term=medical%20guardian%20alert%20system&utm_content=medical%20guardian%20alert%20system  The Alzheimer's Store: https://www.alzstore.com/default.asp  The Jiobit, the smallest Real-time Location Tracker: Amazon.com: Jiobit Gen 2 - Tracker for Kids, Teens, Seniors, Autism, Special Needs, Dementia ~ Real-Time GPS Tracking (Discontinued by Child psychotherapist) : Social research officer, government Alert ID Bracelet: https://www.medicalidfashions.com/store/alzheimers  Theora Care Products- watch/cellphone can be unremovable: https://theoracare.com/technology-for-caregivers-3/#theora-connect  https://www.alzstore.com/gps-tracker-watch-elderly-p/0950.htm  Angel Sense: https://www.angelsense.com/gps-tracker-for-elderly/  The SafeWander: https://www.alzstore.com/bed-alarm-elderly-p/0199.htm  GPS SmartSole: https://gpssmartsole.com/gpssmartsole/  Alerta Patch Wandering Alarm + Receiver: https://www.alzstore.com/alerta-wandering-alarm-patch-p/0902.htm  Envoy at home: https://envoyathome.com/  View Clix: https://www.viewclix.com/    CENTRALIZED RESOURCE HUB FOR OLDER ADULTS IN [place]  Plan: The [facility] Department of [employer], Aging and [employer] ([place]), in collaboration with 18 other [employer] and agencies, is pleased to announce a new Biochemist, clinical for older adults,  linking residents to 120+ unique senior services available throughout [place], including:  Emergency Preparedness - Find your closest cooling center, request free sandbags, and sign up for your local emergency notification system.  Health and Wellness - Find available health services, including mental health, protective, supportive, and physical fitness services.  Nutrition Services - Find your local Community and [employer], nearest food bank, and financial food subsidies.  Building services engineer; Housing; Investment banker, operational Disputes; Social, Garment/textile technologist, and Passenger transport manager; and [employer].  The comprehensive resource hub located at EmailRemedy will make it easier for older adults, families, and caregivers to navigate and access the wide range of age-friendly [employer].  WAKE SLEEP CYCLE DISTURBANCE:  Honest Fei?s family report profound daytime napping.   Plan: For better sleep hygiene:  Avoid napping during the day. It can disturb the normal pattern of sleep and wakefulness.   Avoid stimulants such as caffeine, nicotine, and alcohol too close to bedtime. While alcohol is well known to speed the onset of sleep, it disrupts sleep in the second half as the body begins to metabolize the alcohol, causing arousal.   Exercise can promote good sleep. Vigorous exercise should be taken in the morning or late afternoon. A relaxing exercise, like yoga, can be done before bed to help initiate a restful night's sleep.   Food can be disruptive right before sleep.  Stay away from large meals close to bedtime.  Ensure adequate exposure to natural light. This is particularly important for older people who may not venture outside as frequently as children and adults. Light exposure helps maintain a healthy sleep-wake cycle.   Establish a regular relaxing bedtime routine. Try to avoid emotionally upsetting conversations and activities before trying to go to sleep.   Associate your bed with sleep. It's not a good idea to use your bed to watch TV, listen to the radio, or read.      SAFETY/DRIVING:NAME@ continues to drive an automobile. In light of the patient?s cognitive deficits, it is recommended that the patient refrain from driving until a full evaluation of driving abilities is conducted by the [facility].   Plan: Dementia Care Specialist recommends a Confidential Morbidity Report (CMR) may be filed with the [facility] by referring physician.    Plan: There are a few options available to provide some preparation for retesting with DMV:  Hospital-based Acupuncturist Programs throughout [facility]  [place]: [facility] for Rehabilitation Medicine Driver Preparation Program:  DomainThemes.com.br  (Requires completed registration packet: ModelSolar.es.pdf)  Phone: [phone]    Nikki Barters: http://ranchofoundation.org/drivers-education-training-program  Phone: [phone]  Fax: [phone] / [phone]    [place]: [facility] Outpatient Program  https://www.cedars-sinai.org/programs/physical-medicine-rehabilitation/treatments/driving-assessment.html  Phone: [phone]    [facility]: AptDealers.si  [employer] Phone: [phone]  Superior Phone: [phone]    2.    Senior [employer] Ombusdman BodyEditor.com.cy  In a continuing effort to keep seniors driving for as long as they can do so safely, the Department of Motor Vehicles [facility]) has created a M.D.C. Holdings. There are four ombudsmen assigned to this program who are located in various parts of [place] . The primary function of the Ombudsman Program is to represent the interest of public safety for all [place] with a special interest in addressing the concerns of senior drivers. The ombudsmen can assist as a ''go-between'' to ensure that senior drivers are treated fairly, consistent with laws and regulations, and with the dignity and respect they  deserve. The Ombudsmen are available to assist in individual cases, as well as participate in outreach seminars to large and small audiences to promote driver safety in [place]  with an emphasis on senior issues. DMV Senior Ombudsmen are available to assist you at the following locations:                 [place]  [phone]                 [facility] [phone]                 [place] [phone]                 [place] [phone]     Plan: Dementia Care Specialist will recommends Layani Brinkley enroll a transportation assistance program.  I will also offer information regarding:  Baby Bolt Assist: https://www.uber.com/blog/[place]/introducing-uberassist-la/   Go Go Grandparents: MClerk.tn    Plan: Referral placed to [facility] Social Worker to assist with management of safety issues relating:  []  Lack of supervision []  Self neglect []  Wandering []  APS case []  Other:     SAFETY/INDEPENDENT PUBLIC TRANSPORTATION:   Plan: Dementia Care Specialist will recommend Alantra Gaylord enroll a transportation assistance program.  I will also offer information regarding:    One Generation: If you are a Senior, 31 years old or older and are in need of transportation, please call [phone] for information.   [place] City Ride: BrandingSearch.se.pdf  Agustina Aldrich & Healthy Aging: http://www.smith-wilson.com/  TLC Senior Care: http://www.tlcseniorride.com/  Uber Assist: https://www.uber.com/blog/[place]/introducing-uberassist-la/   Go Go Grandparents: MClerk.tn  ACCESS: ACCESS is a curb-to-curb shared-ride transportation program that is provided for elderly and disabled individuals within [facility]. Eligible individuals are required to schedule rides ahead of time. ACCESS does not provide emergency or same day transportation services. Most ACCESS rides are shared with other ACCESS riders. To qualify for ACCESS, you must schedule an eligibility appointment. ACCESS will schedule a pick up at the curbside of your home and transported to [employer] where the intake and application process will take place. Once completed, the applicant will be taken back to their home. To learn more and to schedule an appointment to apply for ACCESS please call Customer Service at [phone] or visit. [email or web address]    Step 1: Determine Eligibility: For the application packet and more information on the evaluation process, download the Evaluation Information and Application Packet from link BargainMaintenance.cz. This application is available in alternative formats. If you require an accessible format of this application, please contact Access Customer Service: [phone] (TDD [phone]).    Step 2: Obtaining an Access ID Number: Contact Access Customer Service in order to request an Access ID number before scheduling a Transit Evaluation appointment. Call Customer Service at [phone] (option 1) (TDD [phone]. Inform the Customer Service Representative (CSR) that you have reviewed the application online and need to obtain an ID number.     Step 3: Schedule a Transit Evaluation: To schedule the evaluation, Call [phone] (TDD [phone]).    Step 4: Approval Process: Within 21 days after completing the Transit Evaluation, you will be notified by mail whether or not you are eligible for Access. If your eligibility is not processed within 21 days, please contact Access Customer Support Center and Access will allow you to use the service until you receive your notification. Phone: [phone] Website: [email or web address]  Non-Emergency Medical Transportation:   [facility]  Phone: [phone]   Services: Wheelchair, Doctor, general practice, bariatric transport, loaner wheelchair, and ambulance     [employer]  Phone: [phone]  Services: Wheelchair     Med [employer]: [phone]   Services: Wheelchair, Doctor, general practice, bariatric transport, loaner wheelchair and ambulance    [facility]: [phone]  Website: MediaChronicles.si  Services: Wheelchair, Doctor, general practice, bariatric transport, loaner wheelchair and ambulance     [employer]   Phone: [phone]   Services: Bariatric transport, wheelchair    Plan: Referral placed to [facility] Social Worker to assist with management of safety issues relating:  []  Lack of supervision []  Self neglect []  Wandering []  APS case []  Other:     SAFETY/FIREARMS:  Plan: Dementia Care Specialist recommends:  Firearms are kept in a locked cabinet, safe, firearm vault or storage case, preferably outside of the living space.  Ammunition should be locked in a place separate from firearms.  Firearms be unloaded when not in use.  Ask for help from the local sheriff or police if you are unfamiliar with firearm safety or choose to discard a weapon.    Plan: Referral placed to [facility] Social Worker to assist with management of safety issues relating:  []  Lack of supervision []  Self neglect []  Wandering []  APS case []  Other:     HOME SAFETY/COOKING:   Plan: Dementia Care Specialist recommends family to establish a new family routine to assist during meal preparation and cooking but not to continue cooking independently. Assign portions of meal preparation patient is able to complete safely with supervision.     Plan:  Dementia Care Specialist recommends Marty Picket family consider looking into the iGaurdStove (https://williams-barton.com/), an automatic stove shut off device.    Plan: Below are resources for home delivered meals  One Generation: Raynaldo Call, Program Manager - Case Management at [phone] for information  Meals on Wheels (ModemGamers.si)  [facility] Family Service Senior Nutrition Program (MobileKicks.be.aspx?pid=227)  Plan: Referral placed to [facility] Social Worker to assist with management of safety issues relating:  []  Lack of supervision []  Self neglect []  Wandering []  APS case []  Other:     HOME SAFETY/HOARDING:  Plan: Here is a link for professional organizers from the Alzheimer's Association: CobrandedAffiliateProgram.com.au    Plan: Referral placed to [facility] Social Worker to assist with management of safety issues relating:  []  Lack of supervision []  Self neglect []  Wandering []  APS case []  Other:     HOME SAFETY/HOMECARE:  Plan: Referral placed to [facility] Social Worker to assist with management of safety issues relating:  []  Lack of supervision []  Self neglect []  Wandering []  APS case []  Other:     Plan: Please see the below options to help with home care needs:      HOME SAFETY/HOME MODIFICATION:  Warehouse manager and Consulting   [address], [place], [place] [address]   [phone]   http://accessibleconstruction.com/     Active Homes   Aging in Place Home Modification  [phone]   http://againinplacemods.com/    Forever Active   [phone]   http://foreveractivemed.com/    [employer] Department: [employer]  Provides free minor home repairs for older adults throughout [facility];   call for a referral [phone] English [phone] Toll-free/Spanish   BareFoto.cz    Stayhome Safe, Inc   [phone]   http://stayhomesafe.com/    [facility] - Chores & Other In-Home Services  [employer] In-Home Services provide personal care, homemaker, chores, food  boxes, emergency material aid, minor home repairs, modifications, and/or security devices to frail older adults. Call [phone] for more information.    SOCIALIZATION/EXERCISE/DAILY ROUTINE: Lachlyn Meer was previously more social.  Currently Shewanda Cerf outside activities are limited. Limited social interaction and limited stimulation may contribute to low mood and faster cognitive decline.   Plan: Please see the below options to help fill the day and stay physically active:  - All Active Chair-based strength exercises (resistance bands):   http://allactive.co.uk/wp-content/uploads/2016/04/Chair-based-strength-exercises-resistance-bands-AllActive-Information-Guide.pdf  - [employer] Health and Wellness Videos: NetworkAce.co.nz  - Activity Alternatives: ProfitTrainers.si    Plan: Dementia Care Specialist will provide Yeilin Rapalo family with information about the use of a daily routine based on Latacha Puricelli background, work history, leisure interests, social preferences, personal care habits and routines, and wake and sleep patterns ParkingHistory.de.    Consider participation in:   Adult Day Care Program. To find locations near you please visit:  [email or web address]://[email or web address]  (click on community services/adult day care programs/enter zip code)      Games developer (MPCs)  MPCS are located throughout the [place].  To find the center that serves your community, you can:  Call [phone] or [phone]  Email: [email or web address]@[email or web address] .org     Partnering Adult Day Care Programs:  WISE & Healthy Aging: Adult Day Service Center  [address]nd Floor [place], [place]  [address]  [phone] (fax)  www.wiseandhealthyaging.org    WISE & Healthy Aging: Adult Day Service Center  [phone] [facility] [address]  [facility] Building, Lower Level)  [place], [place]  [address]  [phone] (fax)  www.wiseandhealthyaging.Carmell Chiquito: Adult Day Programs & Counseling Center  [address]  [address], [place], [place] [address]  [phone]  www.opica.[facility]     ONE[facility]  [address]  [place], [place] [address]  Phone: [phone]  http://www.onegeneration.org/seniors/senior-enrichment-center/    [facility] Adult Daycare  [address]  [place], [place] [address]  Phone: [phone]  http://www.onegeneration.org/seniors/adult-daycare/    [facility]:  Serving Korean-speaking and Spanish-speaking seniors residing in [place], in the [place] area.  [address]  [place], [place]  [address]  Monday through Friday, 8:00 am to 4:30 pm  [phone]  https://brandmanseniorcare.org/sherman-way-adult-day-health-care/    [facility]  [address]  [place], [place] [address]  Phone: [phone]  WrestlingMonthly.pl    Senior [facility]cerns Adult Day   [address]  [place], [place] [address]  Phone: [phone]   http://www.seniorconcerns.org/Programs/AdultDayCenter/tabid/385/Default.aspx     [facility]  [address]  [place], [place] [address]  Kevon Pellegrini Ratto, director, at [phone], ext [phone].  https://peterson.info/    [facility]  [address]  [place], [place] [address]  Phone: [phone]    Memory Care program through [facility] Longevity ~ [employer] for Neuroscience and Human Behavior (Hybridville.nl)    Brain Train at [employer]: Brain Train The Brain Train is a program for individuals experiencing early-stage memory loss, and their care partners. The program helps to enhance and prolong participants? quality of life and keep them independent for as long as possible; it also helps reduce stress levels for care partners. Activities stimulate the mind, body and spirit and provide opportunities for social communication and friendship.   HugeHand.is.html    Sessions with a Personal Trainer:  Curlie Doughty, NSCA-CPT, ACE-GFL  www.trainwithsusan.com  [phone]    Alzheimer's [place]: Memories in the Making Program:  Memories in the Making (MiM) is a unique fine arts program for people with Alzheimer?s disease or other dementias that offers a creative and non-verbal way of communicating and capturing precious moments through art.  To find a MiM class in your community contact Clarance Cristal [email or web address], [phone]    Zinnia TV - consolidated programs, activities, music for persons living with dementia  Link: DifferentGeneration.co.uk  One-week Trial https://watch.zinniatv.com/checkout/subscribe/purchase?_gl=1*7u9jxq*_gcl_au*Nzc3MDc1NzA1LjE3MjI1MzY1MDk.   Ames Bakes  This is an individual license that allows Zinnia to be watched on up to 3 devices. Businesses and communities requiring additional logins, or interested in licensing our content are invited to contact us  for pricing at http://[email or web address]/  https://www.munoz-bell.org/     Club 1527: The Club [employer] program offers a variety of EXERCISE classes (Qi Chualar, Tai Chi, Chair Yoga, Zumba Gold and others), and GAMES (bridge, chess, mah jong and others), including CREATIVE ARTS classes in writing and drawing.  There are also PERSONAL GROWTH & DEVELOPMENT programming (meditation, conversational foreign language and English classes) as well as EXCURSIONS that include educational performing arts trips as well as outings to museums, exciting local and foreign sites and much more.  NightlifePreviews.ch    Union Extension OLLI: The [employer] (OLLI) at [place] provides a unique opportunity for individuals age 65+ to engage in low-cost, non-credit  programs amongst a world-wide community of lifelong learners.  OLLI members get access to formal lecture courses, discussion groups, creativity and movement courses, 1-day seminars, special events. interest groups, leadership & intergenerational volunteer opportunities at [employer].  OLLI also offer free non-academic events--such as [employer], film screenings, speaker series, and more--exclusively for OLLI at [employer]. Courses and events are offered in a mix of formats: in-person, remote, and hybrid.  For more information visit: BarMitzvahSearch.dk    [facility]: [facility] Emer[facility], started in 1975, is a college for older adults. A program of [address], Karsten Page offers over 120 free classes of interest to seniors. All classes are held during the day at convenient locations throughout the community or at its home base at [address], [place]. For more information, call the [place] office at [phone] or visit their website: http://www.smc.edu/AcademicAffairs/Emeritus/Pages/About-Us .aspx    [facility] Arts and Crafts Classes: PeaceSeek.Dushore    [employer] Encore: Arthuro Lasso is a [employer] program designed specifically for older adults in our community. ENCORE offers free noncredit classes and fee-based, not for credit classes. Courses address the educational needs and interests of older adults, focusing on topics that promote independence, advocacy, community engagement, self-maintenance, personal growth and development, physical and cognitive health, economic self-sufficiency and includes subject matter that relates to older adults? life circumstances and positive aging. ENCORE Noncredit Classes generally meet for 2 hours a week for 15 weeks during the Fall and Spring semesters and for 5 weeks during the Winter and Summer semesters. Students may enroll at any time throughout the semester, if space is available.  For morn information visit: jaboxlightings.com or for an appointment or to leave a message, please call [phone].    Memories in the Making Fine Arts Program with Alzheimer's Organization: GenitalDoctor.nl.asp    Music Mends Minds is a musical support groups for patients with Alzheimer?s, dementia, Parkinson?s, traumatic brain injury, stroke, and PTSD. These musical support groups foster a community between the musicians and singers, as well as their  families, friends, and caregivers, all of whom thrive on the socialization and music making.http://www.musicmendsminds.org/#!participate/c29mme   Global Online Sing-A-Long Sessions(new virtual programming in place)  MMM is offering free online sing-a-long sessions every Monday, Wednesday, & Friday from 1 PM - 2 PM PST that is available for anyone to join!   A [place]eli Music Board-Certified Music Therapist conducts musical sessions 3X a week to sing along with us  and guide our seniors.   This is a great way to safely stay connected with others during the COVID-19 pandemic.   These free sessions are offered through ZOOM!   Please contact us  through email [email or web address] or call us  at [phone] for instructions on how to join! If you need help connecting, we can walk you through the process!  To join our Zoom sessions, please CLICK HERE or the link below:  [email or web address]  (If you do not have Zoom installed, you will be prompted to install Zoom--follow the on-screen instructions and click the link again to join)  Meeting ID: [phone]  [place]?s Time Out Program (new virtual programming in place) - this is a Tourist information centre manager for patients with memory loss and [employer] students who are interested in careers in gerontology. The students are trained to do mentally-stimulating activities with patients, including playing cards, chess, computer, crafts, or conversation.  Contact: Samie Crews  [phone]  MaxBlogs.de    YouthCare: this is a respite program available for caregivers.  They pair individuals with dementia with a highschool or undergraduate students.  They ensure a safe environment by providing clinical oversight for all student-older adult pairs.  http://hutchinson-wells.com/    [employer] and Cultural Services - [place] of [facility]  https://ellis-hammond.com/.aspx?id=27480     Oversees programs and supportive services for:  Youth and Families  Seniors  People with Disabilities  Low income or homeless individuals     SOCIAL- Care Plan  CAREGIVER EDUCATION: *** would benefit from learning more about Dementia,  its progression and ways to take care of themselves while caring for [employer].  Plan: Consider viewing [place]'s Alzheimer's Caregiver Education Series of webinars at: [email or web address].white-williams.com/    Plan: Dementia Care Specialist would recommend *** undergoing caregiver training. Below are available options:  The Savvy Caregiver Program, through the Alzheimer's Association: The Savvy Caregiver and Savvy Caregiver Express are programs specially designed to benefit people caring for a family member or friend living with dementia by providing more understanding and tools to help navigate the journey. https://wu.com/    Learning @Home  through Alzheimer's [place]: Alzheimer's [place] continues to provide educational programs for families and individuals facing Alzheimer?s or another dementia as well as for community members. These Learning@Home  programs are free of charge and are offered in the comfort of your own home. WarJungle.nl    Plan: Please review the below resource to consider self care considerations: https://www.caregiver.org/caregiver-resources/caring-for-yourself/    Plan: Eliona Mcnerney's family may find Alzheimer's Association caregiver training resources helpful: https://thomas-smith.info/  Understanding Alzheimer's and Dementia  Dementia Conversations: Driving, Doctor Visits, Legal & Financial Planning  Effective Communication Strategies  Healthy Living for Your Brain and Body: Tips from the Latest Research  Legal and Financial Planning for Alzheimer's Disease  Living with Alzheimer's for People with Alzheimer's  Living with Alzheimer's: For Caregivers - Early Stage  Living with Alzheimer's: For Caregivers - Middle Stage  Living with Alzheimer's: For Caregivers - Late Stage  Living with Alzheimer's for Younger-Onset Alzheimer's  Understanding and Responding to Dementia-Related Behavior    MILD CAREGIVER BURDEN: ***  serves as Oceanographer caregiver and has a Modified Caregiver Strain Index score of 15, Zarit Score of ***, and  PHQ-9 score of 2. Reshell Porro?s family would benefit from speaking to other people that are caring for loved ones with dementia.  Plan: Consider undergoing caregiver training through The Savvy Caregiver Program, through the Alzheimer's Association:   The Savvy Caregiver and Savvy Caregiver Express are programs specially designed to benefit people caring for a family member or friend living with dementia by providing more understanding and tools to help navigate the journey. https://wu.com/    Plan: Consider attending a dementia caregiver support group   The Jerilynn Montenegro. [place] Alzheimer's and Dementia Care Program Caregiver Support Groups: http://www.gould-leon.com/    Alzheimer's [place] [employer] of Events: BirthTest.pl   Alzheimer's [place] Support Groups: MemorabiliaClub.cz     Warehouse manager Events: MarathonSkier.co.uk   Pharmacist, hospital Support Groups: https://www.alz.org/socal/helping_you/support_(1)     Alzheimer's Association American Express Events: NarrativeFilms.com.cy   Alzheimer's WellPoint Support Groups: TerrificApps.com.br    MILD TO MODERATE CAREGIVER BURDEN: *** serves as Museum/gallery conservator primary caregiver and has a Insurance underwriter Strain Index score of 15, Zarit Score of ***, and  PHQ-9 score of 2. Rashanti Stokes?s family would benefit from speaking to other people that are caring for loved ones with dementia.  Plan: Consider the need for individual counseling. Provided vouchers for a trial of individual counseling supported by the [place] Alzheimer's and Dementia Care Program.    Plan: Consider undergoing caregiver training through The Savvy Caregiver Program, through the Alzheimer's Association:   The Savvy Caregiver and Savvy Caregiver Express are programs specially designed to benefit people caring for a family member or friend living with dementia by providing more understanding and tools to help navigate the journey. https://wu.com/    Plan: Consider the need for support group or individual counseling if needed in the future.  The Jerilynn Montenegro. [place] Alzheimer's and Dementia Care Program Caregiver Support Groups: http://www.gould-leon.com/    Alzheimer's [place] [employer] of Events: BirthTest.pl   Alzheimer's [place] Support Groups: MemorabiliaClub.cz     Alzheimer's Association [place] Educational Events: MarathonSkier.co.uk   Pharmacist, hospital Support Groups: https://www.alz.org/socal/helping_you/support_(1)     Alzheimer's Association American Express Events: NarrativeFilms.com.cy   Alzheimer's WellPoint Support Groups: TerrificApps.com.br    Plan: Recommended *** follow up with their primary physician to be evaluated for depressive symptoms.    MODERATE TO SEVERE CAREGIVER BURDEN: *** serves as Museum/gallery conservator primary caregiver and has a Modified Caregiver Strain Index score of 15, Zarit Score of ***, and  PHQ-9 score of 2. Tamryn Foland?s family would benefit from speaking to other people that are caring for loved ones with dementia.  Plan: Referral placed for caregiver respite:***    Plan: Consider the need for individual counseling. Provided vouchers for a trial of individual counseling supported by the [place] Alzheimer's and Dementia Care Program.    Plan: Referral placed to [facility] Social Worker for Psychoeducation.    Plan: Recommended *** follow up with their primary physician to be evaluated for management of depression.    CAREGIVER RESPITE:   Plan: Consider applying for respite services through Alzheimer's [facility] Counseling Program   The care counseling program matches care counselors (social workers) to caregivers. Social workers work one-on-one with caregivers to provide emotional support, help navigate dementia-related behaviors, develop a care plan, and help access resources.  In addition, free caregiver  support groups, caregiver education workshops, and limited respite grants are available.        The process to establish care:   Call the helpline at [phone] and express interest in care counseling.  Complete over-the-phone intake and provide basic demographic information.   Sign consent for needed services    The caregiver will be connected with a care counselor.    Please note: No proof of Dementia or Alzheimer's diagnosis is needed.       Respite Norberta Beans    Limited respite grants are available depending on funding. The caregiver must be enrolled in the care counseling program to access a respite grant.   Funded by the [place] of [place]     Provides up to 40 hours of respite care that needs to be utilized within three months.    Caregivers can reapply for the grant after using the 40 hours; however, unused hours cannot be rolled over.    Pre-selected home care agencies provide respite care.     Plan: Consider applying for respite services through the Alzheimer's Association.  The Alzheimer?s Association is currently accepting applications for respite care grants. Those that made these monies available to local folks caring for one with dementia know exactly what you?re experiencing as you care for your loved one.  Respite from caregiving duties that encompass one?s daily life are indeed challenging and tiring and certainly benefit from time away.     Our respite grants have four very specific requirements.   Please contact Araksi, Educator and Care Specialist, for more information:  [phone] ext. [phone]     Plan: Consider applying for respite services through The Dementia Home Care Norberta Beans (formally Hilarity for [facility]): https://www.helpforalzheimersfamilies.com/get-help/hilarity-for-charity/es  Recharge Respite Norberta Beans  The [employer] Norberta Beans is a one-time grant of 50 hours of respite care to be used within 3 months of being awarded. If awarded a Acupuncturist, upon completion of this grant, applicants are able to reapply for another grant if needed. This grant is designed to provide respite to family caregivers that need to take time to personally recharge and focus on themselves.    Grant Eligibility  To be eligible for the [facility] or the Extended Relief Respite Norberta Beans, the caregiver(s) or loved one living with Alzheimer's & dementia must be professionally diagnosed with Alzheimer's or a related dementia and fit within the following criteria.  Currently living at home with Alzheimer's disease or related dementia.  Caregiver(s) is facing financial and emotional hardships due to the unique challenges of Alzheimer's or related Dementia.  Resides in the [place]  or [place].  *Only one application per person will be accepted on a quarterly basis. Norberta Beans program not available in [place] or other [place]  territories.    Plan: Consider applying for respite services through [facility] for patients with FTD   COMSTOCK Grants for those patients with FTD and those families in the FTD community.     Respite Grants  For full-time, unpaid care partners     We know how difficult it can be for care partners to meet their own needs while caring for a loved one at home. Comstock Lexicographer help full-time, unpaid care partners attend to their own emotional, psychological and physical well-being. The maximum annual grant is $500/family. Recipients may use the funds on respite care, mental health support, or self-care (yoga, exercise class) that best meets their needs. There are few requirements other than being the full-time, unpaid care partner  to a loved one who has a documented diagnosis of FTD.     Complete the Respite Research officer, political party a printable FY 2024 Respite Norberta Beans application PremiumBot.com.cy  Respite Norberta Beans awardees can complete the [employer] Reimbursement form online lazyitems.com.pdf     Quality of Life Grants  For persons living with FTD     This Norberta Beans helps people living with FTD access services or supports to improve their quality of life. Applicants with a documented FTD diagnosis may apply for a $500 grant that can be used, with the support and supervision of a primary care partner as necessary, for the goods or services of their choosing. Individuals may apply once per AFTD fiscal year (July 1 - June 30). Starting July 1. 2023 AFTD will no longer be sending [place] cards. Norberta Beans awardees can submit for reimbursement using the form below. If this poses any hardships or difficulties please reach out to [email or web address] for assistance.     Complete the Quality of Life Application online ExactWorth.hu  Download a printable FY 2024 Quality of Life application https://www.cox.org/.pdf  Quality of Life Norberta Beans awardees can complete the Quality of Life Armed forces operational officer  For persons living with FTD and their family member who are care partners.     This Norberta Beans provides up to $500 in financial assistance to make it possible to attend the [employer] or other FTD educational event approved by AFTD. One grant is available per family.     Complete the Travel Conservation officer, historic buildings ChildGaming.pl  Download a printable FY 2024 Travel Grant application PurchaseFilters.at.pdf    RECIPIENT OF MEDI-CAL SERVICES: Armeda Iacovelli?s family reported enrolled in Home and Community Based Services: []  ADHC Services []  IHSS []  Assisted Living Waiver waiting list  Plan: Referral placed to [facility] Social Worker for care coordination with [employer] and [employer] case Financial controller.    BENEFITS COUNSELING: Dashawna Cleckley?s family expressed interest in being considered for MediCal benefits.  Plan: Will have MediCal screening completed for consideration for benefits counseling    IN HOME CAREGIVER SUPPORT:Freddy Lio?s family expressed interest in being considered for: []  Private Caregiver Services []  IHSS []  MediCal Spousal Impoverishment  Plan: Referral placed to [facility] Social Worker to assist with coordination of in-home care.    VETERAN SERVICES: Please inquire about Moxie Forgacs ability to quality for Home Aide and Attendance Application Process. Aid and Attendance Benefit - This is for a veteran who has served at least 90 days in a war and/or their spouse. There are specific criteria in order to qualify for  the benefit and an application process, but the monthly amount is significant and worth researching.  ChatRepair.pl or www.RipHit.se.   Plan: Dementia Care Specialist will refer Genia Hannay?s family to the [place] resource book: https://www.calvet.PlayMommy.fr.pdf and to their local Research officer, political party for enrollment and inquiry about what services Maryhelen Mceleney will qualify for.  You may call the VA Eligibility Office [phone] or the number for [facility] [phone], and patient?s/family members should ask to speak to a Child psychotherapist in regards to enrollment benefits.   You may enroll on-line at HighDefinitionTheatre.se (Highly recommended by the [place] office)  You may call the [facility] telephone number [phone]).    Plan: Below are a list of agencies that assist with [employer].  Veteran's Affairs at [employer]: [phone]  Veteran's Affairs at [employer]: [phone]  BJ's Support [phone] Www.natvetsupport.[facility] Coordination [phone] http://www.veteranscarecoordination.com/  Vet Centers: [phone]; [phone]    NUTRITION:   Plan: Below are a list of resources to arrange home delivered meals.  Consider benefits counseling with Agustina Aldrich and Healthy Aging for CalFresh.  One Generation: Raynaldo Call, Program Manager - Case Management at [phone] for information  Meals on Wheels: MemoOrganizer.be  Jewish Family Service Senior Nutrition Program: CreditGaming.fr.aspx?pid=227  Martha's Senior Gourmet: www.marthasseniorgourmet.com   The Silver Pantry: https://www.thesilverpantry.com/menu  Chefs for Seniors: https://chefsforseniors.com/    PLACEMENT PLANNING: Ziara Lafosse?s family indicated they would be interested in receiving consultation or help in planning for boarding care and assisted living facility placement.  Plan: Dementia Care Specialist will refer the family to:  [facility]  [place] [address]  [facility], [employer].  [address] [place] [address]  [phone]    [facility]  [facility]  [address]  [place], [place] [address]  [phone]      Orrie Blake  Community Relations for [facility] Living   [phone] [phone]   www.BlogSelections.co.uk    Plan: Dementia Care Specialist recommends submitting an application for the Assisted Living Waiver Program, a subsidized housing benefit under MediCal. http://www.dhcs.PlusApps.si.aspx     LONG-TERM CARE PLANNING: Mayetta Jeune expressed wishes to remain living in at home as long as possible; however is noted to have increasing care needs with limited financial resources and no family or friends that are able to assist.    Plan: Dementia Care Specialist will refer Amen Mikula to the Affordable Living for the Aging Program: http://www.alaseniorliving.org    At Affordable Living for the Aging (ALA) provides affordable housing, supportive services, and alternative housing options for low-income and formerly homeless seniors.     In [employer] program, ALA matches two or more unrelated people to share a home in exchange for rent or services such as cleaning or cooking. Roommate arrangements for the baby boomer generation and beyond have grown in popularity as they offer financial benefits and shared resources. In [place], where there is a Geneticist, molecular of affordable housing, shared housing offers an array of benefits for both the housing provider and the seeker.  Housing Providers are homeowners or renters (typically older adults) who want to share their homes, and have at least one available bedroom. Housing Providers join the program to share their homes with compatible, pre-screened Housing Seekers who can pay a monthly rent for the available room, offer assistance with household chores, or both. Housing Providers must be mentally, emotionally, physically and financially self-sufficient. Currently, the average age of our housing providers is 36.  ACCESS TO COMMUNICATION: Rosebelle Edgeworth has difficulty using the telephone independently due to ***.   Plan: Please consider obtaining a free device from [place]  Ability phones https://californiaphones.org/    Plan: Please consider obtaining a free device from Caption Call: https://keller.info/      LEGAL SERVICES: Remi Donahoe?s family requested information on legal services.   Plan: Alzheimer's [place] Caregiver Tips Video 7: Legal Planning SatelliteRebate.it  Local Legal & Financial Planning resources https://fuller.com/  Find a Sales promotion account executive class http://www.harvey.com/    Plan: Health Consumer Center [facility]) can help when you have questions about Medi-Cal, Medicare, [employer] and Covered [place] . They assist with getting, keeping, and using your health coverage and benefits.   PodcastRanking.se  TalkingApps.com.br  Plan: Dementia Care Specialist will refer Stacye Doughman?s family to [facility] elder law firm for assistance.   Sharrie Deed  Paralegal, Family Caregiver Unit  [address] [place], [place]  [address]  [phone] - direct [phone] - fax  [email or web address]  [email or web address]        Time at end of visit: ***     ***minutes were spent personally by me today on this encounter which include today's pre-visit review of the chart, obtaining appropriate history, performing an evaluation, documentation and discussion of management with details supported within the note for today's visit. The time documented was exclusive of any time spent on the separately billed procedure.      Future Appointments   Date Time Provider Department Center   02/24/2024 11:30 AM Geofm Killer, Samantha Cress., Jodi Bentley HEMONCENC [facility]   02/26/2024  1:30 PM Cecilio Coffer., Jodi Bentley [facility]   02/28/2024 12:00 PM EIC PCT01 PET EIC [facility]   03/05/2024  4:40 PM Lear Prosper., Jodi Bentley HEM/ONC [facility]   07/06/2024  2:00 PM Berton Brock., Jodi Bentley [facility]/        Yolonda Henderson. Stevenson Elbe, Jodi Bentley  Dementia Care Specialist  [place] [facility]'s and Dementia Care [facility]  Phone: [phone]  Fax: [phone]

## 2024-02-20 ENCOUNTER — Other Ambulatory Visit: Payer: TRICARE (CHAMPUS)

## 2024-02-20 ENCOUNTER — Telehealth: Payer: TRICARE (CHAMPUS)

## 2024-02-20 NOTE — Goals of Care
 Advance Care Planning   Goals of Care     Advance Directive: Yes  (on file: yes) Dated 03/11/2023   DPOA: Delma Fern (husband)  First Alternate Decision Maker: Zollie Hipp and Sandre Crown as co-agents    What Matters Most: To stay comfortable in her current memory care facility    POLST: no     Goals of Care Discussion:  Discussed POLST in detail with patient and family. Upon discussion, the following treatment options were verbalized:  Part A: [] Full code [x] DNR  Part B: []  Full Treatment  []  Trial Period of Full Treatment  [x] Selective treatment [] Comfort focused treatment  Part C: []  Agreeable to long-term artificial nutrition, including feeding tubes                []  Agreeable to trial of artificial nutrition, including feeding tubes                [x] Not agreeable to artificial means of nutrition, including feeding tubes.    It is my opinion that the medical decision-making capacity of Dianca Lechman, at the time of this visit is:  Able to make his/her own medical decisions  []    Not able make his/her own medical decisions  []    Uncertain - May require additional testing  [x]          [name], DNP, [facility]  Dementia Care Specialist  [place] [facility]ementia [facility]  Date: 02/20/2024

## 2024-02-20 NOTE — Addendum Note
 Addended by: Jakari Jacot TRICIA on: 02/20/2024 11:35 AM     Modules accepted: Orders

## 2024-02-20 NOTE — Telephone Encounter
 Hi Dora and Crystal,    Please see new referrals for respite (I prefer [facility]) and counseling with Christel Cousins Foley.    Thank you,  [name], DNP, [facility]  Dementia Care Specialist   Alzheimer's and Dementia Care Program  Date: 02/20/2024

## 2024-02-21 ENCOUNTER — Other Ambulatory Visit: Payer: TRICARE (CHAMPUS)

## 2024-02-24 ENCOUNTER — Telehealth: Payer: TRICARE (CHAMPUS)

## 2024-02-24 ENCOUNTER — Other Ambulatory Visit: Payer: TRICARE (CHAMPUS)

## 2024-02-24 ENCOUNTER — Ambulatory Visit: Payer: TRICARE (CHAMPUS) | Attending: Gastroenterology

## 2024-02-24 MED ADMIN — LANREOTIDE ACETATE 120 MG/0.5ML SC SOLN: 120 mg | SUBCUTANEOUS | @ 19:00:00 | Stop: 2024-02-24

## 2024-02-24 NOTE — Telephone Encounter
 Spoke with daughter [name], she confirmed pt has Tricare for life active as secondary insurance.    Thank you.

## 2024-02-26 ENCOUNTER — Ambulatory Visit: Payer: TRICARE (CHAMPUS) | Attending: Student in an Organized Health Care Education/Training Program

## 2024-02-26 DIAGNOSIS — Z7189 Other specified counseling: Secondary | ICD-10-CM

## 2024-02-26 DIAGNOSIS — G309 Alzheimer's disease, unspecified: Secondary | ICD-10-CM

## 2024-02-26 DIAGNOSIS — R443 Hallucinations, unspecified: Secondary | ICD-10-CM

## 2024-02-26 DIAGNOSIS — F0280 Dementia in other diseases classified elsewhere without behavioral disturbance: Secondary | ICD-10-CM

## 2024-02-26 MED ORDER — QUETIAPINE FUMARATE 25 MG PO TABS
25 mg | ORAL_TABLET | Freq: Every evening | ORAL | 3 refills | 45.00000 days | Status: AC
Start: 2024-02-26 — End: 2024-03-05

## 2024-02-26 NOTE — Progress Notes
 OUTPATIENT NEUROPALLIATIVE CARE / NEURO SUPPORTIVE CARE VISIT  MRN: [id number] CSN: [id number]     Referring Provider: Dr. Erlinda Haws  Reason for Consult: Support for patient/family  Primary Diagnosis leading to Consult: Neurologic/Stroke/Neurodegenerative  Neuropalliative Diagnosis: Dementia    Patient Consent to Telehealth   The patient agreed to participate in the video visit prior to joining the visit.      HISTORY OF PRESENT ILLNESS  Jodi Bentley is a 83 y.o. female, with Alzheimer's Disease and metastatic NET, and other past medical history as noted, presents to neuropalliative care clinic.     Presents via video w daughter [name]. Contributes to discussion but cannot provide medical history. [name] notes that Dr. Marylene Snuffer reports that the growth is very slow and plan is to continue current therapy. [name] feels that her symptoms are impacting her QOL.     Sx: No pain. Has significant anxiety which can cause HTN up to SBP 200 to 220. Also has fluctuating constipation, diarrhea, and fatigue, as well as depression/fatigue. Has frequent falls. She is having some frequent soft stools and [name] believes she is receiving immodium. She is on the memory wing of the [facility]. Fatigue has been an issue for many years.     For her anxiety, she has taken a variety of medications over the years, currently on venlafaxine  and buspar, and finds this helpful. Sees psychiatrist Dr. Carlis Cherry in [facility].     [name] is her verbally appointed health surrogate. AHCD lists husband, but there is no conflict on decision making.     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 Likes to look forward to events--for example will occasionally go out to see a friend.     She spontaneously shares that she does not want to have a prolonged EOL experience with AD, and defines her minimal acceptable quality of life as meaningful communication with loved ones.     Interval Events 02/12/24  -Has a lot of fluctuations in mental status--had a period of poor ambulation, significant fatigue. She was evaluated for UTI but this was negative. The staff at the nursing home noted she has had a decline. There are other days when she is doing well.   -She is maintaining her weight--using ensure drinks to supplement. At 130 pounds, which is his normal.   -No recent hospitalizations.  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 She also has a hallucination/ delusion that children are visiting and drawing on her paintings. She feels that children sleep on the furniture.   -She has frequent falls, more than 100 times, broke her hip once.   -Discussion regarding medical decision making today.    Interval Events 02/26/24  -She feels body aches today and has taken APAP for this, She did report SOB on the questionnaire but reports that this is with activity. Difficult for her to characterize this.   -Started seroquel  12.5mg  at bedtime about 10d ago--and she saw Annamae Barrett who recommended increasing to 25mg  nightly. They are unsure if there has been benefit with this. [name] notes she iis having ''so many visitors'' and does not want this. She reports she has not seen any of the children lately.       Patient Active Problem List   Diagnosis    History of fall    Other malignant neuroendocrine tumors (HCC/RAF)    Balance problem    Impaired mobility    Severe  hearing loss    Unspecified mood (affective) disorder (HCC/RAF)    GAD (generalized anxiety disorder)    Mild major depression    OSA (obstructive sleep apnea)    Dysautonomia (HCC/RAF)    Dizziness    Abnormal brain MRI    Osteoporosis without current pathological fracture    [facility]    Closed compression fracture of body of L1 vertebra (HCC/RAF)    Urinary tract infection due to extended-spectrum beta lactamase (ESBL) producing Escherichia coli         Reviewed Notes  I reviewed these specialist notes that directly relate to the patient's acute and/or chronic medical problems and incorporated them into the patient assessment      Current Outpatient Medications   Medication Sig    amLODIPine  10 mg tablet Take 10 mg once daily as needed for systolic blood pressure above 098 mmHg.    bisacodyl (DULCOLAX) 5 mg EC tablet Take 1 tablet (5 mg total) by mouth daily as needed for Constipation.    busPIRone 15 mg tablet Take 1 tablet (15 mg total) by mouth three (3) times daily.    Cholecalciferol (VITAMIN D3) 10 mcg (400 units) CAPS Take by mouth daily.    lidocaine  5% patch APPLY 1 PATCH TOPICALLY DAILY (12 HOURS ON AND 12 HOURS OFF) TO LOWER BACK    LORATADINE  10 mg tablet TAKE 1 TABLET DAILY    pancrelipase , Lip-Prot-Amyl, (CREON ) 36000 units DR capsule Take 1 capsule (36,000 units of lipase total) by mouth three (3) times daily with meals.    pantoprazole  40 mg DR tablet Take 1 tablet (40 mg total) by mouth daily.    QUEtiapine  25 mg tablet Take 0.5 tablets (12.5 mg total) by mouth at bedtime.    rivastigmine  4.5 mg capsule Take 1 capsule (4.5 mg total) by mouth two (2) times daily before meals.    venlafaxine  150 mg 24 hr capsule Take 2 capsules (300 mg total) by mouth daily.     No current facility-administered medications for this visit.     Facility-Administered Medications Ordered in Other Visits   Medication Dose Route Frequency    [COMPLETED] lanreotide acetate  120 mg/0.5 mL inj 120 mg  120 mg Subcutaneous Once       ALLERGIES  Ace inhibitors, Sulfasalazine, and Sulfa antibiotics    PHYSICAL EXAM  There were no vitals taken for this visit.  PHYSICAL EXAM VIA TELEMEDICINE   Physical Exam by Observation Only due to telemedicine visit   General: alert, awake, and no acute distress  Lungs: regular breathing pattern, no use of accessory muscles, and no cyanosis  Psych: pleasant, cooperative, and engaging in conversation  Neuro: Speech is tangential. Cannot provide medical details. Some perseveration.     LABS AND STUDIES  I have:   []  Reviewed/ordered []  1 [x]  2 []  >= 3 unique laboratory, radiology, and/or diagnostic tests noted below    []  Discussed management or test interpretation with external provider(s) as noted       Lab Studies:  CBC:   Results for orders placed or performed in visit on 02/24/24   CBC & Plt & Differential   Result Value Ref Range    WBC (LabDAQ) 11.46 (H) 3.98 - 10.04 10??/uL    RBC (LabDAQ) 4.01 3.90 - 6.10 10X6/uL    Hemoglobin (LabDAQ) 12.6 11.2 - 15.7 g/dL    Hematocrit (LabDAQ) 38.5 34.1 - 44.9 %    MCV (LabDAQ) 96.0 (H) 79.0 -  94.8 fL    MCH (LabDAQ) 31.4 25.6 - 32.2 pg    MCHC (LabDAQ) 32.7 32.2 - 36.5 g/dL    RDW-CV (LabDAQ) 98.1 11.6 - 14.4 %    Platelets (LabDAQ) 173 163 - 369 10??/uL    MPV (LabDAQ) 10.3 9.4 - 12.4 fL    Neutrophil % (LabDAQ) 64.7 34.0 - 71.1 %    Lymphocyte % (LabDAQ) 22.4 19.3 - 53.1 %    Monocyte % (LabDAQ) 7.2 4.7 - 12.5 %    Eosinophil % (LabDAQ) 4.6 0.7 - 7.0 %    Basophil % (LabDAQ) 0.8 0.1 - 1.2 %    Neutrophil # (LabDAQ) 7.40 (H) 1.56 - 6.13 10??/uL    Lymphocyte # (LabDAQ) 2.57 1.18 - 3.74 10??/uL    Monocyte # (LabDAQ) 0.83 0.24 - 0.86 10??/uL    Eosinophil # (LabDAQ) 0.53 0.04 - 0.54 10??/uL    Basophil # (LabDAQ) 0.09 (H) 0.01 - 0.08 10??/uL    IG% (LabDAQ) 0.3 0.0 - 0.5 %    IG# (LabDAQ) 0.04 (H) 0.00 - 0.03 10??/uL    Narrative    NOTE: IG = Immature Granulocytes       CMP:   Results for orders placed or performed in visit on 02/24/24   Comprehensive Metabolic Panel, Serum   Result Value Ref Range    Sodium 144 135 - 146 mmol/L    Potassium 4.6 3.6 - 5.3 mmol/L    Chloride 103 96 - 106 mmol/L    Total CO2 26 20 - 30 mmol/L    Anion Gap 15 8 - 19 mmol/L    Glucose 92 65 - 99 mg/dL    Creatinine 1.91 (H) 0.60 - 1.30 mg/dL    Estimated GFR 37 See GFR Additional Information mL/min/1.27m2    GFR Additional Information See Comment     Urea Nitrogen 20 7 - 22 mg/dL    Calcium 9.7 8.6 - 47.8 mg/dL    Total Protein 6.5 6.1 - 8.2 g/dL Albumin 4.3 3.9 - 5.0 g/dL    Bilirubin,Total 0.5 0.1 - 1.2 mg/dL    Alkaline Phosphatase 94 37 - 133 U/L    Aspartate Aminotransferase 24 13 - 62 U/L    Alanine Aminotransferase 16 8 - 70 U/L        No results found for requested labs within last 90 days.       Imaging Studies:   Last MRI Brain: No MRI Brain in the last year      IMPRESSION  Athelene Bodenstein is a 83 y.o. female, with Alzheimer's Disease and metastatic NET, and other past medical history as noted, presents to neuropalliative care clinic.     #Anxiety and depression:   -Continue buspar and venlafaxine , is following with Dr. Carlis Cherry     #Hallucinations: vivid visual hallucinations and delusions of children in home, capgras delusion regarding husband. Discussed black box warning of atypical antipsychotics and dementia and inc risk of falls. Decided on cautious low and slow up titration of seroquel .   -inc seroquel  to 25mg  nightly.     #Advance Care Planning:   -Has AHCD in chart and lists husband as DPOA. Daughter [name] present today and Verda Gist identifies her as health surrogate. [name] shares she would make decisions jointly with husband.   -Verda Gist shares her concerns that she may live to a quality of life that is unacceptable to her due to her AD and asks about how to avoid this. Discussed restrictions around [facility] access, but that  advance care planning can be used to reduce life prolonging care in undesirable states, which she would like to plan for. She defines her minimal acceptable quality of life as retaining the ability to have meaningful communication with loved ones.   -Capacity: [name] is unable to provide medical history but has provided her philosophy of care and can share her overall wishes.   -02/12/24: advance care planning discussion today regarding expected trajectory in Alzheimer's disease, and possible modes of care, including when [name] might be eligible for hospice from progression of her disease versus a new acute issue. [name] feels that there are some good days that [name] contineus to have but overall, her current quality of life she believes likely is not acceptable to [name] and [name] would likely not want life prolonging care. Discussed electing for comfort care in setting of acute illness such as pneumonia in order to allow for natural passing and not prolong life.   -She completed a POLST with Willodean Hartshorn DNP of the dementia care program which is in the chart.     No addressed:   #Diarrhea/constipation  -Record stool frequency and consistency and medications used so we may work on this sx    -----    I am managing the serious complex conditions discussed above longitudinally for this patient and assuming the associated risk of management over time.  20 minutes were spent personally by me today on this encounter which may include today's pre-visit review of the chart, time spent during the visit, and today's time spent after the visit documenting and coordinating care.        Electronically signed by:    Waldemar Guillaume, MD  Departments of Neurology and Palliative Care

## 2024-02-27 ENCOUNTER — Telehealth: Payer: TRICARE (CHAMPUS)

## 2024-02-27 NOTE — Telephone Encounter
 Hi Dora,     Pls assist in processing:

## 2024-02-28 ENCOUNTER — Inpatient Hospital Stay: Payer: TRICARE (CHAMPUS) | Attending: Gastroenterology

## 2024-02-28 DIAGNOSIS — R197 Diarrhea, unspecified: Secondary | ICD-10-CM

## 2024-02-28 DIAGNOSIS — C7A8 Other malignant neuroendocrine tumors: Secondary | ICD-10-CM

## 2024-02-28 MED ADMIN — GALLIUM GA 68 DOTATATE IV: 5.4 | INTRAVENOUS | @ 19:00:00 | Stop: 2024-02-28

## 2024-02-28 MED ADMIN — IOHEXOL 350 MG/ML IV SOLN: 100 mL | INTRAVENOUS | @ 19:00:00 | Stop: 2024-02-28

## 2024-02-28 MED ADMIN — IOHEXOL 350 MG/ML IV SOLN: 100 mL | INTRAVENOUS | @ 20:00:00 | Stop: 2024-02-28

## 2024-02-28 MED ADMIN — IODIXANOL 320 MG/ML IV SOLN: 100 mL | INTRAVENOUS | @ 20:00:00 | Stop: 2024-02-28

## 2024-03-02 ENCOUNTER — Other Ambulatory Visit: Payer: TRICARE (CHAMPUS)

## 2024-03-02 ENCOUNTER — Inpatient Hospital Stay
Admit: 2024-03-02 | Discharge: 2024-03-02 | Disposition: A | Discharge status: Home / Self Care | Payer: TRICARE (CHAMPUS) | Source: Home / Self Care

## 2024-03-02 ENCOUNTER — Ambulatory Visit: Payer: TRICARE (CHAMPUS)

## 2024-03-02 DIAGNOSIS — S0990XA Unspecified injury of head, initial encounter: Secondary | ICD-10-CM

## 2024-03-02 NOTE — ED Notes
 Pt able to stand and walk with one person assit.

## 2024-03-02 NOTE — ED Provider Notes
 [facility]  Emergency Department Service Report    Jodi Bentley 83 y.o. female , presents with Head Injury    Triage   Arrived on 03/02/2024 at 2:55 AM   Arrived by ALS [12]    ED Triage Vitals   Temp Temp Source BP Heart Rate Resp SpO2 O2 Device Pain Score Weight   03/02/24 0257 03/02/24 0257 03/02/24 0257 03/02/24 0257 03/02/24 0257 03/02/24 0257 03/02/24 0257 03/02/24 0258 03/02/24 0258   36.9 ?C (98.4 ?F) Oral 164/88 70 18 98 % None (Room air) Four 59 kg (130 lb)       Pre hospital care:       Allergies   Allergen Reactions    Ace Inhibitors Cough     Other reaction(s): Other (See Comments)  cough  cough  cough      Sulfasalazine     Sulfa Antibiotics Rash and Other (See Comments)         Initial Physician Contact     Medical Screening Exam Initiated       Date/Time Event User Comments    03/02/24 0258 MSE Initiated Scherry Curtis --          Medical Screening Exam - MD Comments      Date and Time MSE MD Comment User   03/02/24 0258 BIBA from assisted living for witnessed mechanical fall, hit head on counter, no KO/vomiting/thinners, hx of dementia but acting normal per staff, also c/o right hip pain, able to ambulate for EMS LMD                   History   HPI very pleasant female from a memory care assisted living status post mechanical fall complains of pain to the head as she hit her head against a counter no blood thinners she has a history of dementia is acting per her baseline.  She was ambulatory at the scene.      Language Assistance                         Past Medical History:   Diagnosis Date    Histoplasmosis         No past surgical history on file.     Past Family History   Family history reviewed by me and there is no pertinent past family history related to the patient's current case and/or care.             Past Social History   she reports that she has quit smoking. Her smoking use included cigarettes. She has never used smokeless tobacco. No history on file for alcohol use, drug use, and sexual activity.       Physical Exam   Physical Exam  Vitals and nursing note reviewed.   Constitutional:       Appearance: She is not ill-appearing.   HENT:      Head: Atraumatic.      Mouth/Throat:      Mouth: Mucous membranes are moist.   Eyes:      Conjunctiva/sclera: Conjunctivae normal.   Cardiovascular:      Rate and Rhythm: Normal rate and regular rhythm.      Pulses: Normal pulses.   Pulmonary:      Effort: Pulmonary effort is normal.      Breath sounds: Normal breath sounds.   Abdominal:      General: There is no distension.  Palpations: Abdomen is soft.      Tenderness: There is no abdominal tenderness.   Musculoskeletal:         General: Normal range of motion.      Cervical back: Normal range of motion and neck supple. No rigidity.      Right lower leg: No edema.      Left lower leg: No edema.   Skin:     General: Skin is warm and dry.   Neurological:      Mental Status: She is alert and oriented to person, place, and time.   Psychiatric:         Mood and Affect: Mood normal.         Behavior: Behavior normal.         ED Course      The patient has a normal examination as a precaution x-ray of the hip and the CT of the head were done which were unremarkable.  There is no signs of any injury.  The patient is safe to discharge home    Laboratory Results   Labs Reviewed - No data to display    Imaging Results     No orders to display       Administered Medications     Medication Administration from 03/02/2024 0255 to 03/03/2024 0243       None                       Procedures     Procedures     Medical Decision Making   Jodi Bentley is a 83 y.o. female with a history of dementia from a memory care center apparently fell no syncope CT head and pelvic x-ray were normal there is no signs of injury on the patient she looks well nontoxic and ambulates.  She is safe to discharge home.  No intracranial bleed no signs of epidural subdural hematoma no signs of any orthopedic injury or pelvic fracture.    Medical Decision Making        Launch MDCalc MDM Tool   Click the link above to launch the MDCalc MDM tool. Save the tool results and then refresh the note (lower-left corner or Ctrl+[id number]). The MDM tool results must be imported into the note to sign it.      Clinical Impression           Injury of head, initial encounter (Primary)      Prescriptions     Discharge Medication List as of 03/02/2024  6:14 AM          Disposition and Follow-up   Disposition:  Discharge [1]     Future Appointments   Date Time Provider Department Center   03/05/2024  4:40 PM Lear Prosper., MD HEM/ONC [facility]   03/11/2024  4:00 PM Annah Barre., NP [facility] [place]/   07/06/2024  2:00 PM Berton Brock., MD [facility] [place]/   09/02/2024  1:00 PM Cecilio Coffer., MD [facility]       Follow up with:  No follow-up provider specified.    Return precautions are specified on After Visit Summary.                   Jodi Law, MD  03/03/24 [phone]

## 2024-03-03 ENCOUNTER — Other Ambulatory Visit: Payer: TRICARE (CHAMPUS)

## 2024-03-03 NOTE — Telephone Encounter
Patient schedule thank you

## 2024-03-04 ENCOUNTER — Other Ambulatory Visit: Payer: TRICARE (CHAMPUS)

## 2024-03-04 ENCOUNTER — Telehealth: Payer: TRICARE (CHAMPUS)

## 2024-03-04 MED ORDER — QUETIAPINE FUMARATE 25 MG PO TABS
12.5 mg | ORAL_TABLET | Freq: Every evening | ORAL | 3 refills | 45.00000 days | Status: AC
Start: 2024-03-04 — End: ?

## 2024-03-04 NOTE — Telephone Encounter
 PSR called and spk with Trevor Fudge from [name] and verbally conf that the order was received.

## 2024-03-04 NOTE — Telephone Encounter
 PsR faxed order to [name].

## 2024-03-04 NOTE — Telephone Encounter
 Faxed to [facility] to [phone]. Thank you

## 2024-03-04 NOTE — Telephone Encounter
 Regis Captain - Please fax this order to [name], attn Ubaldo Galt (staff nurse).  Discontinue quetiapine  25 mg po at bedtime  Start quetiapine  12.5 mg po at bedtime    Electronically signed by [name]    Thank you,  [name], DNP, GNP-BC  Dementia Care Specialist  [place]  Date: 03/04/2024

## 2024-03-04 NOTE — Telephone Encounter
 Left message asking for Pt spouse to return my call to verify if he will be present for appt tomorrow    Spoke to Pts daughter, daughter has informed both Rich Champ annd herself will be present for 03/05/24's VV

## 2024-03-04 NOTE — Telephone Encounter
 See telephone encounter from this afternoon addressing this issue.    Thank you,  [name], DNP, [facility]  Dementia Care Specialist  [place] [facility]  Date: 03/04/2024

## 2024-03-04 NOTE — Telephone Encounter
 [facility] [facility]  Telephone Call  Time Start: 1354  Time End: 1416    Date:  03/04/2024    Name: Jodi Bentley  MRN: [id number]  DOB: September 22, 1941  Sex: female  Phone Number: [phone] (home)   Mailing address: [address] [place] [address]    Caregiver/Decision maker:   Name/Relationship: Dance movement psychotherapist [phone]    Primary Care Physician: Jodi Bentley, Jodi Bentley., MD.     Reason for contact: Follow up re hospitalization    PMH:    Past Medical History:   Diagnosis Date    Histoplasmosis        Medication List:    Outpatient Encounter Medications as of 03/04/2024   Medication Sig Dispense Refill    amLODIPine  10 mg tablet Take 10 mg once daily as needed for systolic blood pressure above 188 mmHg. 30 tablet 0    bisacodyl (DULCOLAX) 5 mg EC tablet Take 1 tablet (5 mg total) by mouth daily as needed for Constipation.      busPIRone 15 mg tablet Take 1 tablet (15 mg total) by mouth three (3) times daily. 270 tablet 3    Cholecalciferol (VITAMIN D3) 10 mcg (400 units) CAPS Take by mouth daily.      lidocaine  5% patch APPLY 1 PATCH TOPICALLY DAILY (12 HOURS ON AND 12 HOURS OFF) TO LOWER BACK 30 patch 11    LORATADINE  10 mg tablet TAKE 1 TABLET DAILY 90 tablet 3    pancrelipase , Lip-Prot-Amyl, (CREON ) 36000 units DR capsule Take 1 capsule (36,000 units of lipase total) by mouth three (3) times daily with meals. 90 capsule 11    pantoprazole  40 mg DR tablet Take 1 tablet (40 mg total) by mouth daily. 90 tablet 1    QUEtiapine  25 mg tablet Take 1 tablet (25 mg total) by mouth at bedtime. 60 tablet 3    rivastigmine  4.5 mg capsule Take 1 capsule (4.5 mg total) by mouth two (2) times daily before meals. 180 capsule 2    venlafaxine  150 mg 24 hr capsule Take 2 capsules (300 mg total) by mouth daily. 180 capsule 1     Facility-Administered Encounter Medications as of 03/04/2024   Medication Dose Route Frequency Provider Last Rate Last Admin    [COMPLETED] Gallium Ga-68 dotatate (Netspot) inj 5.4 millicurie  5.4 millicurie Intravenous Once Bentley, Jodi K., MD   5.4 millicurie at 02/28/24 1148    [COMPLETED] iodixanol  (Visipaque ) 320 mg/mL inj 100 mL  100 mL Intravenous Once Bentley, Jodi K., MD   100 mL at 02/28/24 1248    [DISCONTINUED] iohexol  (Omnipaque ) 350 mg/mL inj 100 mL  100 mL Intravenous Once Bentley, Jodi K., MD            Interim:   Telephone call with dtr Jodi Bentley.  Patient's seroquel  was increased to 25 mg po at bedtime [name] psych) last week.  Fall and ER visit on 03/02/24, cleared and discharged back to [name].  Call today in response to daughter's email request re seroquel .    A/P:   Major neurocognitive disorder  S/p fall after seroquel  increased to 25 mg po at bedtime  Discussed potential pros and cons of continuing.  Recommended to decrease to prior dose of 25 mg po at bedtime, and potentially weaning off completely.  [name] staff generally quite good with behavioral modifications.  Family agreeable - Will decreased quetiapine  to 12.5 mg po qhs    Follow-up:  Next week     Jodi Hymen, DNP  Dementia Care Specialist  [place] [facility]  03/04/2024    [facility]     Green  []      Yellow  []      Red  [x]          I have spent 22 minutes to review patient?s medical records and/or care coordination related to dementia after direct patient care.     []   Dementia workup (including TSH, Vitamin B12, RPR, brain imaging)   []   Neurology, Psychiatry, Neuropsychology consultation note, progress notes, H&P, PCP notes   []  ER/hospitalization/NPI admissions/SNF stay   []  Advance Directive/POLST    [x]  Family telephone meeting    []  Consultation with other providers

## 2024-03-05 ENCOUNTER — Ambulatory Visit: Payer: TRICARE (CHAMPUS) | Attending: Gastroenterology

## 2024-03-05 DIAGNOSIS — K8689 Other specified diseases of pancreas: Secondary | ICD-10-CM

## 2024-03-05 DIAGNOSIS — D3A8 Other benign neuroendocrine tumors: Secondary | ICD-10-CM

## 2024-03-05 NOTE — Patient Instructions
Return in 6 months after PET/CT

## 2024-03-05 NOTE — Progress Notes
 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     HISTORY OF PRESENT ILLNESS:  Patient is an 83 year old female who has moved from [place]  to [place]  and is here for of followup. The patient initially presented with abdominal pain and weight loss. She was evaluated and found to have a tumor in the small bowel. She underwent resection on 11/07/2021, was found to have a grade 2 small bowel neuroendocrine tumor. Ki-67 3% to 20%; it was T4 N1, 2 out of 31 lymph nodes were positive. There was no lymphovascular, but there was perineural invasion. There was tumor at the radial or mesenteric margin. The patient has done well postoperatively. 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 She now presents for further evaluation and treatment options. She had an original CT, which showed the mesenteric mass with mesenteric tethering as far as I can tell, has not had since then.     04/22/23 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 Recommend correlation with prior imaging or MR abdomen with hepatobiliary agent (Eovist). No flushing and diarrhea. Negative FOBT.      08/08/23 PET/CT 10/10 1.  Interval increase in number, size, and intense DOTATATE uptake of innumerable ill-defined hepatic metastatic lesions; compatible with disease progression. If better visualization desired, can consider MRI abdomen with Eovist.2.  Worsening L1 vertebral body compression fracture and several new healing right rib fractures; both associated with moderate dotatate uptake, which may represent chronic/degenerative or posttraumatic changes. Correlate with trauma history and close attention on follow-up.3.  Elsewhere, no new DOTATATE avid metastatic disease.4.  Interval development of marked urinary bladder wall thickening; could represent cystitis. Correlate with urinalysis. No flushing.           10/10/23 Started lanreotide  #1 08/30/23, #2 09/29/23 in assisted living, daughter with her on the call.  Some weight loss. Occ diarrhea.  Decreased appetite.  To see [facility].     12/02/23 Since last visit has had fall and UTI and apparent decline. Lanreotide #3 11/04/23. PET/CT 2/9  1. Interval stability to increase in size with persistent intense DOTATATE uptake of the numerous ill-defined hepatic metastatic lesions. If clinically indicated size of the hepatic metastases may be better characterized with MRI.2. Stable to slight increase in small foci of dotatate expression that likely corresponds to soft tissue attenuation adjacent to the uterus with somewhat limited evaluation due to misregistration but suspicious for malignant deposits.3. Stable to moderate to severe wedge compression deformity of the L1 vertebral body with decreased radiotracer uptake and interval healing of right rib fractures as described above. 4. Interval decrease in the urinary bladder wall thickening and enhancement and decreased heterogeneous enhancement of the bilateral kidneys likely sequela of prior infection. Clinical correlation is suggested. Cr increased slightly. Weight trending down over 10 lbs since 08/2023. Continues to report both diarrhea and constipation. No flushing or vomiting. More diarrhea.     03/05/24 2/16 Fecal fat increased. Continues on monthly lanreotide 120mg , last injection 5/5. 5/9 1. Grossly stable size and intense DOTATATE uptake of the numerous ill-defined hepatic  metastatic lesions. If clinically indicated size of the hepatic metastases may be better characterized with MRI.2. Stable to slight increased intensity of small foci of DOTATATE expression that likely corresponds to soft tissue attenuation adjacent to the uterus with somewhat limited evaluation due to misregistration but suspicious for malignant deposits.3. Few new small foci of mild radiotracer uptake as described above are indeterminate and attention on follow up is recommended. 4. Stable to severe wedge compression deformity of the L1 vertebral body without significant radiotracer uptake.  5. Persistent mild circumferential urinary bladder wall thickening with trabeculation and small urinary bladder diverticula. Correlate with urinalysis for possible cystitis. Presented to ED 5/12 with hip pain, head injury after fall. XR R hip 5/12 no evidence of fracture or dislocation. CT brain 5/12 Stable ventriculomegaly, which can be related to a combination of parenchymal volume loss and chronic communicating hydrocephalus. Husband and daughter Philippe Brazen) were on as pt unable to. Diarrhea is intermittent.      Subjective:       Past Medical History:   Diagnosis Date    Histoplasmosis      No past surgical history on file.  Social History     Socioeconomic History    Marital status: Married   Tobacco Use    Smoking status: Former     Types: Cigarettes    Smokeless tobacco: Never     Social Drivers of Psychologist, prison and probation services Strain: Low Risk  (02/11/2024)    Financial Resource Strain     Difficulty of Paying Living Expenses: Not hard at all   Physical Activity: Inactive (02/11/2024)    Physical Activity     Days of Exercise per Week: 0 days     Minutes of Exercise per Session: 0 min   Stress: Stress Concern Present (02/11/2024)    Stress     Feeling of Stress : Very much     No family history on file.  Current Outpatient Medications   Medication Sig    amLODIPine  10 mg tablet Take 10 mg once daily as needed for systolic blood pressure above 213 mmHg.    bisacodyl (DULCOLAX) 5 mg EC tablet Take 1 tablet (5 mg total) by mouth daily as needed for Constipation. busPIRone 15 mg tablet Take 1 tablet (15 mg total) by mouth three (3) times daily.    Cholecalciferol (VITAMIN D3) 10 mcg (400 units) CAPS Take by mouth daily.    lidocaine  5% patch APPLY 1 PATCH TOPICALLY DAILY (12 HOURS ON AND 12 HOURS OFF) TO LOWER BACK    LORATADINE  10 mg tablet TAKE 1 TABLET DAILY    pancrelipase , Lip-Prot-Amyl, (CREON ) 36000 units DR capsule Take 1 capsule (36,000 units of lipase total) by mouth three (3) times daily with meals.    pantoprazole  40 mg DR tablet Take 1 tablet (40 mg total) by mouth daily.    QUEtiapine  25 mg tablet Take 0.5 tablets (12.5 mg total) by mouth at bedtime.    rivastigmine  4.5 mg capsule Take 1 capsule (4.5 mg total) by mouth two (2) times daily before meals.    venlafaxine  150 mg 24 hr capsule Take 2 capsules (300 mg total) by mouth daily.     No current facility-administered medications for this visit.      Allergies   Allergen Reactions    Ace Inhibitors Cough     Other reaction(s): Other (See Comments)  cough  cough  cough      Sulfasalazine     Sulfa  Antibiotics Rash and Other (See Comments)     Wt Readings from Last 3 Encounters:   03/02/24 59 kg (130 lb)   02/24/24 57.6 kg (127 lb)   02/19/24 58.5 kg (129 lb)       Review of Systems  Constitutional: negative for anorexia, fatigue and weight loss, level of energy  Eyes: negative for pain, diplopia, dryness  Ears, nose, mouth, throat, and face: negative for ear drainage, epistaxis, hearing loss, hoarseness, nasal congestion, sore mouth and sore throat  Respiratory: negative for cough, dyspnea on exertion and sputum  Cardiovascular: negative for chest pain, exertional chest pressure/discomfort and palpitations  Gastrointestinal: negative for abdominal pain, constipation, diarrhea, dysphagia, nausea, reflux symptoms and vomiting  Integument: negative for dryness, pruritus, rash, skin color change and skin lesion(s)  Hematologic/lymphatic: negative for bleeding, easy bruising, lymphadenopathy and petechiae  Musculoskeletal:negative for arthralgias, muscle weakness and myalgias  Neurological: negative for headaches, memory problems and speech problems  Behavioral/Psych: negative for anxiety and depression  Endocrine: negative for temperature intolerance  Allergic/Immunologic:   Allergies   Allergen Reactions    Ace Inhibitors Cough     Other reaction(s): Other (See Comments)  cough  cough  cough      Sulfasalazine     Sulfa Antibiotics Rash and Other (See Comments)          Objective:        Vitals:There were no vitals taken for this visit.     Wt Readings from Last 1 Encounters:   03/02/24 59 kg (130 lb)         General: Appears well-developed, well-nourished and close to stated age.      Psychiatric: Affect appropriate.  Pleasant and conversant.      ECOG:3    Lab Review:    Results for orders placed or performed in visit on 02/24/24   CBC & Plt & Differential   Result Value Ref Range    WBC (LabDAQ) 11.46 (H) 3.98 - 10.04 10??/uL    RBC ([facility]) 4.01 3.90 - 6.10 10X6/uL    Hemoglobin ([facility]DA[facility]) 12.6 11.2 - 15.7 g/dL    Hematocrit (LabDAQ) 38.5 34.1 - 44.9 %    MCV (LabDAQ) 96.0 (H) 79.0 - 94.8 fL    MCH (LabDAQ) 31.4 25.6 - 32.2 pg    MCHC (LabDAQ) 32.7 32.2 - 36.5 g/dL    RDW-CV (LabDAQ) 16.1 11.6 - 14.4 %    Platelets (LabDAQ) 173 163 - 369 10??/uL    MPV (LabDAQ) 10.3 9.4 - 12.4 fL    Neutrophil % (LabDAQ) 64.7 34.0 - 71.1 %    Lymphocyte % (LabDAQ) 22.4 19.3 - 53.1 %    Monocyte % ([facility]DAQ) 7.2 4.7 - 12.5 %    Eosinophil % ([facility]DAQ) 4.6 0.7 - 7.0 %    Basophil % (LabDAQ) 0.8 0.1 - 1.2 %    Neutrophil # (LabDAQ) 7.40 (H) 1.56 - 6.13 10??/uL    Lymphocyte # (LabDAQ) 2.57 1.18 - 3.74 10??/uL    Monocyte # (LabDAQ) 0.83 0.24 - 0.86 10??/uL    Eosinophil # (LabDAQ) 0.53 0.04 - 0.54 10??/uL    Basophil # (LabDAQ) 0.09 (H) 0.01 - 0.08 10??/uL    IG% (LabDAQ) 0.3 0.0 - 0.5 %    IG# (LabDAQ) 0.04 (H) 0.00 - 0.03 10??/uL    Narrative    NOTE: IG = Immature Granulocytes     Results for orders placed or performed in visit on 02/24/24   Comprehensive Metabolic Panel, Serum  Result Value Ref Range    Sodium 144 135 - 146 mmol/L    Potassium 4.6 3.6 - 5.3 mmol/L    Chloride 103 96 - 106 mmol/L    Total CO2 26 20 - 30 mmol/L    Anion Gap 15 8 - 19 mmol/L    Glucose 92 65 - 99 mg/dL    Creatinine 1.61 (H) 0.60 - 1.30 mg/dL    Estimated GFR 37 See GFR Additional Information mL/min/1.92m2    GFR Additional Information See Comment     Urea Nitrogen 20 7 - 22 mg/dL    Calcium 9.7 8.6 - 09.6 mg/dL    Total Protein 6.5 6.1 - 8.2 g/dL    Albumin 4.3 3.9 - 5.0 g/dL    Bilirubin,Total 0.5 0.1 - 1.2 mg/dL    Alkaline Phosphatase 94 37 - 133 U/L    Aspartate Aminotransferase 24 13 - 62 U/L    Alanine Aminotransferase 16 8 - 70 U/L     No results found for: ''CA125''  No results found for: ''CEAG'', ''CA199''         Other Tests Reviewed:  IMAGING:    PETCT with Diagnostic CT of the neck-chest-abd-pelvis w/IV contrast; Somatostatin receptor imaging (NETSPOT, DETECTNET )  Result Date: 03/02/2024   History of small bowel neuroendocrine tumor status post resection with:  1. Grossly stable size and intense DOTATATE uptake of the numerous ill-defined hepatic metastatic lesions. If clinically indicated size of the hepatic metastases may be better characterized with MRI.  2. Stable to slight increased intensity of small foci of DOTATATE expression that likely corresponds to soft tissue attenuation adjacent to the uterus with somewhat limited evaluation due to misregistration but suspicious for malignant deposits.  3. Few new small foci of mild radiotracer uptake as described above are indeterminate and attention on follow up is recommended. 4. Stable to severe wedge compression deformity of the L1 vertebral body without significant radiotracer uptake.  5. Persistent mild circumferential urinary bladder wall thickening with trabeculation and small urinary bladder diverticula. Correlate with urinalysis for possible cystitis.  Thank you for allowing [facility] to assist in the care of this patient. This study has been reviewed jointly by Radiologist Eliberto Grosser, M.D. and Nuclear Medicine Physician Tyson Gals, M.D. The above constitutes a synthesis of both the CT and PET findings and the physicians have reviewed and are in agreement with the report.     CT brain wo contrast  Result Date: 03/02/2024  Stable ventriculomegaly, which can be related to a combination of parenchymal volume loss and chronic communicating hydrocephalus. This report was electronically signed by Mozell Arias MD on 03/02/2024 5:19:28 AM.     XR hip ap+lat right w pelvis portable (3 Views)  Result Date: 03/02/2024  Impression: 1.  3 cortical screws in the right femoral neck. 2.  No evidence of a displaced fracture. This report was electronically signed by Iola Manila MD on 03/02/2024 3:49:37 AM. Signed by: Kem Patten   03/02/2024 8:01 AM       Assessment/Plan:     Metastatic SB NET:  Started somatostatin analog.  Unclear if having any symptoms from that or her neuroendocrine tumor.  SD on scans.  Long discussion regarding whether they wanted to continue on the somatostatin analog in the setting of her dementia which appears to be progressing.  At this time they wanted to continue.  Also asked whether or not we should continue imaging but we will schedule for 6 months for now.  With slightly elevated creatinine we will do without contrast.    Wt Loss: May be multifactorial.     3.  Diarrhea: Takes occ Imodium. Increased fecal fat, elastase not done now on CREON .     I spent 28 minutes reviewing chart, face to face (15 minutes video) and coordinating care    I am providing longitudinal care for this serious problem of neuroendocrine tumor..     Return in about 6 months (around 09/05/2024).        Clemetine Cypher, MD

## 2024-03-11 ENCOUNTER — Telehealth: Payer: TRICARE (CHAMPUS)

## 2024-03-11 ENCOUNTER — Other Ambulatory Visit: Payer: MEDICARE

## 2024-03-11 ENCOUNTER — Ambulatory Visit: Payer: MEDICARE

## 2024-03-11 DIAGNOSIS — R451 Restlessness and agitation: Secondary | ICD-10-CM

## 2024-03-11 NOTE — Progress Notes
 Patient Consent to Telehealth Questionnaire        No data to display              - I agree  to be treated via a video visit and acknowledge that I may be liable for any relevant copays or coinsurance depending on my insurance plan.  - I understand that this video visit is offered for my convenience and I am able to cancel and reschedule for an in-person appointment if I desire.  - I also acknowledge that sensitive medical information may be discussed during this video visit appointment and that it is my responsibility to locate myself in a location that ensures privacy to my own level of comfort.  - I also acknowledge that I should not be participating in a video visit in a way that could cause danger to myself or to those around me (such as driving or walking).  If my provider is concerned about my safety, I understand that they have the right to terminate the visit.         Jodi Bentley  Follow-Up Visit    Date: 03/11/2024    Name: Jodi Bentley    MRN: [id number]  DOB: 12/28/1940    Sex: female  Phone Number: [phone] (home)   Mailing address: [address]  [place] [address]    Caregiver/family:  Jodi Bentley     Primary Care Physician: Jodi Becker, Chelsea Cordia., MD      [phone]     [address]  [place] [address]    PMH:    Past Medical History:   Diagnosis Date    Histoplasmosis        Medication List:    Outpatient Encounter Medications as of 03/11/2024   Medication Sig Dispense Refill    amLODIPine  10 mg tablet Take 10 mg once daily as needed for systolic blood pressure above 440 mmHg. 30 tablet 0    bisacodyl (DULCOLAX) 5 mg EC tablet Take 1 tablet (5 mg total) by mouth daily as needed for Constipation.      [EXPIRED] busPIRone 15 mg tablet Take 1 tablet (15 mg total) by mouth three (3) times daily. 270 tablet 3    Cholecalciferol (VITAMIN D3) 10 mcg (400 units) CAPS Take by mouth daily.      lidocaine  5% patch APPLY 1 PATCH TOPICALLY DAILY (12 HOURS ON AND 12 HOURS OFF) TO LOWER BACK 30 patch 11    LORATADINE  10 mg tablet TAKE 1 TABLET DAILY 90 tablet 3    pancrelipase , Lip-Prot-Amyl, (CREON ) 36000 units DR capsule Take 1 capsule (36,000 units of lipase total) by mouth three (3) times daily with meals. 90 capsule 11    pantoprazole  40 mg DR tablet Take 1 tablet (40 mg total) by mouth daily. 90 tablet 1    QUEtiapine  25 mg tablet Take 0.5 tablets (12.5 mg total) by mouth at bedtime. 45 tablet 3    rivastigmine  4.5 mg capsule Take 1 capsule (4.5 mg total) by mouth two (2) times daily before meals. 180 capsule 2    venlafaxine  150 mg 24 hr capsule Take 2 capsules (300 mg total) by mouth daily. 180 capsule 1     No facility-administered encounter medications on file as of 03/11/2024.        Subjective:   Jodi Bentley is an 83 y.o. year old female here for a follow up.  Present are her  family: daughter Jodi Bentley, husband Jodi Bentley, and son Jodi Bentley on Zoom.  Patient not present as family would like to discuss her agitation and other behavioral disturbances.    Family report her agitation appears to be stable on quetiapine  12.5 mg po at bedtime  Continues to have agitation (sees children), but not as bothered by her symptoms.  Would like to discuss recommendations of minimizing her agitation, particularly symptoms towards her husband.   They often get into arguments, husband has difficulty coping with his wife's symptoms when she gets upset.  Routine of nightly visits 4.5 hours (dinner, TV, news), begins at 430 pm and ends approx 8-830 pm.    Family had questions about disease prognosis and progression.    Objective:   Vital Signs:    There were no vitals taken for this visit. There were no vitals filed for this visit. .[id number]     Wt Readings from Last 10 Encounters:   03/02/24 130 lb (59 kg)   02/24/24 127 lb (57.6 kg)   02/19/24 129 lb (58.5 kg)   12/30/23 130 lb (59 kg)   12/02/23 131 lb 3.2 oz (59.5 kg)   12/02/23 130 lb 1.1 oz (59 kg) 11/04/23 128 lb (58.1 kg)   10/07/23 133 lb (60.3 kg)   08/30/23 139 lb (63 kg)   08/13/23 138 lb 12.8 oz (63 kg)                  A/P:  Agitation  Discussed behavioral recommendations: Limit visits with wife in evening, currently 4.5 hours each day.   Discussed de-escalation techniques for husband (as well as dementia fiblets).  Encouraged family to get assistance from [name] staff.    Major neurocognitive disorder  Discussed disease prognosis and progression with family and answered questions.  Continue quetiapine  12.5 mg po every day. Low dose appears to work well for her symptoms without causing falls.    Follow-up: 2-3 mos or PRN    The evaluation took 51 minutes, more than half the time was spent on counseling on items mentioned in the care plan above.    Jodi Hymen, DNP, GNP-BC  [facility]  03/11/2024      Time start: 1600  Time end: 1651   The evaluation took 51 minutes. The above plan of care, diagnosis, orders, and follow-up were discussed with the patient and family. Questions related to this recommended plan of care were answered.    I performed the following items on the day of service:  [x]  Preparing to see the patient and caregiver (e.g., review of tests)  [x]  Obtaining and/or reviewing separately obtained history (from family)  []  Performing a medically appropriate examination and/or evaluation   [x]  Counseling and educating the patient/family/caregiver   []  Ordering medications, tests, or procedures  []  Referring and communicating with other healthcare professionals   [x]  Documenting clinical information in the EHR  [x]  Independently interpreting results and communicating results to patient/family/caregiver      I spent the following total amount of time on these tasks on the day of service:  New Patient     Established Patient  []  15-29 minutes - 99202    []  up to 9 minutes - 99211  []  30-44 minutes - 99203     []  10-19 minutes - 99212   []  45-59 minutes - 99204      []  20-29 minutes - 99213   []  60-74 minutes - 99205   []  30-39  minutes - 99214         [x]  40-55 minutes - 99215  Telemedicine Visits  [All the following must be checked off in order to bill for this service.]   [x]   This patient is an established patient in my practice.    [x]   This patient lives in [place] .   [x]   I am a licensed provider in the state of [place] .  [x]   This service is not related to a problem addressed by an E/M encounter in the prior 7 days.  [x]   The patient has been notified that this visit will result in an E/M charge.   [x]   This service will not result in an office or telemedicine visit within the next 24 hours or soonest available appointment.  [x]   This service was initiated by the patient.

## 2024-03-11 NOTE — Telephone Encounter
 Faxed to [facility] to [phone]. Thank you

## 2024-03-17 ENCOUNTER — Other Ambulatory Visit: Payer: MEDICARE

## 2024-03-18 ENCOUNTER — Telehealth: Payer: MEDICARE

## 2024-03-18 ENCOUNTER — Telehealth: Payer: TRICARE (CHAMPUS)

## 2024-03-18 NOTE — Telephone Encounter
 Spoke with daughter [name] and scheduled next 3 Lanreotide appts.  Thank you,  Karmela Bram

## 2024-03-18 NOTE — Telephone Encounter
 Spoke with the pts daughter due to mychart message received that assistance was required, Jullie Oiler also provided the [employer] phone number

## 2024-03-23 ENCOUNTER — Ambulatory Visit: Payer: TRICARE (CHAMPUS) | Attending: Gastroenterology

## 2024-03-23 ENCOUNTER — Ambulatory Visit: Payer: MEDICARE

## 2024-03-23 ENCOUNTER — Telehealth: Payer: MEDICARE

## 2024-03-23 NOTE — Telephone Encounter
 The following patient received their alignment    Patient Name: Jodi Bentley  Patient MRN: [id number]  Received notification of CMS GUIDE alignment: 03/17/2024  Tier: Moderate Complexity Dyad Tier  Code: W1191    Updated in Episode of Care

## 2024-03-25 ENCOUNTER — Telehealth: Payer: TRICARE (CHAMPUS)

## 2024-03-25 ENCOUNTER — Telehealth: Payer: MEDICARE

## 2024-03-25 NOTE — Telephone Encounter
 Faxed to [facility] to [phone]. Thank you

## 2024-03-25 NOTE — Telephone Encounter
 Faxed to [facility] to [phone]. Thank you

## 2024-04-03 ENCOUNTER — Ambulatory Visit: Payer: MEDICARE

## 2024-04-03 DIAGNOSIS — R0989 Other specified symptoms and signs involving the circulatory and respiratory systems: Secondary | ICD-10-CM

## 2024-04-03 DIAGNOSIS — R296 Repeated falls: Secondary | ICD-10-CM

## 2024-04-03 NOTE — Progress Notes
 [facility]   Progress Note    CC:   Chief Complaint   Patient presents with    Follow-up    raspy throat     On and off for a year        S: Jodi Bentley is a 83 y.o. female with malignant metastatic NET, dementia here with husband for follow up.    Back pain and right knee pain. Lidocaine  patches help.  Having trouble walking straight, using walker but forgets  Frequent falls per husband no worse in the last 2-3 years but is a continuing problem  Still in OT twice/week. PT expired.  BP is much better       Past Medical History:   Diagnosis Date    Histoplasmosis        ROS  Negative except as above    Medications that the patient states to be currently taking   Medication Sig    bisacodyl (DULCOLAX) 5 mg EC tablet Take 1 tablet (5 mg total) by mouth daily as needed for Constipation.    Cholecalciferol (VITAMIN D3) 10 mcg (400 units) CAPS Take by mouth daily.    lidocaine  5% patch APPLY 1 PATCH TOPICALLY DAILY (12 HOURS ON AND 12 HOURS OFF) TO LOWER BACK    LORATADINE  10 mg tablet TAKE 1 TABLET DAILY    pancrelipase , Lip-Prot-Amyl, (CREON ) 36000 units DR capsule Take 1 capsule (36,000 units of lipase total) by mouth three (3) times daily with meals.    QUEtiapine  25 mg tablet Take 0.5 tablets (12.5 mg total) by mouth at bedtime.    rivastigmine  4.5 mg capsule Take 1 capsule (4.5 mg total) by mouth two (2) times daily before meals.    venlafaxine  150 mg 24 hr capsule Take 2 capsules (300 mg total) by mouth daily.       O: BP 122/76  ~ Pulse 70  ~ Temp 36.4 ?C (97.6 ?F) (Tympanic)  ~ Resp 14  ~ Ht 5' 5.75'' (1.67 m)  ~ Wt 125 lb 12.8 oz (57.1 kg)  ~ SpO2 97%  ~ BMI 20.46 kg/m?   Physical Exam  Constitutional:       General: She is not in acute distress.     Appearance: She is well-developed.   HENT:      Head: Normocephalic and atraumatic.   Cardiovascular:      Rate and Rhythm: Normal rate.   Pulmonary:      Effort: Pulmonary effort is normal.   Neurological:      Mental Status: She is alert. A/P: 83 y.o. female with malignant metastatic NET, dementia here with husband for follow up.    Diagnoses and all orders for this visit:    Frequent falls  Balance problem  - already used up PT sessions, continue OT  - continue FWW  - fall precautions    Closed compression fracture of body of L1 vertebra (HCC/RAF)  - husband (DPOA) reports satisfaction with pain control, continue present management    Major neurocognitive disorder (HCC/RAF)  - continue present management per Alzheimer's and Dementia team    Other malignant neuroendocrine tumors (HCC/RAF)  - continue present management per oncology    Unspecified mood (affective) disorder (HCC/RAF)  - continue present management per psychiatry    Labile blood pressure  - improved for time being    F/u PRN    The above plan of care, diagnosis, orders, and follow-up were discussed with the patient/caregiver. All questions related to  this recommended plan of care were answered.    Autumn Boast, MD  Family Medicine

## 2024-04-07 ENCOUNTER — Other Ambulatory Visit: Payer: TRICARE (CHAMPUS)

## 2024-04-07 ENCOUNTER — Telehealth: Payer: MEDICARE

## 2024-04-07 NOTE — Telephone Encounter
 G-Code dropped for June.    Jodi Bentley  Dementia Care Assistant  On behalf of Annah Barre., NP  [facility]  [phone]

## 2024-04-07 NOTE — Telephone Encounter
 Preferred phone number: [phone]     Next Appointment:   Visit date not found     Next Appointment with PCP:   Recent Visits  Date Type Provider Dept   04/03/24 Office Visit Ardena Becker, Chelsea Cordia., MD [facility]   08/13/23 Office Visit Ardena Becker, Chelsea Cordia., MD [facility]   06/12/23 Office Visit Ardena Becker, Chelsea Cordia., MD [facility]   Showing recent visits within past 365 days and meeting all other requirements  Future Appointments  No visits were found meeting these conditions.  Showing future appointments within next 180 days and meeting all other requirements        (click to expand/collapse)    What type of message is this:   ASK:  Has your Physical Therapist recommended continued treatment?    (YES) *Please follow steps 1-3    SUBMIT referral order to provider for signature. Include the same information and diagnosis as the original referral.    CONFIRM recent treatment report is on file (scanned in patients chart).    SELECT the ''Cont Tx'' box on referral

## 2024-04-07 NOTE — Telephone Encounter
 Jodi Bentley for Jodi Barre., NP  [facility]    Date: 04/07/2024    Start Time: 11:00AM  End Time: 11:20AM  Length: 20 minutes    Patient Contact: [name],[name]    Reason for call: ADC Routine Check-in Call    [facility] placed a call to patient's daughter, [name],[name]  [facility] advised they were calling on behalf of DCS Panlilio, Yolonda Henderson., NP, DCS with [facility].  [facility] encouraged they call us  if they have any questions/concerns prior to our next check.  [facility] provided contact information [phone]    Comments:     [name] advised that patient receives OT & PT from facility. They were advised that if Medicare is funding source for GUIDE respite, they will be unable to continue OT & PT. They were informed by both facility and   [place]. DCA shared that GUIDE respite benefit should not affect any other medical care patient is receiving through Medicare. They have not used any of their GUIDE respite benefit. DCA advised they would follow up.     [name],[name] shared that [name] has not had any recent falls, ER/hospital visits, medication changes, or new behaviors since our last check in.  There are no concerns at his time. [name],[name] has declined to schedule a follow up phone call with Panlilio, Yolonda Henderson., NP at this time; there are no urgent needs. Patient is doing well.      [name],[name] reported there are NO new dementia related behavior(s)  Follow up:    Green  [x]     Yellow  []     Red  []       Jodi Bentley Dementia Care Assistant for Jodi Barre., NP  [facility]  04/07/2024

## 2024-04-08 ENCOUNTER — Telehealth: Payer: MEDICARE

## 2024-04-08 NOTE — Telephone Encounter
 Good Afternoon,     Call Back Request      Reason for call back:   [name] requesting to have med list signed and sent to office. Please assist.     Thank you    Any Symptoms:  []  Yes  [x]  No      If yes, what symptoms are you experiencing:    Duration of symptoms (how long):    Have you taken medication for symptoms (OTC or Rx):      If call was taken outside of clinic hours:    [] Patient or caller has been notified that this message was sent outside of normal clinic hours.     [] Patient or caller has been warm transferred to the physician's answering service. If applicable, patient or caller informed to please call us  back if symptoms progress.  Patient or caller has been notified of the turnaround time of 1-2 business day(s).

## 2024-04-09 ENCOUNTER — Telehealth: Payer: TRICARE (CHAMPUS)

## 2024-04-09 NOTE — Telephone Encounter
 Faxed to [facility] to [phone]. Thank you

## 2024-04-09 NOTE — Telephone Encounter
 S/w  Rexene Catching from [facility] to confirm they received the signed medication list from Dr.Karp. Rexene Catching stated they received it today. Thank you

## 2024-04-11 MED ORDER — PANTOPRAZOLE SODIUM 40 MG PO TBEC
ORAL_TABLET | 11 refills
Start: 2024-04-11 — End: ?

## 2024-04-11 MED ORDER — VENLAFAXINE HCL ER 150 MG PO CP24
ORAL_CAPSULE | 11 refills
Start: 2024-04-11 — End: ?

## 2024-04-11 MED ORDER — LORATADINE 10 MG PO TABS
ORAL_TABLET | 11 refills
Start: 2024-04-11 — End: ?

## 2024-04-11 MED ORDER — VITAMIN D3 25 MCG (1000 UT) PO TABS
ORAL_TABLET | 11 refills
Start: 2024-04-11 — End: ?

## 2024-04-11 MED ORDER — RIVASTIGMINE TARTRATE 4.5 MG PO CAPS
ORAL_CAPSULE | 11 refills
Start: 2024-04-11 — End: ?

## 2024-04-11 MED ORDER — BUSPIRONE HCL 15 MG PO TABS
ORAL_TABLET | 11 refills
Start: 2024-04-11 — End: ?

## 2024-04-14 ENCOUNTER — Telehealth: Payer: MEDICARE

## 2024-04-14 MED ORDER — RIVASTIGMINE TARTRATE 4.5 MG PO CAPS
4.5 mg | ORAL_CAPSULE | Freq: Two times a day (BID) | ORAL
Start: 2024-04-14 — End: ?

## 2024-04-14 MED ORDER — LORATADINE 10 MG PO TABS
ORAL_TABLET | ORAL | 3 refills | Status: SS
Start: 2024-04-14 — End: ?

## 2024-04-14 MED ORDER — RIVASTIGMINE TARTRATE 4.5 MG PO CAPS
ORAL_CAPSULE
Start: 2024-04-14 — End: ?

## 2024-04-14 MED ORDER — VENLAFAXINE HCL ER 150 MG PO CP24
ORAL_CAPSULE
Start: 2024-04-14 — End: ?

## 2024-04-14 MED ORDER — PANTOPRAZOLE SODIUM 40 MG PO TBEC
ORAL_TABLET | ORAL | 3 refills | 30.00000 days | Status: SS
Start: 2024-04-14 — End: ?

## 2024-04-14 MED ORDER — BUSPIRONE HCL 15 MG PO TABS
ORAL_TABLET
Start: 2024-04-14 — End: ?

## 2024-04-14 MED ORDER — VITAMIN D3 25 MCG (1000 UT) PO TABS
ORAL_TABLET | ORAL | 3 refills | 30.00000 days | Status: SS
Start: 2024-04-14 — End: ?

## 2024-04-14 NOTE — Telephone Encounter
 Faxed to [facility] to [phone]. Thank you

## 2024-04-14 NOTE — Telephone Encounter
 Faxed to [facility] to [phone]. Thank you

## 2024-04-15 ENCOUNTER — Telehealth: Payer: MEDICARE

## 2024-04-15 MED ORDER — RIVASTIGMINE TARTRATE 4.5 MG PO CAPS
4.5 mg | ORAL_CAPSULE | Freq: Two times a day (BID) | ORAL | 1 refills | Status: AC
Start: 2024-04-15 — End: ?

## 2024-04-15 NOTE — Telephone Encounter
 DCA placed a call to patient's daughter [name],[name] . DCA advised they were calling on behalf of Panlilio, Rosaline DASEN., NP with [facility]. [facility] reached out to see if there are any updates, changes, or issues they would like to discuss. However, DCA was unable to reach them; left voicemail.    I provided contact number: [phone]    PCC's & PSR's if [name],[name] calls back, please transfer to any available ADC Admin.  ADC Admin: Please schedule the next available Follow Up appointment, if needed, with DCS Panlilio, Rosaline DASEN., NP .    Comments:    DCA reached out to provide update that GUIDE respite benefit will not affect patient's medical benefits through Medicare; they are separate.  [employer]  Dementia Care Assistant  On behalf of Lemon Rosaline DASEN., NP  [facility]  [phone]

## 2024-04-15 NOTE — Telephone Encounter
 Faxed to [name] to [phone]. Thank you        Faxed forms to [facility] to [phone]. Thank you

## 2024-04-20 MED ADMIN — LANREOTIDE ACETATE 120 MG/0.5ML SC SOLN: 120 mg | SUBCUTANEOUS | @ 19:00:00 | Stop: 2024-04-20

## 2024-04-21 ENCOUNTER — Telehealth: Payer: TRICARE (CHAMPUS)

## 2024-04-21 ENCOUNTER — Other Ambulatory Visit: Payer: TRICARE (CHAMPUS)

## 2024-04-21 NOTE — Telephone Encounter
 Appointment Accommodation Request      Appointment Type: Return     Reason for sooner request advised by Dr. Lajean to schedule follow up appt to discuss kidney function levels     Date/Time Requested (If any):  as soon as possible     Last seen by MD: 04/03/2024    Any Symptoms:  []  Yes  [x]  No      If yes, what symptoms are you experiencing:   Duration of symptoms (how long):     Patient or caller was offered an appointment but declined.    Patient or caller was advised to seek emergency services if conditions are urgent or emergent.    Patient or caller has been notified of the turnaround time of 1-2 business (days).

## 2024-04-22 NOTE — Telephone Encounter
 Scheduled. Thank you.

## 2024-04-23 ENCOUNTER — Ambulatory Visit: Payer: TRICARE (CHAMPUS)

## 2024-04-23 ENCOUNTER — Ambulatory Visit: Payer: MEDICARE

## 2024-04-23 ENCOUNTER — Inpatient Hospital Stay
Admit: 2024-04-23 | Discharge: 2024-04-29 | Disposition: A | Discharge status: Hospice | Payer: TRICARE (CHAMPUS) | Source: Home / Self Care

## 2024-04-23 ENCOUNTER — Other Ambulatory Visit: Payer: MEDICARE

## 2024-04-23 DIAGNOSIS — N390 Urinary tract infection, site not specified: Secondary | ICD-10-CM

## 2024-04-23 DIAGNOSIS — E871 Hypo-osmolality and hyponatremia: Principal | ICD-10-CM

## 2024-04-23 DIAGNOSIS — N3000 Acute cystitis without hematuria: Secondary | ICD-10-CM

## 2024-04-23 DIAGNOSIS — N179 Acute kidney failure, unspecified: Secondary | ICD-10-CM

## 2024-04-23 LAB — Extra Light Blue Top

## 2024-04-23 LAB — HS Troponin I (Single): HIGH SENSITIVITY TROPONIN I: 9 ng/L (ref 3–<54)

## 2024-04-23 LAB — Extra Lavender Top

## 2024-04-23 LAB — UA,Dipstick: SPECIFIC GRAVITY: 1.026 (ref 1.005–1.030)

## 2024-04-23 LAB — Extra Gold Top

## 2024-04-23 LAB — UA,Microscopic: WBCS HPF: >200 {cells}/[HPF] — ABNORMAL HIGH (ref 0–4)

## 2024-04-23 LAB — Basic Metabolic Panel: ANION GAP: 8 mmol/L (ref 8–19)

## 2024-04-23 LAB — CBC: MEAN PLATELET VOLUME: 11.2 fL (ref 9.3–13.0)

## 2024-04-23 MED ADMIN — SODIUM CHLORIDE 0.9 % IV BOLUS: 1000 mL | INTRAVENOUS | @ 22:00:00 | Stop: 2024-04-23 | NDC 00338004904

## 2024-04-23 MED ADMIN — IOHEXOL 350 MG/ML IV SOLN: 75 mL | INTRAVENOUS | @ 23:00:00 | Stop: 2024-04-23 | NDC 00407141491

## 2024-04-23 NOTE — H&P
 [facility]  HISTORY AND PHYSICAL    PATIENT:  Jodi Bentley   MRN:  [id number]  DOB:  May 05, 1941  DATE OF SERVICE:  04/23/2024    REFERRING PRACTITIONER: No ref. provider found  PRIMARY CARE PROVIDER: Lajean Colorado, Chiquita HERO., MD        Chief Complaint   Patient presents with    Generalized Weakness     [facility]iba from assisted living facility, per ems family wanted patient to be seen due to leaning to the right and having general weakness today.  1100 last known well.         SUBJECTIVE:    HPI:  The patient is an 83 year old female with past medical history of dementia, HTN, malignant metastatic NET who presents to the emergency department due to noted right-sided leaning earlier today.  Information obtained via husband at bedside.  Husband and patient both reside in the [facility]; the patient resides in the memory care sector. At baseline the patient is able to ambulate with assistance of walker however is largely noncompliant with noted recurrent falls over last 2-3 years.  Earlier today an employee at the facility noted the patient was sitting in her wheelchair, leaning to the right side, which was concerning to them for possible stroke.  The patient reports burning with urination and chills for last 2 days, generalized weakness and poor oral intake. No reported slurred speech, visual changes, weakness, numbness.  The patient was subsequently brought into ED for further evaluation.    Vitals:  Blood pressure 147/94, heart rate 85, afebrile, respiratory rate 12, saturating 98% on room air     Lab significant for WBC 15.4, sodium 127, BUN 28, creatinine 2, glucose 123.  Urinalysis with bacteria, 27 squamous cells, pyuria, 3+ leukocyte esterase, negative nitrite.    -Chest x-ray with No pulmonary edema, pneumonia, pleural effusion or pneumothorax. Calcified granuloma right lateral lung base.  -CT head, CT angio brain and neck unremarkable    The patient received 1 L NS bolus, ceftriaxone  1 g    We will be admitted for IV antibiotic treatment of UTI      Past Medical History:   Diagnosis Date    Anxiety     Dementia (HCC/RAF)     Depression     Histoplasmosis        No past surgical history on file.    No family history on file.    Social History     Tobacco Use    Smoking status: Former     Types: Cigarettes     Passive exposure: Past    Smokeless tobacco: Never     Social Drivers of Health     Physical Activity: Inactive (02/11/2024)    Physical Activity     Days of Exercise per Week: 0 days     Minutes of Exercise per Session: 0 min   Stress: Stress Concern Present (02/11/2024)    Stress     Feeling of Stress : Very much   Financial Resource Strain: Low Risk  (02/11/2024)    Financial Resource Strain     Difficulty of Paying Living Expenses: Not hard at all        Prior to Admission medications    Medication Sig Start Date End Date Taking? Authorizing Provider   amLODIPine  10 mg tablet Take 10 mg once daily as needed for systolic blood pressure above 839 mmHg.  Patient not taking: Reported on 04/03/2024 10/28/23   Lajean  Ulysses Chiquita HERO., MD   bisacodyl (DULCOLAX) 5 mg EC tablet Take 1 tablet (5 mg total) by mouth daily as needed for Constipation. 10/10/22   PROVIDER, HISTORICAL   busPIRone  15 mg tablet Take 1 tablet (15 mg total) by mouth three (3) times daily. 04/15/24   Hui, Christina M., MD   Cholecalciferol (VITAMIN D3) 10 mcg (400 units) CAPS Take by mouth daily.    PROVIDER, HISTORICAL   lidocaine  5% patch APPLY 1 PATCH TOPICALLY DAILY (12 HOURS ON AND 12 HOURS OFF) TO LOWER BACK 09/11/23   Lajean Ulysses, Chiquita HERO., MD   loratadine  10 mg tablet TAKE 1 TABLET BY MOUTH DAILY DX: ALLERGIES 04/14/24   Lajean Ulysses, Chiquita HERO., MD   pancrelipase , Lip-Prot-Amyl, (CREON ) 36000 units DR capsule Take 1 capsule (36,000 units of lipase total) by mouth three (3) times daily with meals. 12/10/23   Luke Veatrice DEL., NP   pantoprazole  40 mg DR tablet TAKE 1 TABLET BY MOUTH DAILY GIVE AT 6:30AM BEFORE BREAKFAST (DO NOT CRUSH) FOR DX: GERD 04/14/24   Lajean Ulysses, Chiquita HERO., MD   QUEtiapine  25 mg tablet Take 0.5 tablets (12.5 mg total) by mouth at bedtime. 03/04/24 03/04/25  Panlilio, Rosaline T., NP   rivastigmine  4.5 mg capsule Take 1 capsule (4.5 mg total) by mouth two (2) times daily before meals. 04/15/24   Jacquelynne Juliene PARAS., MD   venlafaxine  150 mg 24 hr capsule Take 2 capsules (300 mg total) by mouth daily. 04/15/24   Alberteen Tawni HERO., MD   vitamin D, cholecalciferol, 25 mcg (1000 units) tablet TAKE 1 TABLET BY MOUTH DAILY FOR DX: SUPPLEMENT 04/14/24   Lajean Ulysses, Chiquita HERO., MD       Allergies   Allergen Reactions    Ace Inhibitors Cough     Other reaction(s): Other (See Comments)  cough  cough  cough      Sulfasalazine     Sulfa Antibiotics Rash and Other (See Comments)         A Review of Systems (ROS) was performed, and all systems were negative except for those mentioned in the above documentation.       OBJECTIVE:    BP (!) 194/88  ~ Pulse 90  ~ Resp 20  ~ SpO2 97%     PHYSICAL EXAM:     General: Laying in bed in no apparent distress.   HEENT: Normocephalic atraumatic; extraocular Muscle intact  NECK: Supple with no JVD.   CHEST: Good symmetrical movement.  CVS: RRR; no murmurs  Lungs:Clear to auscultation b/l; no wheezing, rhonchi, or rales  GU: rectal exam deferred.  Abdomen: Soft, nontender, nondistended; positive bowel sounds  Skin: No apparent erythema or breakdown  Extremities: No edema: No calf swelling noted; no cyanosis; + pulses  Neuro: Alert and not oriented: Good motor strength; intact sensation.          LABS:  Recent Results (from the past 24 hours)   CBC    Collection Time: 04/23/24  2:19 PM   Result Value Ref Range    White Blood Cell Count 15.41 (H) 4.16 - 9.95 x10E3/uL    Red Blood Cell Count 3.97 3.96 - 5.09 x10E6/uL    Hemoglobin 12.6 11.6 - 15.2 g/dL    Hematocrit 63.0 65.0 - 45.2 %    Mean Corpuscular Volume 92.9 79.3 - 98.6 fL    Mean Corpuscular Hemoglobin 31.7 26.4 - 33.4 pg    MCH Concentration 34.1 31.5 - 35.5 g/dL  Red Cell Distribution Width-SD 49.1 (H) 36.9 - 48.3 fL    Red Cell Distribution Width-CV 14.4 11.1 - 15.5 %    Platelet Count, Auto 153 143 - 398 x10E3/uL    Mean Platelet Volume 11.2 9.3 - 13.0 fL    Nucleated RBC%, automated 0.0 No Ref. Range %    Absolute Nucleated RBC Count 0.00 0.00 - 0.00 x10E3/uL   Basic Metabolic Panel    Collection Time: 04/23/24  2:19 PM   Result Value Ref Range    Sodium 127 (L) 136 - 145 mmol/L    Potassium 3.5 3.5 - 5.1 mmol/L    Chloride 94 (L) 98 - 107 mmol/L    Total CO2 25 21 - 32 mmol/L    Anion Gap 8 8 - 19 mmol/L    Glucose 123 (H) 74 - 106 mg/dL    Creatinine 7.99 (H) 0.55 - 1.30 mg/dL    Estimated GFR 24 See GFR Additional Information mL/min/1.45m2    GFR Additional Information See Comment     Urea Nitrogen 28 (H) 7 - 18 mg/dL    Calcium  8.4 (L) 8.5 - 10.1 mg/dL   HS Troponin (no reflex if abnormal)    Collection Time: 04/23/24  2:19 PM   Result Value Ref Range    High Sensitivity Troponin I 9 3 - <54 ng/L   Extra Light Blue Top    Collection Time: 04/23/24  2:19 PM   Result Value Ref Range    Extra Tube Performed    Extra Lavender Top    Collection Time: 04/23/24  2:19 PM   Result Value Ref Range    Extra Tube Performed    Extra Gold Top    Collection Time: 04/23/24  2:19 PM   Result Value Ref Range    Extra Tube Performed    ECG 12 lead    Collection Time: 04/23/24  2:33 PM   Result Value Ref Range    Ventricular Rate 86 BPM    Atrial Rate 86 BPM    P-R Interval 144 ms    QRS Duration 88 ms    Q-T Interval 358 ms    QTC Calculation (Bezet) 428 ms    P Axis 71 degrees    R Axis 27 degrees    T Axis 70 degrees    Diagnosis Sinus rhythm with Premature atrial complexes     Diagnosis Possible Left atrial enlargement     Diagnosis Borderline ECG     Diagnosis      Diagnosis       Confirmed by Elias, Zouheir [phone]) on 04/23/2024 4:22:58 PM   UA,Dipstick    Collection Time: 04/23/24  4:10 PM    Specimen: Clean Catch, Midstream; Urine   Result Value Ref Range Urine Color Light-Stewart (A)      Specific Gravity 1.026 1.005 - 1.030    pH,Urine 6.0 5.0 - 8.0    Blood 2+ (A) Negative    Bilirubin Negative Negative    Ketones Negative Negative    Glucose Negative Negative    Protein 1+ (A) Negative    Leukocyte Esterase 3+ (A) Negative    Nitrite Negative Negative   UA,Microscopic    Collection Time: 04/23/24  4:10 PM    Specimen: Clean Catch, Midstream; Urine   Result Value Ref Range    RBC per uL 354 (H) 0 - 11 cells/uL    WBC per uL >1,000 (H) 0 - 22 cells/uL    RBC per  HPF 64 (H) 0 - 2 cells/HPF    WBC per HPF >200 (H) 0 - 4 cells/HPF    Bacteria Present (A) Absent    Squamous Epi Cells 27 (H) 0 - 17 cells/uL       RADIOLOGY:  CT neck angiogram w contrast   Final Result by Niki Benton NOVAK., MD, PhD (07/03 1641)   IMPRESSION:      Unremarkable CT angiogram of the brain and neck. .            Signed by: Benton Niki   04/23/2024 4:41 PM      CT brain angiogram w contrast   Final Result by Niki Benton NOVAK., MD, PhD (07/03 1641)   IMPRESSION:      Unremarkable CT angiogram of the brain and neck. .            Signed by: Benton Niki   04/23/2024 4:41 PM      CT brain wo contrast   Final Result by Niki Benton NOVAK., MD, PhD (07/03 1637)   IMPRESSION:      No evidence of acute intracranial hemorrhage or mass effect.            Signed by: Benton Niki   04/23/2024 4:37 PM      XR chest ap portable (1 view)   Final Result by Debrah Charlie DASEN., MD (07/03 1520)   IMPRESSION:   No pulmonary edema, pneumonia, pleural effusion or pneumothorax. Calcified granuloma right lateral lung base.   Heart size is within normal limits.   No pleural effusions. No pneumothorax.   Osteopenia and maturational changes in the spine. Left axillary surgical clips.            Signed by: Charlie Debrah   04/23/2024 3:20 PM           ADMINISTERED MEDICATIONS:  No current facility-administered medications for this encounter.           ASSESSMENT & PLAN:    #Urinary tract infection  -Afebrile, WBC 15.4.  Urinalysis suggestive of infection.  -Continue ceftriaxone  1 g daily.    -Follow up urine culture and sensitivities    #Hypertensive urgency  #Labile BP  -SBP as high as 194/88 on admission  -Continue home medication amlodipine  10 mg, p.r.n. hydralazine /clonidine to maintain blood pressure <140/90    #Recurrent falls  #Ataxia  -Previous records indicate symptoms have been present for last 2-3 years; may be worsened by active UTI  -CT head, CTA head and neck were largely unremarkable, no focal neurological deficits, low suspicion for CVA.  -Consider MRI brain, neuro consult if no significant improvement.    -Fall precautions, PT/OT consult    #AKI vs CKD  -Creatinine 2, trending upwards since May of this year  -Continue fluids, follow up renal/bladder US , avoid nephrotoxic agents    #Hyponatremia  -sodium 127, continue IV fluids, trend BMP    #Dementia  #Mood disorder  -Continue quetiapine  12.5 mg bedtime, venlafaxine  300 mg daily, buspirone  15 mg TID, rivastigmine  4.5mg  BID    #Malignant metastatic NET   -Outpatient F/U with oncology    #DVT ppx  -SCDs    #Code status  -Full Code        Electronically Signed By:    GEANNIE Pouch, M.D.  [facility]

## 2024-04-23 NOTE — ED Provider Notes
 [facility]  Emergency Department Service Report    Jodi Bentley 83 y.o. female , presents with Generalized Weakness    Triage   Arrived on 04/23/2024 at 2:01 PM   Arrived by [facility] [[id number]]    ED Triage Vitals   Temp Temp src BP Heart Rate Resp SpO2 O2 Device Pain Score Weight   -- -- 04/23/24 1500 04/23/24 1500 04/23/24 1500 04/23/24 1500 04/23/24 1526 04/23/24 1635 04/23/24 2059     147/94 85 12 98 % None (Room air) Zero 59.3 kg (130 lb 11.7 oz)       Pre hospital care:       Allergies   Allergen Reactions    Ace Inhibitors Cough     Other reaction(s): Other (See Comments)  cough  cough  cough      Sulfasalazine     Sulfa Antibiotics Rash and Other (See Comments)         Initial Physician Contact       Initial Contact Completed?: Yes (04/23/24 1408)      History   HPI     83yF with history of dementia presenting with leaning to the right side, frequent falls this past week. Normally ambulates on her own but family noticed leaning to the right sided at 11am. No other deficits. When EMS arrived, you were able to correct a lean and the patient had no truncal instability.  Does not have any weakness, speech changes, vision changes.    No blood thinners..    Language Assistance                         Past Medical History:   Diagnosis Date    Anxiety     Cancer (HCC/RAF)     Dementia (HCC/RAF)     Depression     Histoplasmosis         Past Surgical History:   Procedure Laterality Date    ABDOMINAL SURGERY      BREAST SURGERY          Past Family History   Family history reviewed by me and there is no pertinent past family history related to the patient's current case and/or care.             Past Social History   she reports that she has quit smoking. Her smoking use included cigarettes. She has been exposed to tobacco smoke. She has never used smokeless tobacco. She reports that she does not currently use alcohol. She reports that she is not currently sexually active. She reports that she does not use drugs.       Physical Exam   Physical Exam  Vitals and nursing note reviewed.   Constitutional:       General: She is not in acute distress.     Appearance: Normal appearance.   HENT:      Head: Normocephalic and atraumatic.   Eyes:      Conjunctiva/sclera: Conjunctivae normal.   Cardiovascular:      Rate and Rhythm: Normal rate and regular rhythm.   Pulmonary:      Effort: Pulmonary effort is normal. No respiratory distress.   Musculoskeletal:         General: Normal range of motion.   Neurological:      General: No focal deficit present.      Mental Status: She is alert. Mental status is at baseline.  Cranial Nerves: No cranial nerve deficit.      Sensory: No sensory deficit.      Motor: No weakness.   Psychiatric:         Mood and Affect: Mood normal.         Behavior: Behavior normal.         Thought Content: Thought content normal.         ED Course     ED Course as of 04/23/24 2110   Thu Apr 23, 2024   1433 White Blood Cell Count(!): 15.41  evaluation for UTI [NG]   1444 EKG with PACs, No STEMI. [NG]   1500 Sodium(!): 127  likely hypovolemia hyponatremia [NG]   1529 CHEST X-RAY without lobar consolidation or pneumothorax per my independent interpretation.   [NG]      ED Course User Index  [NG] Lucienne Betters, MD       Laboratory Results     Labs Reviewed   CBC - Abnormal; Notable for the following components:       Result Value    White Blood Cell Count 15.41 (*)     Red Cell Distribution Width-SD 49.1 (*)     All other components within normal limits   BASIC METABOLIC PANEL - Abnormal; Notable for the following components:    Sodium 127 (*)     Chloride 94 (*)     Glucose 123 (*)     Creatinine 2.00 (*)     Urea Nitrogen 28 (*)     Calcium 8.4 (*)     All other components within normal limits   UA,DIPSTICK - Abnormal; Notable for the following components:    Urine Color [place] (*)     Blood 2+ (*)     Protein 1+ (*)     Leukocyte Esterase 3+ (*)     All other components within normal limits UA,MICROSCOPIC - Abnormal; Notable for the following components:    RBC per uL 354 (*)     WBC per uL >1,000 (*)     RBC per HPF 64 (*)     WBC per HPF >200 (*)     Bacteria Present (*)     Squamous Epi Cells 27 (*)     All other components within normal limits   HS TROPONIN I (SINGLE) - Normal   BACTERIAL CULTURE URINE   RAINBOW DRAW TO LABORATORY    Narrative:     The following orders were created for panel order Jodi Bentley (ED Adult Blood Draw: Nurse Protocol).  Procedure                               Abnormality         Status                     ---------                               -----------         ------                     Jodi Jodi Carbon Une[[id number]]                             Final result  Extra Lavender [id number]                               Final result               Extra Gold [id number]                                   Final result                 Please view results for these tests on the individual orders.   EXTRA LIGHT BLUE TOP   EXTRA LAVENDER TOP   EXTRA GOLD TOP   URINALYSIS W/REFLEX TO CULTURE    Narrative:     The following orders were created for panel order Urinalysis w/Reflex to Culture.  Procedure                               Abnormality         Status                     ---------                               -----------         ------                     UA,Dipstick[[id number]]                  Abnormal            Final result               UA,Microscopic[[id number]]               Abnormal            Final result                 Please view results for these tests on the individual orders.       Imaging Results     CT neck angiogram w contrast   Final Result by Niki Benton NOVAK., MD, PhD (07/03 1641)   IMPRESSION:      Unremarkable CT angiogram of the brain and neck. .            Signed by: Benton Niki   04/23/2024 4:41 PM      CT brain angiogram w contrast   Final Result by Niki Benton NOVAK., MD, PhD (07/03 1641)   IMPRESSION:      Unremarkable CT angiogram of the brain and neck. .            Signed by: Benton Niki   04/23/2024 4:41 PM      CT brain wo contrast   Final Result by Niki Benton NOVAK., MD, PhD (07/03 1637)   IMPRESSION:      No evidence of acute intracranial hemorrhage or mass effect.            Signed by: Benton Niki   04/23/2024 4:37 PM      XR chest ap portable (1 view)   Final Result by Debrah Charlie DASEN., MD (07/03 1520)   IMPRESSION:   No pulmonary edema, pneumonia, pleural effusion or pneumothorax. Calcified granuloma  right lateral lung base.   Heart size is within normal limits.   No pleural effusions. No pneumothorax.   Osteopenia and maturational changes in the spine. Left axillary surgical clips.            Signed by: Charlie Aho   04/23/2024 3:20 PM          Administered Medications     Medication Administration from 04/23/2024 1402 to 04/23/2024 1837         Date/Time Order Dose Route Action Action by Comments     04/23/2024 1634 PDT sodium chloride  0.9% IV soln bolus 1,000 mL 0 mL Intravenous Stopped Francesco Levee, RN --     04/23/2024 1523 PDT sodium chloride  0.9% IV soln bolus 1,000 mL 1,000 mL Intravenous New Bag/ Syringe/ Cartridge Francesco Levee, RN --     04/23/2024 1549 PDT iohexol  (Omnipaque ) 350 mg/mL inj 75 mL 75 mL Intravenous Given Lethea Sharper Dani --     04/23/2024 1747 PDT cefTRIAXone 1 g in sterile water PF 10 mL IV injection 1 g IV Push Given Forouzanfar, Atefeh, RN --                 iohexol  (Omnipaque ) 350 mg/mL inj 75 mL is a High-risk medication administered in this ED visit due to the patient's comorbid kidney disease/injury and/or known allergy/intolerance that require close physiologic and lab monitoring for toxicity.     Procedures     Procedures     Medical Decision Making   Shontell Prosser is a 83 y.o. female presenting with leaning to the right side since 11am. Frequent falls.    Medical Decision Making  Patient presenting with gait instability, considering stroke though no focal neurologic deficits on exam.  Will defer stroke code at this time and given minimal symptoms, patient isn't a tPA candidate.  Patient's symptoms are inconsistent with a large vessel occlusion.  Will obtain a CT angiogram to evaluate.  We will obtain metabolic workup to evaluate.  Anticipate admission for frequent falls.    Amount and/or Complexity of Data Reviewed  Labs: ordered. Decision-making details documented in ED Course.  Radiology: ordered.  ECG/medicine tests: ordered.    Risk  Prescription drug management.  Decision regarding hospitalization.          Launch MDCalc MDM Tool   MDCalc MDM Module  Apr 23 2024 9:10 PM [Leiliana Foody]  Data:  - Discussed with external professional: Case discussed with Admitting Provider or a provider/trainee on their team. See MDM section and/or ED Course for additional details on the discussion.  - Independent interpretation: I independently reviewed the CT Head WO contr, XR port Chest AP 1 view, ECG 12 lead. It showed no acute abnormality. [Usman Millett]  - Test/documents/historian: 3+ tests ordered  Problems: AKI (acute kidney injury)  Additional encounter diagnoses: Hyponatremia, UTI (urinary tract infection), Acute cystitis without hematuria  Risk: CTA Head Arteries W contr IV + 1 more (Iodinated IV contrast in patient w/ elevated risk), Admitted (Decision regarding hospitalization)                    Clinical Impression           Hyponatremia  AKI (acute kidney injury)  UTI (urinary tract infection) (Primary)      Prescriptions     Current Discharge Medication List          Disposition and Follow-up   Disposition:  Admit [3]     Future Appointments  Date Time Provider Department Center   04/29/2024  3:45 PM Lajean Colorado, Chiquita HERO., MD CPN [facility] [place]   05/18/2024 11:30 AM Dorris, Curlee HERO., MD HEMONCENC [facility]   07/06/2024  2:00 PM Jacquelynne Juliene PARAS., MD NEUALZTO [place]/   09/02/2024  1:00 PM Reeta Deitra CROME., MD [facility] Mon   09/04/2024 12:00 PM EIC PCT01 PET EIC [facility] Follow up with:  No follow-up provider specified.    Return precautions are specified on After Visit Summary.                   Lucienne Betters, MD  04/23/24 2111

## 2024-04-24 DIAGNOSIS — N390 Urinary tract infection, site not specified: Secondary | ICD-10-CM

## 2024-04-24 LAB — Basic Metabolic Panel: CALCIUM: 8.2 mg/dL — ABNORMAL LOW (ref 8.5–10.1)

## 2024-04-24 LAB — Phosphorus: PHOSPHORUS: 3 mg/dL (ref 2.5–4.9)

## 2024-04-24 LAB — Differential Automated: LYMPHOCYTE PERCENT, AUTO: 5.1 % (ref 1.30–3.40)

## 2024-04-24 LAB — Magnesium: MAGNESIUM: 1.8 meq/L (ref 1.5–2.0)

## 2024-04-24 LAB — Comprehensive Metabolic Panel: CREATININE: 1.7 mg/dL — ABNORMAL HIGH (ref 0.55–1.30)

## 2024-04-24 LAB — CBC
PLATELET COUNT, AUTO: 138 x10E3/uL — ABNORMAL LOW (ref 143–398)
RED CELL DISTRIBUTION WIDTH-CV: 14.5 % (ref 11.1–15.5)

## 2024-04-24 MED ADMIN — LORATADINE 10 MG PO TABS: 10 mg | ORAL | @ 02:00:00 | Stop: 2024-04-30 | NDC 68001043816

## 2024-04-24 MED ADMIN — LIDOCAINE 5 % EX PTCH: 1 | TRANSDERMAL | @ 02:00:00 | Stop: 2024-04-30 | NDC 82347050505

## 2024-04-24 MED ADMIN — AMLODIPINE BESYLATE 10 MG PO TABS: 10 mg | ORAL | @ 02:00:00 | Stop: 2024-04-30 | NDC 60687049611

## 2024-04-24 MED ADMIN — PANTOPRAZOLE SODIUM 40 MG PO TBEC: 40 mg | ORAL | @ 02:00:00 | Stop: 2024-04-30 | NDC 60687073609

## 2024-04-24 MED ADMIN — AMLODIPINE BESYLATE 10 MG PO TABS: 10 mg | ORAL | @ 16:00:00 | Stop: 2024-04-30 | NDC 60687049611

## 2024-04-24 MED ADMIN — LORATADINE 10 MG PO TABS: 10 mg | ORAL | @ 16:00:00 | Stop: 2024-04-30 | NDC 58602070283

## 2024-04-24 MED ADMIN — POTASSIUM CHLORIDE CRYS ER 20 MEQ PO TBCR: 40 meq | ORAL | @ 16:00:00 | Stop: 2024-04-30 | NDC 00245531989

## 2024-04-24 MED ADMIN — ACETAMINOPHEN 325 MG PO TABS: 650 mg | ORAL | @ 05:00:00 | Stop: 2024-04-30 | NDC 50580045811

## 2024-04-24 MED ADMIN — SODIUM CHLORIDE 0.9 % IV SOLN: 75 mL/h | INTRAVENOUS | @ 05:00:00 | Stop: 2024-04-26 | NDC 00338004904

## 2024-04-24 MED ADMIN — BUSPIRONE HCL 10 MG PO TABS: 15 mg | ORAL | @ 13:00:00 | Stop: 2024-04-30 | NDC 00904712161

## 2024-04-24 MED ADMIN — RIVASTIGMINE TARTRATE 1.5 MG PO CAPS: 4.5 mg | ORAL | @ 13:00:00 | Stop: 2024-04-30 | NDC 00904710761

## 2024-04-24 MED ADMIN — PANTOPRAZOLE SODIUM 40 MG PO TBEC: 40 mg | ORAL | @ 16:00:00 | Stop: 2024-04-30 | NDC 60687073609

## 2024-04-24 MED ADMIN — SODIUM CHLORIDE 0.9 % IV SOLN: 75 mL/h | INTRAVENOUS | @ 02:00:00 | Stop: 2024-04-26 | NDC 00338004904

## 2024-04-24 MED ADMIN — QUETIAPINE FUMARATE 25 MG PO TABS: 12.5 mg | ORAL | @ 05:00:00 | Stop: 2024-04-30 | NDC 60687032711

## 2024-04-24 MED ADMIN — HYDRALAZINE HCL 20 MG/ML IJ SOLN: 2.5 mg | INTRAVENOUS | @ 20:00:00 | Stop: 2024-04-30 | NDC 63323061401

## 2024-04-24 MED ADMIN — SODIUM CHLORIDE 0.9 % IV SOLN: 75 mL/h | INTRAVENOUS | @ 20:00:00 | Stop: 2024-04-26 | NDC 00338004904

## 2024-04-24 MED ADMIN — VENLAFAXINE HCL ER 75 MG PO CP24: 300 mg | ORAL | @ 19:00:00 | Stop: 2024-04-30 | NDC 68084070911

## 2024-04-24 MED ADMIN — LIDOCAINE 5 % EX PTCH: 1 | TRANSDERMAL | @ 13:00:00 | Stop: 2024-04-30

## 2024-04-24 MED ADMIN — CEFTRIAXONE 1 G IN SWFI IVP: 1 g | INTRAVENOUS | @ 01:00:00 | Stop: 2024-04-24 | NDC 00409733211

## 2024-04-24 MED ADMIN — BUSPIRONE HCL 10 MG PO TABS: 15 mg | ORAL | @ 05:00:00 | Stop: 2024-04-30 | NDC 00904712161

## 2024-04-24 MED ADMIN — VENLAFAXINE HCL ER 75 MG PO CP24: 300 mg | ORAL | @ 03:00:00 | Stop: 2024-04-30 | NDC 68084070911

## 2024-04-24 MED ADMIN — BUSPIRONE HCL 10 MG PO TABS: 15 mg | ORAL | @ 19:00:00 | Stop: 2024-04-30 | NDC 00904712161

## 2024-04-24 NOTE — Consults
 Physical Therapy Evaluation       PATIENT: Jodi Bentley  MRN: [id number]  DOB: 02/16/1941     ADMIT DATE: 04/23/2024    Date of Evaluation: 04/24/2024    Patient Active Problem List   Diagnosis    History of fall    Other malignant neuroendocrine tumors (HCC/RAF)    Balance problem    Impaired mobility    Severe hearing loss    Unspecified mood (affective) disorder (HCC/RAF)    GAD (generalized anxiety disorder)    Mild major depression    OSA (obstructive sleep apnea)    Dysautonomia (HCC/RAF)    Dizziness    Abnormal brain MRI    Osteoporosis without current pathological fracture    [facility]    Closed compression fracture of body of L1 vertebra (HCC/RAF)    Urinary tract infection due to extended-spectrum beta lactamase (ESBL) producing Escherichia coli     Past Surgical History:   Procedure Laterality Date    ABDOMINAL SURGERY      BREAST SURGERY         Relevant Hospital Course: came from Assisted Living Facility. Family wanted patient to be in the hospital due to generalized weakness.      Patient Stated Goal: no goals when asked. Daughter [name] present in the room. She would like her mother to be back to moving and return to Assisted Living Facility.        Living Arrangements   Type of Home: Assisted Living Facility    Home Equipment: wheelchair, front wheel walker       Prior Level of Function   Level of Independence: needs assistance with some activities of daily living, daughter states short distance ambulation (less than 50 feet)  Lives With: Assisted Living Facility    Activities of Daily Living Assistance: with some, needs assistance   Vision: normal   Hearing: hard of hearing    Transportation: not driving      Precautions   Precautions: fall risk   Orthotic: none   Current Activity Order: Activity as tolerated  Weight Bearing Status: full weight bearing       GENERAL EVALUATION   Position: In bed  Lines/devices Drains: HILV   Bladder / Bowel Devices: external catheter      Bed Mobility    Rolling in bed: minimal assist     Functional Mobility   Ambulation: not tested       Balance - not tested      Upper Extremity Assessment   Right Upper Extremity Assessment: within functional limits   Left Upper Extremity Assessment: within functional limits       Lower Extremity Assessment   Right Lower Extremity Assessment: within functional limits      Left Lower Extremity Assessment: within functional limits       Sensation   Sensation: Grossly intact     Cognition   Cognition: Within Defined Limits  Safety Awareness: poor awareness of safety precautions  Barriers to Learning: cognitive limitations, hard of hearing      Neurological Evaluation    Neuro Deficits: No     Pain Assessment   Patient complains of pain: yes   Pain Quality: no answer when asked   Pain Scale Used: Numeric Pain Scale  Pain Intensity: no answer when asked  Pain Location: stomach   Action Taken: Nursing notified    Patient Status   Activity Tolerance: fair   Oxygen Needs: no oxygen treatment  in the room   Response to Treatment: fair   Call light in reach: Yes  Presentation post treatment: In bed with daughter in the room    Interdisciplinary Communication   Interdisciplinary Communication: Nurse     ASSESSMENT   No out of bed treatment performed today. Returned to patient's room at 12:05PM, nursing staff deferred treatment due to patient having a fever and has an order for blood work. Patient appears to have decrease affect and with decrease difficult with following instruction such as moving arms and legs.   Rehab Potential: fair to good  Inpatient Recommendation: PT treatment  Problem List: Decreased strength;Decreased gait;Decreased activity tolerance;Pain limiting function; Decrease mobility  Present During Evaluation: Limitation of activities due to disability (Z73.6)  Treatment Plan: Bed mobility training;Therapeutic exercise;Balance training;Patient and/or family education;Home program;Range of motion;Safety training;Gait training;Coordinate with nurse to pre-medicate patient;Stair training;Discharge planning;Patient and/or family education  Frequency: 3-5 x/week  Duration (days): 7  Progress Note Due Date: 06/12/2024     Goals Discussed With: Patient     Short Term Goals to be achieved in: 7 days  Patient will perform rolling in bed: with contact guard assist   Patient will perform supine to sit: with minimal assist   Patient will perform sit to supine: with minimal assist   Patient will perform sit to stand: with moderate assist   Patient will perform transfers: with moderate assist   Patient will ambulate: 15 feet; with four wheeled walker moderate assist      Long Term Goals to be achieved in: 30 days  Patient will perform sit to stand: with minimal assist to contact guard assist    Patient will perform transfers: with minimal assist to contact guard assist    Patient will ambulate: 50 feet; with four wheeled walker minimal assist       PT Recommendations   Discharge Recommendation: physical therapy, would benefit from continued therapy   Discharge Concerns: requires assistance for mobility, requires supervision for mobility    AM-PAC - Activity Measure for Post-Acute Care   AM-PAC Basic Mobility Raw Score: 13   AM-PAC Basic Mobility t-Scale Score: 33.9   AM-PAC Basic Mobility CMS 0-100% Score: 57.65%      Evaluation Completed by: Rosaline DEL. [place], PT, DPT

## 2024-04-24 NOTE — Nursing Note
 Potassium came back at 3.7, Creatinine 1.6, informed Dr. Trudy, no correction needed for now as per MD.

## 2024-04-24 NOTE — Progress Notes
 [facility] - Admission Medication Reconciliation Note      Patient Name: Jodi Bentley  Medical Record Number: [id number]  Admit date: 04/23/2024 5:55 PM    Age: 83 y.o.  Sex: female  Allergies:   Allergies   Allergen Reactions    Ace Inhibitors Cough     Other reaction(s): Other (See Comments)  cough  cough  cough      Sulfasalazine     Sulfa Antibiotics Rash and Other (See Comments)     Height:   Most recent documented height   04/23/24 1.727 m (5' 8'')     Actual Weight:   Most recent documented weight   04/23/24 59.3 kg   04/20/24 56.7 kg     Diagnosis: The patient is currently admitted with the following concerns/issues: Active Problems:    * No active hospital problems. *      Reported Medication History   I used the facility [facility] via fax to update the home medication list for this hospital admission.    PTA Medication List (discrepancies are noted)   Medications Prior to Admission   Medication Sig Last Dose/Taking    acetaminophen  500 mg tablet Take 1 tablet (500 mg total) by mouth every six (6) hours as needed for Pain (mild to moderate pain). Taking As Needed    acetaminophen  500 mg tablet Take 2 tablets (1,000 mg total) by mouth every six (6) hours as needed for Pain or Fever (T>38.5C or 101.78F) (severe pain). Taking As Needed    amLODIPine  10 mg tablet Take 10 mg once daily as needed for systolic blood pressure above 839 mmHg. (Patient taking differently: Take 1 tablet (10 mg total) by mouth daily as needed (SBP > 160).) Taking Differently    busPIRone  15 mg tablet Take 1 tablet (15 mg total) by mouth three (3) times daily. Taking    busPIRone  15 mg tablet Take 1 tablet (15 mg total) by mouth daily as needed (anxiety/agitation). Taking As Needed    HYDROcodone-acetaminophen  5-325 mg tablet Take 1 tablet by mouth every four (4) hours as needed for Moderate Pain (Pain Scale 4-6) or Severe Pain (Pain Scale 7-10). Taking As Needed    lidocaine  5% patch APPLY 1 PATCH TOPICALLY DAILY (12 HOURS ON AND 12 HOURS OFF) TO LOWER BACK (Patient taking differently: Place 1 patch onto the skin daily.) Taking Differently    loperamide 2 mg capsule Take 1 capsule (2 mg total) by mouth every six (6) hours as needed for Diarrhea. Taking As Needed    loratadine  10 mg tablet TAKE 1 TABLET BY MOUTH DAILY DX: ALLERGIES (Patient taking differently: Take 1 tablet (10 mg total) by mouth daily.) Taking Differently    mineral oil-hydrophilic petrolatum (AQUAPHOR) ointment Apply 1 g topically two (2) times daily. Apply to feet Taking    pancrelipase , Lip-Prot-Amyl, (CREON ) 36000 units DR capsule Take 1 capsule (36,000 units of lipase total) by mouth three (3) times daily with meals. Taking    pantoprazole  40 mg DR tablet TAKE 1 TABLET BY MOUTH DAILY GIVE AT 6:30AM BEFORE BREAKFAST (DO NOT CRUSH) FOR DX: GERD (Patient taking differently: Take 1 tablet (40 mg total) by mouth every morning before breakfast.) Taking Differently    QUEtiapine  25 mg tablet Take 0.5 tablets (12.5 mg total) by mouth at bedtime. Taking    rivastigmine  4.5 mg capsule Take 1 capsule (4.5 mg total) by mouth two (2) times daily before meals. Taking    venlafaxine  150 mg 24 hr capsule  Take 2 capsules (300 mg total) by mouth daily. Taking    vitamin D, cholecalciferol, 25 mcg (1000 units) tablet TAKE 1 TABLET BY MOUTH DAILY FOR DX: SUPPLEMENT (Patient taking differently: Take 1 tablet (25 mcg total) by mouth daily.) Taking Differently       Discharge Prescription Preference:   [facility], [place] - [address]  [address]  [place] [id number]      The patient's allergies and medications have been reviewed and updated.     The reconciliation of admission orders with PTA med list is complete. There are no issues requiring follow up at this time.    York Valliant Kristy Carisa Backhaus, PharmD, 04/24/2024, 9:51 AM

## 2024-04-24 NOTE — Progress Notes
 - [facility]   DIVISION OF HOSPITAL MEDICINE  PROGRESS NOTE  DATE OF SERVICE: 04/24/2024  For questions, please message the 1st Provider Contact on Epic Chat or page.  After 5 PM PST, PAGE [phone] for [facility] Patient's    PATIENT: Jodi Bentley               MRN: [id number]   PCP: Lajean Colorado, Chiquita HERO., MD  ADMISSION DATE: 04/23/2024  HOSPITAL DAY: 1  DATE OF SERVICE: 04/24/2024   CC: Generalized Weakness ([facility] from assisted living facility, per ems family wanted patient to be seen due to leaning to the right and having general weakness today.  1100 last known well. )    Subjective & Significant Overnight Events      Seen and examined   NAE       Medications   Objective (click to expand/collapse)   Scheduled:  amLODIPine , 10 mg, Oral, Daily  busPIRone , 15 mg, Oral, TID  cefTRIAXone , 1 g, IV Push, Q24H  lidocaine , 1 patch, Transdermal, Q24H  loratadine , 10 mg, Oral, Daily  pantoprazole , 40 mg, Oral, Daily  QUEtiapine , 12.5 mg, Oral, QHS  rivastigmine , 4.5 mg, Oral, BID AC  venlafaxine , 300 mg, Oral, Daily    Infusions:   sodium chloride  75 mL/hr (04/24/24 1239)       PRN:  acetaminophen  **OR** acetaminophen  suppository, bisacodyl, calcium  gluconate IVPB, cloNIDine, hydrALAZINE , magnesium hydroxide, magnesium sulfate IV **OR** magnesium sulfate IV, melatonin oral/enteral/sublingual, ondansetron **OR** ondansetron injection/IVPB, potassium chloride  **OR** potassium chloride  **OR** potassium chloride  **OR** potassium chloride , senna      Physical Exam     Vital Signs:   Temp:  [36.8 ?C (98.2 ?F)-36.9 ?C (98.4 ?F)] 36.9 ?C (98.4 ?F)  Heart Rate:  [81-116] 98  Resp:  [17-27] 17  BP: (103-205)/(57-89) 168/68  NBP Mean:  [72-111] 101  SpO2:  [86 %-97 %] 97 %       I&O's:   I/O last 2 completed shifts:  In: 491.3 [P.O.:100; I.V.:391.3]  Out: -  Oxygen Support:  Oxygen Therapy  SpO2: 97 %  O2 Device: None (Room air)           Weight for the past 720 hrs (Last 4 readings):   Weight   04/23/24 2059 59.3 kg (130 lb 11.7 oz)     General appearance: alert, appears stated age, and cooperative  Neck: no adenopathy, no carotid bruit, no JVD, supple, symmetrical, trachea midline, and thyroid not enlarged, symmetric, no tenderness/mass/nodules  Lungs: clear to auscultation bilaterally  Heart: regular rate and rhythm, S1, S2 normal, no murmur, click, rub or gallop  Abdomen: soft, non-tender; bowel sounds normal; no masses,  no organomegaly  Extremities: extremities normal, atraumatic, no cyanosis or edema  Skin: Skin color, texture, turgor normal. No rashes or lesions  Neurologic: Grossly normal     Labs/Studies     Labs  Objective (click to expand/collapse)          Micro  Objective (click to expand/collapse)   No results found for this or any previous visit (from the past week).       Radiology  Objective (click to expand/collapse)   US  kidney non-vascular bilat portable  Result Date: 04/24/2024  US  KIDNEY BILAT PORTABLE CLINICAL HISTORY: AKI COMPARISON: PET/CT dated 02/28/2024 TECHNIQUE: Real time grayscale and color Doppler imaging of the kidneys and urinary bladder was performed. FINDINGS: Right kidney: Length = 11.3 cm. Cortical thickness: Normal. Echogenicity: Normal. Collecting system: Mild  hydronephrosis. Other findings: None. Left kidney: Length = 10 cm. Cortical thickness: Normal. Echogenicity: Normal. Collecting system: No hydronephrosis. Other findings: None. Urinary bladder: Irregular bladder wall with debris noted Innumerable liver lesions again identified, in keeping with known history of metastatic neuroendocrine tumor.    IMPRESSION: 1.  Mild right-sided hydronephrosis 2.  Irregular bladder wall with debris present 3.  Innumerable liver lesions Signed by: Adam Plotnik   04/24/2024 11:31 AM    CT brain angiogram w contrast  Result Date: 04/23/2024  CT ANGIOGRAM BRAIN AND NECK WITH CONTRAST COMPARISON: None. HISTORY:  eval posterior circulation, eval posterior circulationeval posterior circulation TECHNIQUE:   Helical acquisition with 1 mm slice collimation was performed from the aortic arch to the vertex.  3D reconstructions also were performed. CONTRAST:  iohexol  (Omnipaque ) 350 mg/mL inj 75 mL:  A total of 75ml was injected intravenously without complication. Estimated Radiation Dose: The patient received the following exposure event(s) during this study, and the dose reference values for each are as shown (CTDIvol in mGy, DLP in mGy-cm). Note that the values are not patient dose but numbers generated from scan acquisition factors based on 32 cm (L) and/or 16 cm (S) phantoms and may substantially under-estimate or over-estimate actual patient dose based on patient size and other factors. 1.8 CTA COW/Carotid, CTDI(L): 10.8, DLP: 5.4;1.8 CTA COW/Carotid, CTDI(L): 14.9, DLP: 605.8 (accession [id number]) FINDINGS:  BRAIN: ANTERIOR CIRCULATION Cavernous and supraclinoid ICA: No flow-limiting stenoses. MCA: No flow-limiting stenoses. ACA: No flow-limiting stenoses PCA:No flow-limiting stenoses POSTERIOR CIRCULATION Basilar artery:No flow-limiting stenoses Intradural vertebral arteries: No flow-limiting stenoses NECK:  The common carotid arteries are normal in course and caliber.  The internal carotid arteries demonstrate no significant narrowing or evidence of significant atherosclerotic disease or dissection.  The vertebral arteries are normal in course, caliber and contour, without evidence of dissection or occlusion.     IMPRESSION: Unremarkable CT angiogram of the brain and neck. . Signed by: Benton Beagle   04/23/2024 4:41 PM    CT neck angiogram w contrast  Result Date: 04/23/2024  CT ANGIOGRAM BRAIN AND NECK WITH CONTRAST COMPARISON: None. HISTORY:  eval posterior circulation, eval posterior circulationeval posterior circulation TECHNIQUE:   Helical acquisition with 1 mm slice collimation was performed from the aortic arch to the vertex.  3D reconstructions also were performed. CONTRAST:  iohexol  (Omnipaque ) 350 mg/mL inj 75 mL:  A total of 75ml was injected intravenously without complication. Estimated Radiation Dose: The patient received the following exposure event(s) during this study, and the dose reference values for each are as shown (CTDIvol in mGy, DLP in mGy-cm). Note that the values are not patient dose but numbers generated from scan acquisition factors based on 32 cm (L) and/or 16 cm (S) phantoms and may substantially under-estimate or over-estimate actual patient dose based on patient size and other factors. 1.8 CTA COW/Carotid, CTDI(L): 10.8, DLP: 5.4;1.8 CTA COW/Carotid, CTDI(L): 14.9, DLP: 605.8 (accession [id number]) FINDINGS:  BRAIN: ANTERIOR CIRCULATION Cavernous and supraclinoid ICA: No flow-limiting stenoses. MCA: No flow-limiting stenoses. ACA: No flow-limiting stenoses PCA:No flow-limiting stenoses POSTERIOR CIRCULATION Basilar artery:No flow-limiting stenoses Intradural vertebral arteries: No flow-limiting stenoses NECK:  The common carotid arteries are normal in course and caliber.  The internal carotid arteries demonstrate no significant narrowing or evidence of significant atherosclerotic disease or dissection.  The vertebral arteries are normal in course, caliber and contour, without evidence of dissection or occlusion.     IMPRESSION: Unremarkable CT angiogram of the brain and neck. . Signed by: Benton Beagle  04/23/2024 4:41 PM    CT brain wo contrast  Result Date: 04/23/2024  CT BRAIN WO CONTRAST HISTORY:r/o bleed, r/o bleed'' COMPARISON: CT brain dated Mar 02, 2024 TECHNIQUE: Multiplanar CT scan of the brain performed without intravenous contrast. CONTRAST: of , administered intravenously without complications. Estimated radiation dose: The patient received the following exposure event(s) during this study, and the dose reference values for each are as shown (CTDIvol in mGy, DLP in mGy-cm). Note that the values are not patient dose but numbers generated from scan acquisition factors based on 32 cm (L) and/or 16 cm (S) phantoms and may substantially under-estimate or over-estimate actual patient dose based on patient size and other factors. 1.4 Routine Head helical, CTDI(S): 56.8, DLP: 1131.9 FINDINGS:  BRAIN PARENCHYMA: No acute intracranial hemorrhage, mass effect, or midline shift. Preserved gray-white matter differentiation. CSF SPACES: Again seen is some generalized volume loss.. Clear basal cisterns. No evidence of hydrocephalus. CALVARIUM: Intact. SINUSES/MASTOIDS: Essentially clear paranasal sinuses and mastoid air cells.     IMPRESSION: No evidence of acute intracranial hemorrhage or mass effect. Signed by: Benton Beagle   04/23/2024 4:37 PM             A&P   Jodi Bentley is a 83 y.o. female admitted for weakness     #Urinary tract infection  - Afebrile,  Urinalysis suggestive of infection.  - Continue ceftriaxone  1 g daily.    - Follow up urine culture and sensitivities  - f/u bld cx     #Hypertensive urgency  #Labile BP  -SBP as high as 194/88 on admission  -Continue home medication amlodipine  10 mg, p.r.n. hydralazine /clonidine to maintain blood pressure <140/90     #Recurrent falls  #Ataxia  -Previous records indicate symptoms have been present for last 2-3 years; may be worsened by active UTI  -CT head, CTA head and neck were largely unremarkable, no focal neurological deficits, low suspicion for CVA.  -Consider MRI brain, neuro consult if no significant improvement.    -Fall precautions, PT/OT consult     #AKI on CKD vs CKD progression   -Continue fluids NS @ 75 cc/hr , follow up   renal/bladder US   IMPRESSION:     1.  Mild right-sided hydronephrosis   2.  Irregular bladder wall with debris present  3.  Innumerable liver lesions  , avoid nephrotoxic agents     #Hyponatremia  -sodium 127>> 133 appropriate  , continue IV fluids, trend BMP     #Dementia  #Mood disorder  -Continue quetiapine  12.5 mg bedtime, venlafaxine  300 mg daily, buspirone  15 mg TID, rivastigmine  4.5mg  BID     #Malignant metastatic NET   -Outpatient F/U with oncology     #DVT ppx  -SCDs     #Code status  -Full Code           # IP Checklist  - Diet: Diet 2 gram sodium  - VTE PPX: SCDs  - LDA:   Peripheral IV 20 G Right Antecubital (1)  Peripheral IV 22 G Anterior;Distal;Left Forearm (0)  External Urinary Device Female external catheter (1)    Advance Care Planning:   Full Code,  Primary Emergency Contact: [name],[name], Home Phone: [phone], Mobile Phone: [phone]    > 25 minutes were spent personally by me today on this encounter which include today's pre-visit review of the chart, obtaining appropriate history, performing an evaluation, documentation and discussion of management with details supported within the note for today's visit. The time documented was exclusive of any  time spent on the separately billed procedure.    Disposition:  Expected post-hospitalization disposition will be to  TBD  .    Author:    Emery RAMAN. Merlin Golden  04/24/2024 3:18 PM  Pager [phone] from 5 PM - 7 AM PST for [facility] Patient's

## 2024-04-24 NOTE — Consults
 Occupational Therapy Evaluation      PATIENT: Jodi Bentley  MRN: [id number]  DOB: 05/13/1941    ADMIT DATE: 04/23/2024     Date of Evaluation: 04/24/2024    Problems: Active Problems:    * No active hospital problems. *       Past Medical History:   Diagnosis Date    Anxiety     Cancer (HCC/RAF)     Dementia (HCC/RAF)     Depression     Histoplasmosis     Past Surgical History:   Procedure Laterality Date    ABDOMINAL SURGERY      BREAST SURGERY          Relevant Hospital Course:  83 yo F with dementia presents to the emergency department due to noted right-sided leaning earlier today.     Earlier today an employee at the facility noted the patient was sitting in her wheelchair, leaning to the right side, which was concerning to them for possible stroke. The patient reports burning with urination and chills for last 2 days, generalized weakness and poor oral intake. No reported slurred speech, visual changes, weakness, numbness. Admitted for IV antibiotic treatment of UTI.    PMH: dementia, HTN, malignant metastatic NET    Patient Stated Goal:  Home    OT Assessment: Pt found supine in bed, agreeable to OT session. Cognitive deficits noted; dementia. PLOF from chart review and family. Assist at baseline however recent functional decline.   MAX A to EOB, unable to maintain sitting balance initially however with increased time and cuing for technique, HOH assist for hand placement, able to maintain with CGA. Bed level ADL completed 2/2 BM. DEP for all ADL tasks, Assist with rolling side to side for peri care and bedding change. Limited by cognitive deficits, impaired balance, decreased endurance, weakness.      Living Arrangements   Type of Home: Facility (Memory care unit)  Home Layout: One level  Bathroom Shower/Tub: Pension scheme manager: Raised  Bathroom Equipment: Grab bars in shower, Grab bars around toilet, Academic librarian Accessibility: Accessible via wheelchair  Home Equipment: Environmental consultant, Biomedical scientist    Prior Level of Function   Level of Independence: Assisted, Functional transfers, Household ambulation, Wheelchair mobility, Other (Comment) (Per daughter, recent decline in function. At baseline, able to ambulate short distance with some assist. Recently in [facility] since decline)  Lives With: Other (Comment) [facility] care unit)  Support Available: Personal care attendant, Other (Comment) Counselling psychologist)  ADL Assistance: Needs assistance  Homemaking Assistance: Needs assistance  Vision: Within Functional Limits  Hearing: Within Functional Limits  Transportation: Driven by Others    Husband and patient both reside in the [facility]; the patient resides in the memory care sector. At baseline the patient is able to ambulate with assistance of walker however is largely noncompliant with noted recurrent falls over last 2-3 years. Since functional decline, was using the WC.    Precautions   Precautions: Fall risk  Orthotic: None  Current Activity Order: Activity as tolerated  Weight Bearing Status: Not Applicable    GENERAL EVALUATION   Position: In bed;Bed alarm on;Hand mittens  Lines/devices Drains: HLIV;Cardiac Monitor  B/B Devices: External cath    Bed Mobility   Supine Scooting: Maximum Assist  Rolling: Moderate Assist  Supine to Sit: Maximum Assist  Sit to Supine: Maximum Assist    Functional Transfers   Sit to Stand: Not Performed  Transfer: Not Performed  Functional Mobility:  Not Performed    Activities of Daily Living (ADLs)   Eating Assistance: Maximum Assist  Eating Deficit: Bringing food to mouth assist (2/2 BUE mittens)  Grooming Assistance: Maximum Assist  Bathing Assistance: Maximum Assist  UB Dressing Assistance: Maximum Assist  LB Dressing Assistance: Dependent  Toileting: Performed  Toileting Assistance: Dependent  Toileting Where Assessed: Bed level    Balance   Sitting - Static: Poor  Sitting - Dynamic: Poor     RUE Assessment   RUE Assessment: Within Functional Limits    LUE Assessment   LUE Assessment: Within Functional Limits    Sensation   Sensation: Grossly intact    Cognition   Safety Awareness: Decreased awareness of need for assistance;Poor awareness of safety precautions  Barriers to Learning: Cognitive Limitations;Physical Limitations;Altered Mental Status    Vision   Current Vision: No visual deficits  Visual History: No reported problems     Pain Assessment   Patient complains of pain: No    Patient Status   Activity Tolerance: Fair  Oxygen Needs: Room Air  Response to Treatment: Tolerated treatment well;Vital signs stable;Other (Comment);Fatigued;with activity (Cognitive deficits)  Call light in reach: Yes  Presentation post treatment: In bed;Side rails up;Bed alarm on;Hand mittens on    Interdisciplinary Communication   Interdisciplinary Communication: Nurse;Physical Therapist    ASSESSMENT   Rehab Potential: Fair  Inpatient Recommendation: OT treatment  Problem List: Decreased UE strength;Discharge needs;Impaired attention span;Impaired functional endurance;Decreased UE function;Decreased self care skills;Decreased cognition;Decreased safety awareness;Decreased functional transfers  Present During Evaluation: Limitation of activities due to disability (Z73.6)  Treatment Plan: ADL training;Caregiver training;Functional balance activities;Functional cognitive tasks;Energy conservation;Gross motor training;Discharge planning;Functional transfer training;Patient and/or family education  Frequency: 3-5 x/week  Duration (days): 30  Progress Note Due Date: 05/01/24    Goals Discussed With: Patient    Short Term Goals to be achieved in: 7 days  Pt will groom self: sitting edge of bed;with moderate assist  Pt will dress upper body: sitting edge of bed;with moderate assist  Pt will perform: stand step transfer;to/from commode;with maximum assist    Long Term Goals to be achieved in: 30 days  Pt will groom self: sitting edge of bed;with minimum assist  Pt will dress upper  body: sitting edge of bed;with minimum assist  Pt will perform: stand step transfer;to/from commode;with minimum assist    OT Recommendations   Discharge Recommendation: Occupational Therapy;Would benefit from continued therapy  Discharge concerns: Requires supervision for mobility;Requires supervision for self care;Impaired cognition;Poor safety awareness;Requires assistance for mobility  Discharge Equipment Recommended: Defer to discharge facility    AM-PAC   AM-PAC Daily Activity Raw Score: 6  AM-PAC Daily Activity t-Scale Score: 17.07  AM-PAC Daily Activity CMS 0-100% Score: 100 %  AM-PAC Daily Activity CMS 'G Code' Modifier: CN    AM-PAC Cognitive Raw Score: 6  AM-PAC Cognitive t-Scale Score: 7.69  AM-PAC Cognitive CMS 0-100% Score: 100 %  AM-PAC Cognitive CMS 'G Code' Modifier: CN      Evaluation Completed by: Burnard Lorene Ada, OT,  04/24/2024

## 2024-04-24 NOTE — Other
 Patient's Clinical Goal:   Clinical Goal(s) for the Shift: Safety and comfort  Identify possible barriers to advancing the care plan: Antibiotic therapy ongoing  Stability of the patient: Moderately Unstable - medium risk of patient condition declining or worsening    Progression of Patient's Clinical Goal: Admitted to floor, alert but disoriented and impulsive, purewick in place. Fluids ongoing. Needs met and attended. Fall precautions in place. Gluteal and vaginal area noted to have blanchable redness. No acute concerns at this time.

## 2024-04-24 NOTE — Consults
 CASE MANAGER ASSESSMENT    Admit Ijuz:[id number]    Date of Initial CM Assessment: 04/24/2024    Problems: Active Problems:    * No active hospital problems. *       Past Medical History:   Diagnosis Date    Anxiety     Cancer (HCC/RAF)     Dementia (HCC/RAF)     Depression     Histoplasmosis     Past Surgical History:   Procedure Laterality Date    ABDOMINAL SURGERY      BREAST SURGERY          Primary Care Physician:Karp Ulysses Chiquita HERO., MD  Phone:[phone]    LANGUAGE ASSISTANCE          NEEDS ASSESSMENT   Is the patient able to participate in the CM Initial Assessment at this time?: No - Patient with AMS  Information Obtained From: Spouse  Level of Function Prior to Admit: Moderate Assist  Primary Living Situation: Facility, Lives w/Capable Spouse  Hours of Assistance/Day: 24/7  Pre-admission Living Situation: Assisted Living Facility  Facility Name: [facility];  Facility Phone Number: [phone]  Primary Support Systems: Spouse/significant other, Children  Contact Name: Charlie  Phone Number: [phone]  Does the patient have a Family/Support System member participating in Discharge Planning?: Yes  DPOA?: Yes  DPOA Type: Medical  Bathroom on Main Floor: Yes  Stairs in Home: 0  Prior Treatments / Services: Home Health  Home Health Type of Service(s) Being Provided: Nursing, Physical Therapy, Occupational Therapy  Home Health Agency Name: unable to recall  Who is your PCP?: Dr. Verlee  Do you have your Primary Care Doctor's office number?: No  How often do you visit your doctor?: Quarterly  Do you need information/education regarding your medical condition?: No               DISCHARGE ASSESSMENT   Projected Date of Discharge:     Anticipated Complex D/C?: No  Projected Discharge to: Assisted Living Facility  Discharge Address: [address] [facility] [address]  Projected Discharge Needs: Home Health  Support Identified at Discharge: Spouse  Name of Discharge Support Person: Charlie  Phone Number: [phone]  Who is available to transport you upon discharge?: Family Transportation         SDOH Screening   Within the past 12 months, has a lack of transportation kept you from medical appointments, meetings, work, or from getting things needed for daily living? : No  How hard is it for you to pay for the very basics like food, housing, medical care, and heating?: Not hard at all  How hard is it for you to pay for prescriptions or medical bills?: Not hard at all  In the past 12 months has the electric, gas, oil, or water company threatened to shut off services in your home?: No  In the last 12 months, was there a time when you were not able to pay the mortgage or rent on time?: No  In the last 12 months, how many places have you lived?: 0  In the last 12 months, was there a time when you did not have a steady place to sleep or slept in a shelter (including now)?: No             [employer], LCSW,  04/24/2024

## 2024-04-25 DIAGNOSIS — R339 Retention of urine, unspecified: Secondary | ICD-10-CM

## 2024-04-25 LAB — Comprehensive Metabolic Panel

## 2024-04-25 LAB — Bacterial Culture Urine
BACTERIAL CULTURE URINE: >100000 — AB
BACTERIAL CULTURE URINE: >100000 — AB

## 2024-04-25 LAB — Blood Culture Detection
BLOOD CULTURE FINAL STATUS: NEGATIVE
BLOOD CULTURE FINAL STATUS: NEGATIVE
BLOOD CULTURE FINAL STATUS: NEGATIVE

## 2024-04-25 LAB — Basic Metabolic Panel: ESTIMATED GFR 2021 CKD-EPI: 32 mL/min/1.73m2 (ref 136–145)

## 2024-04-25 LAB — CBC: ABSOLUTE NUCLEATED RBC COUNT: 0 x10E3/uL (ref 0.00–0.00)

## 2024-04-25 LAB — Differential Automated: ABSOLUTE MONO COUNT: 1.17 x10E3/uL — ABNORMAL HIGH (ref 0.20–0.80)

## 2024-04-25 MED ADMIN — VENLAFAXINE HCL ER 75 MG PO CP24: 300 mg | ORAL | @ 16:00:00 | Stop: 2024-04-30 | NDC 68084070911

## 2024-04-25 MED ADMIN — LIDOCAINE 5 % EX PTCH: 1 | TRANSDERMAL | @ 13:00:00 | Stop: 2024-04-30

## 2024-04-25 MED ADMIN — PANTOPRAZOLE SODIUM 40 MG PO TBEC: 40 mg | ORAL | @ 16:00:00 | Stop: 2024-04-30 | NDC 60687073609

## 2024-04-25 MED ADMIN — ERTAPENEM 1 G IN SWFI IVP: 500 mg | INTRAVENOUS | @ 23:00:00 | Stop: 2024-04-30 | NDC 60505619600

## 2024-04-25 MED ADMIN — RIVASTIGMINE TARTRATE 1.5 MG PO CAPS: 4.5 mg | ORAL | @ 23:00:00 | Stop: 2024-04-30 | NDC 00904710761

## 2024-04-25 MED ADMIN — QUETIAPINE FUMARATE 25 MG PO TABS: 12.5 mg | ORAL | @ 04:00:00 | Stop: 2024-04-30 | NDC 60687032711

## 2024-04-25 MED ADMIN — BUSPIRONE HCL 10 MG PO TABS: 15 mg | ORAL | @ 21:00:00 | Stop: 2024-04-30 | NDC 00904712161

## 2024-04-25 MED ADMIN — BUSPIRONE HCL 10 MG PO TABS: 15 mg | ORAL | @ 04:00:00 | Stop: 2024-04-30 | NDC 00904712161

## 2024-04-25 MED ADMIN — ACETAMINOPHEN 325 MG PO TABS: 650 mg | ORAL | @ 16:00:00 | Stop: 2024-04-30 | NDC 50580045811

## 2024-04-25 MED ADMIN — CEFTRIAXONE 1 G IN SWFI IVP: 1 g | INTRAVENOUS | Stop: 2024-04-25 | NDC 00409733211

## 2024-04-25 MED ADMIN — RIVASTIGMINE TARTRATE 1.5 MG PO CAPS: 4.5 mg | ORAL | @ 13:00:00 | Stop: 2024-04-30 | NDC 00904710761

## 2024-04-25 MED ADMIN — LIDOCAINE 5 % EX PTCH: 1 | TRANSDERMAL | @ 01:00:00 | Stop: 2024-04-30 | NDC 82347050505

## 2024-04-25 MED ADMIN — RIVASTIGMINE TARTRATE 1.5 MG PO CAPS: 4.5 mg | ORAL | Stop: 2024-04-30 | NDC 00904710761

## 2024-04-25 MED ADMIN — AMLODIPINE BESYLATE 10 MG PO TABS: 10 mg | ORAL | @ 16:00:00 | Stop: 2024-04-30 | NDC 60687049611

## 2024-04-25 MED ADMIN — SODIUM CHLORIDE 0.9 % IV SOLN: 75 mL/h | INTRAVENOUS | @ 10:00:00 | Stop: 2024-04-26 | NDC 00338004904

## 2024-04-25 MED ADMIN — MELATONIN 3 MG PO TABS: 6 mg | ORAL | @ 04:00:00 | Stop: 2024-04-30

## 2024-04-25 MED ADMIN — BUSPIRONE HCL 10 MG PO TABS: 15 mg | ORAL | @ 13:00:00 | Stop: 2024-04-30 | NDC 00904712161

## 2024-04-25 MED ADMIN — SODIUM CHLORIDE 0.9 % IV SOLN: 75 mL/h | INTRAVENOUS | Stop: 2024-04-26 | NDC 00338004904

## 2024-04-25 MED ADMIN — LORATADINE 10 MG PO TABS: 10 mg | ORAL | @ 16:00:00 | Stop: 2024-04-30 | NDC 58602070283

## 2024-04-25 NOTE — Other
 Patient's Clinical Goal:   Clinical Goal(s) for the Shift: V/S stable, No falls, Continue IV abx for the UTI  Identify possible barriers to advancing the care plan:   Stability of the patient: Moderately Stable - low risk of patient condition declining or worsening   Progression of Patient's Clinical Goal:     A&O x 1, Bedrest, pending PT/OT eval. [facility]. M/S status, on room air. IV Ceftriaxone  for the UTI. Pending urine culture. Family at bedside and updated. Went into retention in the afternoon, bladder scan Q6. Straight cath done - 1215 out.

## 2024-04-25 NOTE — Other
 Patient's Clinical Goal:   Clinical Goal(s) for the Shift: Safety and comfort  Identify possible barriers to advancing the care plan: Deconditioning  Stability of the patient: Moderately Stable - low risk of patient condition declining or worsening   Progression of Patient's Clinical Goal: Goal met, patient able to sleep for the night. Needs met and attended. No acute concerns at this time. Fluids ongoing.

## 2024-04-25 NOTE — Nursing Note
 Potassium at 3.6, Creatinine at 1.7 this morning, informed Dr. Trudy, no correction needed for now as per MD.

## 2024-04-25 NOTE — Progress Notes
 [facility] DEPARTMENT OF MEDICINE   DIVISION OF HOSPITAL MEDICINE  PROGRESS NOTE  DATE OF SERVICE: 04/25/2024  For questions, please message the 1st Provider Contact on Epic Chat or page.  After 5 PM PST, PAGE [phone] for [facility] Patients    PATIENT: Jodi Bentley               MRN: [id number]   PCP: Lajean Colorado, Chiquita HERO., MD  ADMISSION DATE: 04/23/2024  HOSPITAL DAY: 2  DATE OF SERVICE: 04/25/2024   CC: Generalized Weakness (Biba from assisted living facility, per ems family wanted patient to be seen due to leaning to the right and having general weakness today.  1100 last known well. )    Subjective & Significant Overnight Events    No acute events, discussed with RN  Patient is alert and answers questions. Oriented to self    Medications   Objective (click to expand/collapse)   Scheduled:  amLODIPine , 10 mg, Oral, Daily  busPIRone , 15 mg, Oral, TID  ertapenem  IV, 500 mg, IV Push, Q24H  lidocaine , 1 patch, Transdermal, Q24H  loratadine , 10 mg, Oral, Daily  pantoprazole , 40 mg, Oral, Daily  QUEtiapine , 12.5 mg, Oral, QHS  rivastigmine , 4.5 mg, Oral, BID AC  venlafaxine , 300 mg, Oral, Daily    Infusions:   sodium chloride  75 mL/hr (04/25/24 0238)       PRN:  acetaminophen  **OR** acetaminophen  suppository, bisacodyl, calcium  gluconate IVPB, cloNIDine, hydrALAZINE , magnesium hydroxide, magnesium sulfate IV **OR** magnesium sulfate IV, melatonin oral/enteral/sublingual, ondansetron **OR** ondansetron injection/IVPB, potassium chloride  **OR** potassium chloride  **OR** potassium chloride  **OR** potassium chloride , senna      Physical Exam     Vital Signs:   Temp:  [36.5 ?C (97.7 ?F)-37 ?C (98.6 ?F)] 36.7 ?C (98.1 ?F)  Heart Rate:  [79-101] 92  Resp:  [16-18] 17  BP: (123-161)/(39-91) 123/73  NBP Mean:  [68-115] 90  SpO2:  [94 %-98 %] 95 %       I&O's:   I/O last 2 completed shifts:  In: 820 [P.O.:620; Other:200]  Out: 1603 [Urine:1600; Stool:3] Oxygen Support:  Oxygen Therapy  SpO2: 95 %  O2 Device: None (Room air) Weight for the past 720 hrs (Last 4 readings):   Weight   04/23/24 2059 59.3 kg (130 lb 11.7 oz)     General appearance: alert, appears stated age, and cooperative  Lungs: clear to auscultation bilaterally  Heart: regular rate and rhythm, S1, S2 normal, no murmur, click, rub or gallop  Abdomen: soft, non-tender; bowel sounds normal; no masses,  no organomegaly  Extremities: extremities normal, atraumatic, no cyanosis or edema  Skin: Skin color, texture, turgor normal. No rashes or lesions  Neurologic: Grossly normal     Labs/Studies     Labs  Objective (click to expand/collapse)   Last CBC:   Results for orders placed or performed during the hospital encounter of 04/23/24   CBC   Result Value Ref Range    White Blood Cell Count 10.19 (H) 4.16 - 9.95 x10E3/uL    Red Blood Cell Count 3.73 (L) 3.96 - 5.09 x10E6/uL    Hemoglobin 11.7 11.6 - 15.2 g/dL    Hematocrit 65.6 (L) 34.9 - 45.2 %    Mean Corpuscular Volume 92.0 79.3 - 98.6 fL    Mean Corpuscular Hemoglobin 31.4 26.4 - 33.4 pg    MCH Concentration 34.1 31.5 - 35.5 g/dL    Red Cell Distribution Width-SD 48.9 (H) 36.9 - 48.3 fL    Red Cell  Distribution Width-CV 14.4 11.1 - 15.5 %    Platelet Count, Auto 142 (L) 143 - 398 x10E3/uL    Mean Platelet Volume 11.9 9.3 - 13.0 fL    Nucleated RBC%, automated 0.0 No Ref. Range %    Absolute Nucleated RBC Count 0.00 0.00 - 0.00 x10E3/uL   Differential, Automated   Result Value Ref Range    Neutrophil Percent, Auto 79.3 No Ref. Range %    Lymphocyte Percent, Auto 7.6 No Ref. Range %    Monocyte Percent, Auto 11.5 No Ref. Range %    Eosinophil Percent, Auto 0.8 No Ref. Range %    Basophil Percent, Auto 0.4 No Ref. Range %    Immature Granulocytes% 0.4 No Reference Range %    Absolute Neut Count 8.09 (H) 1.80 - 6.90 x10E3/uL    Absolute Lymphocyte Count 0.77 (L) 1.30 - 3.40 x10E3/uL    Absolute Mono Count 1.17 (H) 0.20 - 0.80 x10E3/uL    Absolute Eos Count 0.08 0.00 - 0.50 x10E3/uL    Absolute Baso Count 0.04 0.00 - 0.10 x10E3/uL    Absolute Immature Gran Count 0.04 0.00 - 0.04 x10E3/uL   CBC & Auto Differential    Narrative    The following orders were created for panel order CBC & Auto Differential.  Procedure                               Abnormality         Status                     ---------                               -----------         ------                     RAR[[id number]]                          Abnormal            Final result               Differential, Automated[[id number]]      Abnormal            Final result                 Please view results for these tests on the individual orders.      Results for orders placed or performed during the hospital encounter of 04/23/24   CBC   Result Value Ref Range    White Blood Cell Count 14.14 (H) 4.16 - 9.95 x10E3/uL    Red Blood Cell Count 4.00 3.96 - 5.09 x10E6/uL    Hemoglobin 12.6 11.6 - 15.2 g/dL    Hematocrit 61.9 65.0 - 45.2 %    Mean Corpuscular Volume 95.0 79.3 - 98.6 fL    Mean Corpuscular Hemoglobin 31.5 26.4 - 33.4 pg    MCH Concentration 33.2 31.5 - 35.5 g/dL    Red Cell Distribution Width-SD 50.2 (H) 36.9 - 48.3 fL    Red Cell Distribution Width-CV 14.5 11.1 - 15.5 %    Platelet Count, Auto 159 143 - 398 x10E3/uL    Mean Platelet Volume 11.9 9.3 - 13.0 fL    Nucleated RBC%,  automated 0.0 No Ref. Range %    Absolute Nucleated RBC Count 0.00 0.00 - 0.00 x10E3/uL     Last CMP:   Results for orders placed or performed during the hospital encounter of 04/23/24   Comprehensive Metabolic Panel   Result Value Ref Range    Sodium 135 (L) 136 - 145 mmol/L    Potassium 3.6 3.5 - 5.1 mmol/L    Chloride 103 96 - 106 mmol/L    Total CO2 25 21 - 32 mmol/L    Anion Gap 7 (L) 8 - 19 mmol/L    Glucose 100 74 - 106 mg/dL    Creatinine 8.29 (H) 0.55 - 1.30 mg/dL    Estimated GFR 29 See GFR Additional Information mL/min/1.22m2    GFR Additional Information See Comment     Urea Nitrogen 24 (H) 7 - 18 mg/dL    Calcium  8.7 8.5 - 10.1 mg/dL    Total Protein 5.9 (L) 6.4 - 8.2 g/dL Albumin 2.7 (L) 3.4 - 5.0 g/dL    Bilirubin,Total 0.3 0.2 - 1.0 mg/dL    Alkaline Phosphatase 108 45 - 117 U/L    Aspartate Aminotransferase 17 15 - 37 U/L    Alanine Aminotransferase 20 13 - 56 U/L     Last BMP:   Results for orders placed or performed during the hospital encounter of 04/23/24   Basic Metabolic Panel   Result Value Ref Range    Sodium 133 (L) 136 - 145 mmol/L    Potassium 3.7 3.5 - 5.1 mmol/L    Chloride 103 98 - 107 mmol/L    Total CO2 21 21 - 32 mmol/L    Anion Gap 9 8 - 19 mmol/L    Glucose 149 (H) 74 - 106 mg/dL    Creatinine 8.39 (H) 0.55 - 1.30 mg/dL    Estimated GFR 32 See GFR Additional Information mL/min/1.38m2    GFR Additional Information See Comment     Urea Nitrogen 23 (H) 7 - 18 mg/dL    Calcium  8.3 (L) 8.5 - 10.1 mg/dL          Micro  Objective (click to expand/collapse)   Recent Results (from the past week)   Bacterial Culture Urine    Collection Time: 04/23/24  4:10 PM    Specimen: Clean Catch, Midstream; Urine   Result Value Ref Range    Bacterial Culture Urine >100,000 CFU/mL Escherichia coli (A)        Susceptibility    Escherichia coli - MIC (MCG/ML)*     Amoxicillin + Clavulanate 16 Intermediate      Ampicillin >=32 Resistant      Oral Cephalosporins R Resistant      Ceftriaxone  >=64 Resistant      Ciprofloxacin  >=4 Resistant      Ertapenem  <=0.12 Susceptible      Levofloxacin >=8 Resistant      Nitrofurantoin  <=16 Susceptible      Tobramycin 8 Resistant      Trimethoprim/Sulfamethoxazole <=20 Susceptible      * Nitrofurantoin  should not be used in patients with CrCl < 30 mL/min or in patients with suspected or confirmed pyelonephritis.    For complicated infections, carbapenems are preferred for these ceftriaxone  resistant pathogens.   Bacterial Culture Blood    Collection Time: 04/24/24  4:28 PM    Specimen: Peripheral Vein; Blood   Result Value Ref Range    Blood Culture - Preliminary status Negative To Date Negative   Bacterial Culture Blood  Collection Time: 04/24/24 4:57 PM    Specimen: Peripheral Vein #2; Blood   Result Value Ref Range    Blood Culture - Preliminary status Negative To Date Negative          Radiology  Objective (click to expand/collapse)   No imaging has been resulted in the last 24 hours       Additional studies   Objective (click to expand/collapse)   None       Independent Review of Studies  None    I reviewed these notes that directly relate to the patient's acute and/or chronic medical problems with documentation of the salient findings if any:  Inpatient notes:   None         A&P   Jodi Bentley is a 83 y.o. female admitted for generalized weakness    #Urinary tract infection  -urine culture with ESBL E coli  - switch from ceftriaxone  to ertapenem  500 mg IV q24h (renally dosed)  - f/u blood cultures, NGTD    #AKI on CKD vs CKD progression, suspect component of post obtructive  #Urinary retention  #Mild R hydronephrosis  - continue IVF   - PVR with 1000 cc, straight cath prn, may need foley. Continue checking PVR  - repeat renal US  when bladder adequately decompressed to ensure resolution of mild R hydro     #Hypertensive urgency  #Labile BP  -SBP as high as 194/88 on admission  -Continue home medication amlodipine  10 mg, p.r.n. hydralazine /clonidine to maintain blood pressure <140/90     #Recurrent falls  #Ataxia  -Previous records indicate symptoms have been present for last 2-3 years; may be worsened by active UTI  -CT head, CTA head and neck were largely unremarkable, no focal neurological deficits, low suspicion for CVA.  -Consider MRI brain, neuro consult if no significant improvement.    -Fall precautions, PT/OT consult       #Hyponatremia  -sodium 127>> 133 appropriate  , continue IV fluids, trend BMP     #Dementia  #Mood disorder  -Continue quetiapine  12.5 mg bedtime, venlafaxine  300 mg daily, buspirone  15 mg TID, rivastigmine  4.5mg  BID     #Malignant metastatic NET   -Outpatient F/U with oncology          # IP Checklist  - Diet: Diet 2 gram sodium  - VTE PPX: Prophylactic Heparin   - LDA:   Peripheral IV 20 G Right Antecubital (2)  Peripheral IV 22 G Anterior;Distal;Left Forearm (1)  External Urinary Device Female external catheter (2)    Advance Care Planning:   Full Code,  Primary Emergency Contact: [name],[name], Home Phone: [phone], Mobile Phone: [phone]    51 minutes were spent personally by me today on this encounter which include today's pre-visit review of the chart, obtaining appropriate history, performing an evaluation, documentation and discussion of management with details supported within the note for today's visit. The time documented was exclusive of any time spent on the separately billed procedure.    Disposition:  Expected post-hospitalization disposition will be to Home.    Author:    Kabe Mckoy R. Kellyn Mccary  04/25/2024 3:45 PM  Pager [phone] from 5 PM - 7 AM PST for [facility] Patients

## 2024-04-25 NOTE — Other
 Patient's Clinical Goal:   Clinical Goal(s) for the Shift: V/S stable, No falls, Continue IV abx for the UTI  Identify possible barriers to advancing the care plan: AMS  Stability of the patient: Moderately Stable - low risk of patient condition declining or worsening     Progression of Patient's Clinical Goal:   Patient AOx1, confused, trying to get out of bed. Careview in placed, bed alarm on level 2, Mitten and abd belt in place.   Received phone call from assisted living and gave them update on patient.   Offered water. Puwick in place.   Bladder scan Q6hrs: @2130  240cc                                     @0400  850cc. SC 900cc out.     Plan:  -Bladder scan Q6hrs  -NS@75  for hypoNa  -Antibiotics for UTI

## 2024-04-26 ENCOUNTER — Ambulatory Visit: Payer: TRICARE (CHAMPUS)

## 2024-04-26 DIAGNOSIS — N1330 Unspecified hydronephrosis: Secondary | ICD-10-CM

## 2024-04-26 DIAGNOSIS — R339 Retention of urine, unspecified: Principal | ICD-10-CM

## 2024-04-26 LAB — CBC: WHITE BLOOD CELL COUNT: 10.4 x10E3/uL — ABNORMAL HIGH (ref 4.16–9.95)

## 2024-04-26 LAB — Comprehensive Metabolic Panel: ANION GAP: 9 mmol/L (ref 8–19)

## 2024-04-26 LAB — Differential Automated: NEUTROPHIL PERCENT, AUTO: 77.8 % (ref 0.20–0.80)

## 2024-04-26 MED ADMIN — PANTOPRAZOLE SODIUM 40 MG PO TBEC: 40 mg | ORAL | @ 16:00:00 | Stop: 2024-04-30 | NDC 60687073609

## 2024-04-26 MED ADMIN — SODIUM CHLORIDE 0.9 % IV SOLN: 75 mL/h | INTRAVENOUS | @ 13:00:00 | Stop: 2024-04-26 | NDC 00338004904

## 2024-04-26 MED ADMIN — BUSPIRONE HCL 10 MG PO TABS: 15 mg | ORAL | @ 13:00:00 | Stop: 2024-04-30 | NDC 00904712161

## 2024-04-26 MED ADMIN — LIDOCAINE 5 % EX PTCH: 1 | TRANSDERMAL | @ 12:00:00 | Stop: 2024-04-30

## 2024-04-26 MED ADMIN — AMLODIPINE BESYLATE 10 MG PO TABS: 10 mg | ORAL | @ 16:00:00 | Stop: 2024-04-30 | NDC 60687049611

## 2024-04-26 MED ADMIN — VENLAFAXINE HCL ER 75 MG PO CP24: 300 mg | ORAL | @ 16:00:00 | Stop: 2024-04-30 | NDC 68084070911

## 2024-04-26 MED ADMIN — ERTAPENEM 1 G IN SWFI IVP: 500 mg | INTRAVENOUS | Stop: 2024-04-30 | NDC 60505619600

## 2024-04-26 MED ADMIN — HEPARIN SODIUM (PORCINE) 5000 UNIT/ML IJ SOLN: 5000 [IU] | SUBCUTANEOUS | @ 16:00:00 | Stop: 2024-04-30 | NDC 00409272330

## 2024-04-26 MED ADMIN — QUETIAPINE FUMARATE 25 MG PO TABS: 12.5 mg | ORAL | @ 04:00:00 | Stop: 2024-04-30 | NDC 60687032711

## 2024-04-26 MED ADMIN — BUSPIRONE HCL 10 MG PO TABS: 15 mg | ORAL | @ 04:00:00 | Stop: 2024-04-30 | NDC 00904712161

## 2024-04-26 MED ADMIN — HEPARIN SODIUM (PORCINE) 5000 UNIT/ML IJ SOLN: 5000 [IU] | SUBCUTANEOUS | @ 04:00:00 | Stop: 2024-04-30 | NDC 00409272330

## 2024-04-26 MED ADMIN — RIVASTIGMINE TARTRATE 1.5 MG PO CAPS: 4.5 mg | ORAL | @ 13:00:00 | Stop: 2024-04-30 | NDC 60687077711

## 2024-04-26 MED ADMIN — LIDOCAINE 5 % EX PTCH: 1 | TRANSDERMAL | Stop: 2024-04-30 | NDC 82347050505

## 2024-04-26 MED ADMIN — LORATADINE 10 MG PO TABS: 10 mg | ORAL | @ 16:00:00 | Stop: 2024-04-30 | NDC 58602070283

## 2024-04-26 MED ADMIN — POTASSIUM CHLORIDE CRYS ER 20 MEQ PO TBCR: 40 meq | ORAL | @ 16:00:00 | Stop: 2024-04-26 | NDC 00245531989

## 2024-04-26 MED ADMIN — SODIUM CHLORIDE 0.9 % IV SOLN: 75 mL/h | INTRAVENOUS | @ 16:00:00 | Stop: 2024-04-26

## 2024-04-26 MED ADMIN — SODIUM CHLORIDE 0.9 % IV SOLN: 75 mL/h | INTRAVENOUS | @ 02:00:00 | Stop: 2024-04-26

## 2024-04-26 MED ADMIN — BUSPIRONE HCL 10 MG PO TABS: 15 mg | ORAL | @ 19:00:00 | Stop: 2024-04-30 | NDC 00904712161

## 2024-04-26 MED ADMIN — RIVASTIGMINE TARTRATE 1.5 MG PO CAPS: 4.5 mg | ORAL | Stop: 2024-04-30 | NDC 60687077711

## 2024-04-26 NOTE — Other
 Patient's Clinical Goal:   Clinical Goal(s) for the Shift: No falls, V/S stable, Monitor for retention, Antibiotics for UTI  Identify possible barriers to advancing the care plan: AMS  Stability of the patient: Moderately Stable - low risk of patient condition declining or worsening   Progression of Patient's Clinical Goal:   Aox1, RA, still impulsive. Abd belt and mittens with Care view in place. No complain of pain.BM x1  Foley in place for acute retention.     Plan: Pending placement.

## 2024-04-26 NOTE — Progress Notes
 [facility] DEPARTMENT OF MEDICINE   DIVISION OF HOSPITAL MEDICINE  PROGRESS NOTE  DATE OF SERVICE: 04/26/2024  For questions, please message the 1st Provider Contact on Epic Chat or page.  After 5 PM PST, PAGE [phone] for [facility] Patients    PATIENT: Jodi Bentley               MRN: [id number]   PCP: Lajean Colorado, Chiquita HERO., MD  ADMISSION DATE: 04/23/2024  HOSPITAL DAY: 3  DATE OF SERVICE: 04/26/2024   CC: Generalized Weakness ([facility]iba from assisted living facility, per ems family wanted patient to be seen due to leaning to the right and having general weakness today.  1100 last known well. )    Subjective & Significant Overnight Events    Having urinary retention, straight cath x 2 overnight  PVR 800 this morning  Foley catheter placed  Updated husband at bedside as well as daughter over the phone    Medications   Objective (click to expand/collapse)   Scheduled:  amLODIPine , 10 mg, Oral, Daily  busPIRone , 15 mg, Oral, TID  ertapenem  IV, 500 mg, IV Push, Q24H  heparin , 5,000 Units, Subcutaneous, BID  lidocaine , 1 patch, Transdermal, Q24H  loratadine , 10 mg, Oral, Daily  pantoprazole , 40 mg, Oral, Daily  QUEtiapine , 12.5 mg, Oral, QHS  rivastigmine , 4.5 mg, Oral, BID AC  venlafaxine , 300 mg, Oral, Daily    Infusions:        PRN:  acetaminophen  **OR** acetaminophen  suppository, bisacodyl, calcium  gluconate IVPB, cloNIDine, hydrALAZINE , magnesium hydroxide, magnesium sulfate IV **OR** magnesium sulfate IV, melatonin oral/enteral/sublingual, ondansetron **OR** ondansetron injection/IVPB, potassium chloride  **OR** potassium chloride  **OR** potassium chloride  **OR** potassium chloride , senna      Physical Exam     Vital Signs:   Temp:  [36.5 ?C (97.7 ?F)-37.6 ?C (99.7 ?F)] 37 ?C (98.6 ?F)  Heart Rate:  [64-83] 75  Resp:  [17-19] 18  BP: (126-159)/(66-87) 126/69  NBP Mean:  [87-107] 88  SpO2:  [95 %-98 %] 95 %       I&O's:   I/O last 2 completed shifts:  In: 1950 [P.O.:1050; I.V.:900]  Out: 2650 [Urine:2650] Oxygen Support:  Oxygen Therapy  SpO2: 95 %  O2 Device: None (Room air)           Weight for the past 720 hrs (Last 4 readings):   Weight   04/23/24 2059 59.3 kg (130 lb 11.7 oz)     General appearance: alert, appears stated age, and cooperative  Lungs: clear to auscultation bilaterally  Heart: regular rate and rhythm, S1, S2 normal, no murmur, click, rub or gallop  Abdomen: soft, non-tender; bowel sounds normal; no masses,  no organomegaly  Extremities: extremities normal, atraumatic, no cyanosis or edema  Skin: Skin color, texture, turgor normal. No rashes or lesions  Neurologic: Grossly normal     Labs/Studies     Labs  Objective (click to expand/collapse)   Last CBC:   Results for orders placed or performed during the hospital encounter of 04/23/24   CBC   Result Value Ref Range    White Blood Cell Count 10.40 (H) 4.16 - 9.95 x10E3/uL    Red Blood Cell Count 3.78 (L) 3.96 - 5.09 x10E6/uL    Hemoglobin 11.7 11.6 - 15.2 g/dL    Hematocrit 65.1 (L) 34.9 - 45.2 %    Mean Corpuscular Volume 92.1 79.3 - 98.6 fL    Mean Corpuscular Hemoglobin 31.0 26.4 - 33.4 pg    MCH Concentration 33.6 31.5 -  35.5 g/dL    Red Cell Distribution Width-SD 48.9 (H) 36.9 - 48.3 fL    Red Cell Distribution Width-CV 14.4 11.1 - 15.5 %    Platelet Count, Auto 155 143 - 398 x10E3/uL    Mean Platelet Volume 12.0 9.3 - 13.0 fL    Nucleated RBC%, automated 0.0 No Ref. Range %    Absolute Nucleated RBC Count 0.00 0.00 - 0.00 x10E3/uL   Differential, Automated   Result Value Ref Range    Neutrophil Percent, Auto 77.8 No Ref. Range %    Lymphocyte Percent, Auto 8.9 No Ref. Range %    Monocyte Percent, Auto 9.1 No Ref. Range %    Eosinophil Percent, Auto 3.4 No Ref. Range %    Basophil Percent, Auto 0.3 No Ref. Range %    Immature Granulocytes% 0.5 No Reference Range %    Absolute Neut Count 8.09 (H) 1.80 - 6.90 x10E3/uL    Absolute Lymphocyte Count 0.93 (L) 1.30 - 3.40 x10E3/uL    Absolute Mono Count 0.95 (H) 0.20 - 0.80 x10E3/uL    Absolute Eos Count 0.35 0.00 - 0.50 x10E3/uL    Absolute Baso Count 0.03 0.00 - 0.10 x10E3/uL    Absolute Immature Gran Count 0.05 (H) 0.00 - 0.04 x10E3/uL   CBC & Auto Differential    Narrative    The following orders were created for panel order CBC & Auto Differential.  Procedure                               Abnormality         Status                     ---------                               -----------         ------                     RAR[[id number]]                          Abnormal            Final result               Differential, Automated[[id number]]      Abnormal            Final result                 Please view results for these tests on the individual orders.      Results for orders placed or performed during the hospital encounter of 04/23/24   CBC   Result Value Ref Range    White Blood Cell Count 14.14 (H) 4.16 - 9.95 x10E3/uL    Red Blood Cell Count 4.00 3.96 - 5.09 x10E6/uL    Hemoglobin 12.6 11.6 - 15.2 g/dL    Hematocrit 61.9 65.0 - 45.2 %    Mean Corpuscular Volume 95.0 79.3 - 98.6 fL    Mean Corpuscular Hemoglobin 31.5 26.4 - 33.4 pg    MCH Concentration 33.2 31.5 - 35.5 g/dL    Red Cell Distribution Width-SD 50.2 (H) 36.9 - 48.3 fL    Red Cell Distribution Width-CV 14.5 11.1 - 15.5 %    Platelet Count, Auto 159  143 - 398 x10E3/uL    Mean Platelet Volume 11.9 9.3 - 13.0 fL    Nucleated RBC%, automated 0.0 No Ref. Range %    Absolute Nucleated RBC Count 0.00 0.00 - 0.00 x10E3/uL     Last CMP:   Results for orders placed or performed during the hospital encounter of 04/23/24   Comprehensive Metabolic Panel   Result Value Ref Range    Sodium 137 136 - 145 mmol/L    Potassium 3.3 (L) 3.5 - 5.1 mmol/L    Chloride 105 96 - 106 mmol/L    Total CO2 23 21 - 32 mmol/L    Anion Gap 9 8 - 19 mmol/L    Glucose 89 74 - 106 mg/dL    Creatinine 8.49 (H) 0.55 - 1.30 mg/dL    Estimated GFR 34 See GFR Additional Information mL/min/1.10m2    GFR Additional Information See Comment     Urea Nitrogen 20 (H) 7 - 18 mg/dL    Calcium  8.4 (L) 8.5 - 10.1 mg/dL    Total Protein 5.8 (L) 6.4 - 8.2 g/dL    Albumin 2.5 (L) 3.4 - 5.0 g/dL    Bilirubin,Total 0.4 0.2 - 1.0 mg/dL    Alkaline Phosphatase 107 45 - 117 U/L    Aspartate Aminotransferase 12 (L) 15 - 37 U/L    Alanine Aminotransferase 17 13 - 56 U/L     Last BMP:   Results for orders placed or performed during the hospital encounter of 04/23/24   Basic Metabolic Panel   Result Value Ref Range    Sodium 133 (L) 136 - 145 mmol/L    Potassium 3.7 3.5 - 5.1 mmol/L    Chloride 103 98 - 107 mmol/L    Total CO2 21 21 - 32 mmol/L    Anion Gap 9 8 - 19 mmol/L    Glucose 149 (H) 74 - 106 mg/dL    Creatinine 8.39 (H) 0.55 - 1.30 mg/dL    Estimated GFR 32 See GFR Additional Information mL/min/1.37m2    GFR Additional Information See Comment     Urea Nitrogen 23 (H) 7 - 18 mg/dL    Calcium  8.3 (L) 8.5 - 10.1 mg/dL          Micro  Objective (click to expand/collapse)   Recent Results (from the past week)   Bacterial Culture Urine    Collection Time: 04/23/24  4:10 PM    Specimen: Clean Catch, Midstream; Urine   Result Value Ref Range    Bacterial Culture Urine >100,000 CFU/mL Escherichia coli (A)        Susceptibility    Escherichia coli - MIC (MCG/ML)*     Amoxicillin + Clavulanate 16 Intermediate      Ampicillin >=32 Resistant      Oral Cephalosporins R Resistant      Ceftriaxone  >=64 Resistant      Ciprofloxacin  >=4 Resistant      Ertapenem  <=0.12 Susceptible      Levofloxacin >=8 Resistant      Nitrofurantoin  <=16 Susceptible      Tobramycin 8 Resistant      Trimethoprim/Sulfamethoxazole <=20 Susceptible      * Nitrofurantoin  should not be used in patients with CrCl < 30 mL/min or in patients with suspected or confirmed pyelonephritis.    For complicated infections, carbapenems are preferred for these ceftriaxone  resistant pathogens.   Bacterial Culture Blood    Collection Time: 04/24/24  4:28 PM    Specimen: Peripheral Vein; Blood  Result Value Ref Range    Blood Culture - Preliminary status Negative To Date Negative Bacterial Culture Blood    Collection Time: 04/24/24  4:57 PM    Specimen: Peripheral Vein #2; Blood   Result Value Ref Range    Blood Culture - Preliminary status Negative To Date Negative          Radiology  Objective (click to expand/collapse)   No imaging has been resulted in the last 24 hours       Additional studies   Objective (click to expand/collapse)   None       Independent Review of Studies  None    I reviewed these notes that directly relate to the patient's acute and/or chronic medical problems with documentation of the salient findings if any:  Inpatient notes:   None         A&P   Juliette Standre is a 83 y.o. female admitted for generalized weakness    #Urinary tract infection  - urine culture with ESBL E coli  - switch from ceftriaxone  to ertapenem  500 mg IV q24h (renally dosed)  - f/u blood cultures, NGTD    #AKI on CKD vs CKD progression, suspect component of post obtructive  #Urinary retention  #Mild R hydronephrosis  - d/c IVF  - continue foley catheter  - repeat renal US  to ensure resolution of hydronephrosis     #Hypertensive urgency  #Labile BP  -SBP as high as 194/88 on admission  -Continue home medication amlodipine  10 mg, p.r.n. hydralazine /clonidine to maintain blood pressure <140/90     #Recurrent falls  #Ataxia  -Previous records indicate symptoms have been present for last 2-3 years; may be worsened by active UTI  -CT head, CTA head and neck were largely unremarkable, no focal neurological deficits, low suspicion for CVA.  -Consider MRI brain, neuro consult if no significant improvement.    -Fall precautions, PT/OT consult       #Hyponatremia  -sodium 127>> 133 appropriate  , continue IV fluids, trend BMP     #Dementia  #Mood disorder  -Continue quetiapine  12.5 mg bedtime, venlafaxine  300 mg daily, buspirone  15 mg TID, rivastigmine  4.5mg  BID     #Malignant metastatic NET   -Outpatient F/U with oncology          # IP Checklist  - Diet: Diet 2 gram sodium  - VTE PPX: Prophylactic Heparin   - LDA:   Urethral Catheter Latex 16 Fr. (0)  Peripheral IV 20 G Right Antecubital (3)  Peripheral IV 22 G Anterior;Distal;Left Forearm (2)    Advance Care Planning:   Full Code,  Primary Emergency Contact: [name],[name], Home Phone: [phone], Mobile Phone: [phone]        Disposition:  Expected post-hospitalization disposition will be to Home.    Author:    Anaston Koehn R. Wyona Neils  04/26/2024 2:00 PM  Pager [phone] from 5 PM - 7 AM PST for [facility] Patients

## 2024-04-26 NOTE — Other
 Patient's Clinical Goal:   Clinical Goal(s) for the Shift: No falls, V/S stable, Monitor for retention, Antibiotics for UTI  Identify possible barriers to advancing the care plan:   Stability of the patient: Moderately Stable - low risk of patient condition declining or worsening   Progression of Patient's Clinical Goal:     A&O x 1, Bedrest, turning Q2. Ongoing PT/OT eval. Bladder scan done in the morning - confirmed patient is retaining urine. MD informed. Foley catheter inserted. Husband at bedside and was updated.

## 2024-04-26 NOTE — Consults
 IP CM ACTIVE DISCHARGE PLANNING  Department of Care Coordination      Admit Ijuz:[id number]  Anticipated Date of Discharge:     Following FI:Dzmcjwn, Guilianne R., MD    Today's Short Update   Pending [facility] agency acceptance    Per MD plan for DC back to ALF with [facility] for foley care tomorrow, 04/27/24.   CM spoke to Basye at [facility] [employer].  Per Chyrl the patient is ok to return with a foley catheter.   CM spokr to the patient's daughter, [name], and updated her on DCP.  Daughter is agreeable.  Daughter states that the patient is already under [name] PT/OT services and will continue.  DC need is for [facility] nursing.     ALF--  [name]  [address]  [place], [place] [address]  Ph. [phone]      Disposition   Expected Disposition: Assisted Living Facility  Post-Acute Recommendations: 24 hr supervision, 24 hr caregiving, Rehab  Discharge Address: [address] [place], [place] [address]  Family/Support System in agreement with the current discharge plan: Yes, in agreement and participating           Multidisciplinary Team Member Plan of Care   Interdisciplinary rounds were conducted with the multidisciplinary team including the clinical social worker and nurse case manager. The patient's plan of care and discharge plan were discussed and formulated based on the patient's specific needs.    Home Health Coordination Status   Order written (2/7), Referral sent-out to providers (via [facility]) (3/7)         Santa everitt clint Cindee, BSN, RN  Case Manager  04/26/2024  3:38 PM

## 2024-04-27 ENCOUNTER — Telehealth: Payer: MEDICARE

## 2024-04-27 ENCOUNTER — Telehealth: Payer: TRICARE (CHAMPUS)

## 2024-04-27 ENCOUNTER — Other Ambulatory Visit: Payer: TRICARE (CHAMPUS)

## 2024-04-27 ENCOUNTER — Ambulatory Visit: Payer: TRICARE (CHAMPUS)

## 2024-04-27 DIAGNOSIS — R6520 Severe sepsis without septic shock: Secondary | ICD-10-CM

## 2024-04-27 DIAGNOSIS — A419 Sepsis, unspecified organism: Secondary | ICD-10-CM

## 2024-04-27 LAB — Comprehensive Metabolic Panel

## 2024-04-27 LAB — CBC: RED CELL DISTRIBUTION WIDTH-CV: 14.5 % (ref 11.1–15.5)

## 2024-04-27 LAB — Glucose,POC
GLUCOSE,POC: 109 mg/dL — ABNORMAL HIGH (ref 65–99)
GLUCOSE,POC: 111 mg/dL — ABNORMAL HIGH (ref 65–99)
GLUCOSE,POC: 112 mg/dL — ABNORMAL HIGH (ref 65–99)
GLUCOSE,POC: 93 mg/dL (ref 65–99)

## 2024-04-27 LAB — Differential Automated: IMMATURE GRANULOCYTES%: 0.9 % (ref 0.20–0.80)

## 2024-04-27 MED ADMIN — RIVASTIGMINE TARTRATE 1.5 MG PO CAPS: 4.5 mg | ORAL | Stop: 2024-04-30 | NDC 60687077711

## 2024-04-27 MED ADMIN — BUSPIRONE HCL 10 MG PO TABS: 15 mg | ORAL | @ 04:00:00 | Stop: 2024-04-30 | NDC 00904712161

## 2024-04-27 MED ADMIN — LORATADINE 10 MG PO TABS: 10 mg | ORAL | @ 15:00:00 | Stop: 2024-04-30 | NDC 58602070283

## 2024-04-27 MED ADMIN — VENLAFAXINE HCL ER 75 MG PO CP24: 300 mg | ORAL | @ 15:00:00 | Stop: 2024-04-30 | NDC 68084070911

## 2024-04-27 MED ADMIN — AMLODIPINE BESYLATE 10 MG PO TABS: 10 mg | ORAL | @ 15:00:00 | Stop: 2024-04-30 | NDC 60687049611

## 2024-04-27 MED ADMIN — ERTAPENEM 1 G IN SWFI IVP: 500 mg | INTRAVENOUS | Stop: 2024-04-30 | NDC 60505619600

## 2024-04-27 MED ADMIN — QUETIAPINE FUMARATE 25 MG PO TABS: 12.5 mg | ORAL | @ 04:00:00 | Stop: 2024-04-30 | NDC 60687032711

## 2024-04-27 MED ADMIN — CALCIUM GLUCONATE-NACL 1-0.675 GM/50ML-% IV SOLN: 1 g | INTRAVENOUS | @ 17:00:00 | Stop: 2024-04-27 | NDC 44567062001

## 2024-04-27 MED ADMIN — CALCIUM GLUCONATE-NACL 1-0.675 GM/50ML-% IV SOLN: 1 g | INTRAVENOUS | @ 15:00:00 | Stop: 2024-04-27 | NDC 44567062001

## 2024-04-27 MED ADMIN — PANTOPRAZOLE SODIUM 40 MG PO TBEC: 40 mg | ORAL | @ 15:00:00 | Stop: 2024-04-30 | NDC 60687073609

## 2024-04-27 MED ADMIN — LIDOCAINE 5 % EX PTCH: 1 | TRANSDERMAL | @ 12:00:00 | Stop: 2024-04-30

## 2024-04-27 MED ADMIN — LIDOCAINE 5 % EX PTCH: 1 | TRANSDERMAL | @ 01:00:00 | Stop: 2024-04-30 | NDC 82347050505

## 2024-04-27 MED ADMIN — RIVASTIGMINE TARTRATE 1.5 MG PO CAPS: 4.5 mg | ORAL | @ 12:00:00 | Stop: 2024-04-30 | NDC 60687077711

## 2024-04-27 MED ADMIN — HEPARIN SODIUM (PORCINE) 5000 UNIT/ML IJ SOLN: 5000 [IU] | SUBCUTANEOUS | @ 04:00:00 | Stop: 2024-04-30 | NDC 00409272330

## 2024-04-27 MED ADMIN — BUSPIRONE HCL 10 MG PO TABS: 15 mg | ORAL | @ 21:00:00 | Stop: 2024-04-30 | NDC 00904712161

## 2024-04-27 MED ADMIN — BUSPIRONE HCL 10 MG PO TABS: 15 mg | ORAL | @ 12:00:00 | Stop: 2024-04-30 | NDC 00904712161

## 2024-04-27 MED ADMIN — HEPARIN SODIUM (PORCINE) 5000 UNIT/ML IJ SOLN: 5000 [IU] | SUBCUTANEOUS | @ 15:00:00 | Stop: 2024-04-30 | NDC 00409272330

## 2024-04-27 NOTE — Other
 Patient's Clinical Goal:   Clinical Goal(s) for the Shift: V/S stable, Safety and Comfort, No falls  Identify possible barriers to advancing the care plan: none  Stability of the patient: Moderately Stable - low risk of patient condition declining or worsening   Progression of Patient's Clinical Goal: patient transferred to unit. Alert to self. Poor safety awareness. Unable to express needs. Foley in place. Repositioned.

## 2024-04-27 NOTE — Consults
 NUTRITION ASSESSMENT (Adult)    Admit Date: 04/23/2024     Date of Birth: [birth year] Gender: female MRN: [id number]     Date of Assessment: 04/27/2024   Indication:     Subjective: Spoke with pt and husband at bedside. Pt reports poor appetite, did not have any food preferences at this time. Offered boost, pt agreeable. Husband reports pt sometimes would drink ensure PTA. Provided diet education, pt verbalized understanding     Past Medical History:   Diagnosis Date    Anxiety     Cancer (HCC/RAF)     Dementia (HCC/RAF)     Depression     Histoplasmosis     Past Surgical History:   Procedure Laterality Date    ABDOMINAL SURGERY      BREAST SURGERY            Labs and Medications reviewed     Diet Info   Allergies:   Ace inhibitors, Sulfasalazine, and Sulfa antibiotics  Cultural/Ethnic/Religious/Other Food Preferences:  None     Nutrition Regimen PTA:  Regular  Current diet order:     Diets/Supplements/Feeds   Diet    Diet 2 gram sodium     Start Date/Time: 04/23/24 1830      Number of Occurrences: Until Specified     PO % consumed: 26 to 50%  Nutrition Support: none  Other caloric sources: none     Anthropometrics:   Height: 172.7 cm (5' 7.99'')  Admit Weight: 59.3 kg (130 lb 11.7 oz) (04/23/24 2059)  Current Weight: 59.3 kg (130 lb 11.7 oz)  BMI: 19.88  IBW: 63 kg/140 lbs  IBW %: 93  Adj BW (if applicable): 62 kg  UBW:    UBW %:         Weight History   Wt Readings from Last 10 Encounters:   04/23/24 59.3 kg (130 lb 11.7 oz)   04/20/24 56.7 kg (125 lb)   04/03/24 57.1 kg (125 lb 12.8 oz)   03/23/24 57.6 kg (127 lb)   03/02/24 59 kg (130 lb)   02/24/24 57.6 kg (127 lb)   02/19/24 58.5 kg (129 lb)   12/30/23 59 kg (130 lb)   12/02/23 59.5 kg (131 lb 3.2 oz)   12/02/23 59 kg (130 lb 1.1 oz)        Estimated Nutrition Needs   Based on wt: 63 kg  Calories: 25 - 30 cal/kg = 1575 - 1890 cal/day  Protein: 1 - 1.2 g pro/kg = 63 - 75.6 g pro/day     Diet Education   Teaching provided (Refer to Patient Education records) Malnutrition Assessment   Malnutrition in the Context of: Chronic   Energy Intake: Does not meet criterion  Weight Loss: Does not meet criterion    Nutrition-Focused Physical Exam: 04/27/2024  No Indication for NFPE      Patient meets criteria for: Patient does not meet criteria for Malnutrition at this time    Nutrition Assessment   Anthropometrics: BMI 19.88kg/m2, pt reports has lost 30lb (18%) over past 2 years, not significant    Nutrition and Tolerance: tolerating 2 gm sodium diet but with poor appetite    GI:   Patient is not on bowel regimen. LBM 7/7    Labs: BG very low, Glu POC slightly elevated, Cr elevated    Micronutrients:Vit B12 and Vit D WNL (03/22/23)    Skin: no pressure injuries     Recommendations / Care Plan      Continue  2 gm sodium diet  Boost VHC strawberry    Author: Marybel Alcott, RDN    04/27/2024 1:47 PM       Securechat and Pended orders for Attending  RD to monitor and follow-up per nutrition policy

## 2024-04-27 NOTE — Telephone Encounter
 Hi Dr. Servano,    I'm the Dementia Care Specialist in the [facility] for Jodi Bentley. Thank you so much for taking care of her (and her family) in the hospital.    I just spoke to her daughter Jodi Bentley. We're concerned about Jodi Bentley's bedbound status, as she was previously ambulatory (walker and staff assist). Given Jodi Bentley's occasional behavioral disturbances (anxiety and hallucinations), has she been compliant with physical therapy? Also, is she appropriate for a hospital bed?     She's also needing help with feeding (this is new). I recommended getting her additional caregiver supervision (family was agreeable).    Please let me know if I may assist in their care during the hospitalization. Thanks again.    Thank you,  Jodi Santos, DNP  Dementia Care Specialist  [facility]  Date: 04/27/2024

## 2024-04-27 NOTE — Progress Notes
 [facility]PART[facility] OF MEDIC[facility]   DIVISION OF HOSPITAL MEDICINE  PROGRESS NOTE  DATE OF SERVICE: 04/27/2024  For questions, please message the 1st Provider Contact on Epic Chat or page.  After 5 PM PST, PAGE [phone] for [facility] Patients    PATIENT: Jodi Bentley               MRN: [id number]   PCP: Lajean Colorado, Chiquita HERO., MD  ADMISSION DATE: 04/23/2024  HOSPITAL DAY: 4  DATE OF SERVICE: 04/27/2024   CC: Generalized Weakness (Biba from assisted living facility, per ems family wanted patient to be seen due to leaning to the right and having general weakness today.  1100 last known well. )    Subjective & Significant Overnight Events    Discussed with RN  No acute events  Updated daughter over the phone    Medications   Objective (click to expand/collapse)   Scheduled:  amLODIPine , 10 mg, Oral, Daily  busPIRone , 15 mg, Oral, TID  ertapenem  IV, 500 mg, IV Push, Q24H  heparin , 5,000 Units, Subcutaneous, BID  lidocaine , 1 patch, Transdermal, Q24H  loratadine , 10 mg, Oral, Daily  pantoprazole , 40 mg, Oral, Daily  QUEtiapine , 12.5 mg, Oral, QHS  rivastigmine , 4.5 mg, Oral, BID AC  venlafaxine , 300 mg, Oral, Daily    Infusions:        PRN:  acetaminophen  **OR** acetaminophen  suppository, bisacodyl, calcium  gluconate IVPB, cloNIDine, dextrose, hydrALAZINE , magnesium hydroxide, magnesium sulfate IV **OR** magnesium sulfate IV, melatonin oral/enteral/sublingual, ondansetron **OR** ondansetron injection/IVPB, potassium chloride  **OR** potassium chloride  **OR** potassium chloride  **OR** potassium chloride , senna      Physical Exam     Vital Signs:   Temp:  [36.2 ?C (97.2 ?F)-36.6 ?C (97.9 ?F)] 36.6 ?C (97.9 ?F)  Heart Rate:  [70-86] 72  Resp:  [17-18] 17  BP: (124-155)/(57-73) 124/73  NBP Mean:  [88-95] 90  SpO2:  [93 %-96 %] 93 %       I&O's:   I/O last 2 completed shifts:  In: 725 [P.O.:700; Other:20; IV Piggyback:5]  Out: 1975 [Urine:1975] Oxygen Support:  Oxygen Therapy  SpO2: 93 %  O2 Device: None (Room air)           Weight for the past 720 hrs (Last 4 readings):   Weight   04/23/24 2059 59.3 kg (130 lb 11.7 oz)     General appearance: alert, appears stated age, and cooperative  Lungs: clear to auscultation bilaterally  Heart: regular rate and rhythm, S1, S2 normal, no murmur, click, rub or gallop  Abdomen: soft, non-tender; bowel sounds normal; no masses,  no organomegaly  Extremities: extremities normal, atraumatic, no cyanosis or edema  Skin: Skin color, texture, turgor normal. No rashes or lesions  Neurologic: Grossly normal     Labs/Studies     Labs  Objective (click to expand/collapse)   Last CBC:   Results for orders placed or performed during the hospital encounter of 04/23/24   CBC   Result Value Ref Range    White Blood Cell Count 10.32 (H) 4.16 - 9.95 x10E3/uL    Red Blood Cell Count 3.60 (L) 3.96 - 5.09 x10E6/uL    Hemoglobin 11.2 (L) 11.6 - 15.2 g/dL    Hematocrit 66.7 (L) 34.9 - 45.2 %    Mean Corpuscular Volume 92.2 79.3 - 98.6 fL    Mean Corpuscular Hemoglobin 31.1 26.4 - 33.4 pg    MCH Concentration 33.7 31.5 - 35.5 g/dL    Red Cell Distribution Width-SD 49.0 (H)  36.9 - 48.3 fL    Red Cell Distribution Width-CV 14.5 11.1 - 15.5 %    Platelet Count, Auto 170 143 - 398 x10E3/uL    Mean Platelet Volume 11.9 9.3 - 13.0 fL    Nucleated RBC%, automated 0.0 No Ref. Range %    Absolute Nucleated RBC Count 0.00 0.00 - 0.00 x10E3/uL   Differential, Automated   Result Value Ref Range    Neutrophil Percent, Auto 74.8 No Ref. Range %    Lymphocyte Percent, Auto 11.9 No Ref. Range %    Monocyte Percent, Auto 8.5 No Ref. Range %    Eosinophil Percent, Auto 3.4 No Ref. Range %    Basophil Percent, Auto 0.5 No Ref. Range %    Immature Granulocytes% 0.9 No Reference Range %    Absolute Neut Count 7.72 (H) 1.80 - 6.90 x10E3/uL    Absolute Lymphocyte Count 1.23 (L) 1.30 - 3.40 x10E3/uL    Absolute Mono Count 0.88 (H) 0.20 - 0.80 x10E3/uL    Absolute Eos Count 0.35 0.00 - 0.50 x10E3/uL    Absolute Baso Count 0.05 0.00 - 0.10 x10E3/uL Absolute Immature Gran Count 0.09 (H) 0.00 - 0.04 x10E3/uL   CBC & Auto Differential    Narrative    The following orders were created for panel order CBC & Auto Differential.  Procedure                               Abnormality         Status                     ---------                               -----------         ------                     RAR[[id number]]                          Abnormal            Final result               Differential, Automated[[id number]]      Abnormal            Final result                 Please view results for these tests on the individual orders.      Results for orders placed or performed during the hospital encounter of 04/23/24   CBC   Result Value Ref Range    White Blood Cell Count 14.14 (H) 4.16 - 9.95 x10E3/uL    Red Blood Cell Count 4.00 3.96 - 5.09 x10E6/uL    Hemoglobin 12.6 11.6 - 15.2 g/dL    Hematocrit 61.9 65.0 - 45.2 %    Mean Corpuscular Volume 95.0 79.3 - 98.6 fL    Mean Corpuscular Hemoglobin 31.5 26.4 - 33.4 pg    MCH Concentration 33.2 31.5 - 35.5 g/dL    Red Cell Distribution Width-SD 50.2 (H) 36.9 - 48.3 fL    Red Cell Distribution Width-CV 14.5 11.1 - 15.5 %    Platelet Count, Auto 159 143 - 398 x10E3/uL    Mean Platelet Volume 11.9 9.3 - 13.0  fL    Nucleated RBC%, automated 0.0 No Ref. Range %    Absolute Nucleated RBC Count 0.00 0.00 - 0.00 x10E3/uL     Last CMP:   Results for orders placed or performed during the hospital encounter of 04/23/24   Comprehensive Metabolic Panel   Result Value Ref Range    Sodium 140 136 - 145 mmol/L    Potassium 3.6 3.5 - 5.1 mmol/L    Chloride 106 96 - 106 mmol/L    Total CO2 15 (L) 21 - 32 mmol/L    Anion Gap 19 8 - 19 mmol/L    Glucose 49 (LL) 74 - 106 mg/dL    Creatinine 8.49 (H) 0.55 - 1.30 mg/dL    Estimated GFR 34 See GFR Additional Information mL/min/1.49m2    GFR Additional Information See Comment     Urea Nitrogen 9 7 - 18 mg/dL    Calcium  5.5 (LL) 8.5 - 10.1 mg/dL    Total Protein 5.7 (L) 6.4 - 8.2 g/dL    Albumin 2.0 (L) 3.4 - 5.0 g/dL    Bilirubin,Total 0.3 0.2 - 1.0 mg/dL    Alkaline Phosphatase 127 (H) 45 - 117 U/L    Aspartate Aminotransferase 15 15 - 37 U/L    Alanine Aminotransferase 17 13 - 56 U/L     Last BMP:   Results for orders placed or performed during the hospital encounter of 04/23/24   Basic Metabolic Panel   Result Value Ref Range    Sodium 133 (L) 136 - 145 mmol/L    Potassium 3.7 3.5 - 5.1 mmol/L    Chloride 103 98 - 107 mmol/L    Total CO2 21 21 - 32 mmol/L    Anion Gap 9 8 - 19 mmol/L    Glucose 149 (H) 74 - 106 mg/dL    Creatinine 8.39 (H) 0.55 - 1.30 mg/dL    Estimated GFR 32 See GFR Additional Information mL/min/1.27m2    GFR Additional Information See Comment     Urea Nitrogen 23 (H) 7 - 18 mg/dL    Calcium  8.3 (L) 8.5 - 10.1 mg/dL          Micro  Objective (click to expand/collapse)   Recent Results (from the past week)   Bacterial Culture Urine    Collection Time: 04/23/24  4:10 PM    Specimen: Clean Catch, Midstream; Urine   Result Value Ref Range    Bacterial Culture Urine >100,000 CFU/mL Escherichia coli (A)        Susceptibility    Escherichia coli - MIC (MCG/ML)*     Amoxicillin + Clavulanate 16 Intermediate      Ampicillin >=32 Resistant      Oral Cephalosporins R Resistant      Ceftriaxone  >=64 Resistant      Ciprofloxacin  >=4 Resistant      Ertapenem  <=0.12 Susceptible      Levofloxacin >=8 Resistant      Nitrofurantoin  <=16 Susceptible      Tobramycin 8 Resistant      Trimethoprim/Sulfamethoxazole <=20 Susceptible      * Nitrofurantoin  should not be used in patients with CrCl < 30 mL/min or in patients with suspected or confirmed pyelonephritis.    For complicated infections, carbapenems are preferred for these ceftriaxone  resistant pathogens.   Bacterial Culture Blood    Collection Time: 04/24/24  4:28 PM    Specimen: Peripheral Vein; Blood   Result Value Ref Range    Blood Culture - Preliminary status Negative  To Date Negative   Bacterial Culture Blood    Collection Time: 04/24/24  4:57 PM    Specimen: Peripheral Vein #2; Blood   Result Value Ref Range    Blood Culture - Preliminary status Negative To Date Negative          Radiology  Objective (click to expand/collapse)   US  kidney non-vascular bilat (bladder images included)  Result Date: 04/27/2024  US  KIDNEY BILAT CLINICAL HISTORY: hydronephrosis COMPARISON: Renal ultrasound, 04/23/2024 TECHNIQUE: Real time grayscale and color Doppler imaging of the kidneys and urinary bladder was performed. FINDINGS: Right kidney: Length = cm. Cortical thickness: Normal. Echogenicity: Normal. Collecting system: Mild hydronephrosis, improved. Other findings: None. Left kidney: Length = cm. Cortical thickness: Normal. Echogenicity: Normal. Collecting system: No hydronephrosis. Other findings: None. Urinary bladder: Decompressed with a Foley catheter.     IMPRESSION: 1. Mild right hydronephrosis, which appears decreased compared to prior exam dated 04/23/2024. No left hydronephrosis. The bilateral kidneys are otherwise unremarkable. 2. Bladder appears completely decompressed with Foley catheter, limiting evaluation. Signed by: Fonda Ashworth   04/27/2024 10:50 AM         Additional studies   Objective (click to expand/collapse)   None       Independent Review of Studies  None    I reviewed these notes that directly relate to the patient's acute and/or chronic medical problems with documentation of the salient findings if any:  Inpatient notes:   None         A&P   Roux Brandy is a 83 y.o. female admitted for generalized weakness    #Severe sepsis - leukocytosis, tachy, AKI  #Urinary tract infection  - urine culture with ESBL E coli  - switch from ceftriaxone  to ertapenem  500 mg IV q24h (renally dosed). Plan for 5 day course 04/29/24  - f/u blood cultures, NGTD    #AKI on CKD vs CKD progression, suspect component of post obtructive  #Urinary retention  #Mild R hydronephrosis  - continue foley catheter  - repeat renal US  with improvement in R hydro  - outpatient renal US  to reassess     #Hypertensive urgency  #Labile BP  - SBP as high as 194/88 on admission  - Continue home medication amlodipine  10 mg, p.r.n. hydralazine /clonidine to maintain blood pressure <140/90     #Recurrent falls  #Ataxia  -Previous records indicate symptoms have been present for last 2-3 years; may be worsened by active UTI  -CT head, CTA head and neck were largely unremarkable, no focal neurological deficits, low suspicion for CVA.  -Consider MRI brain, neuro consult if no significant improvement.    -Fall precautions, PT/OT consult       #Hyponatremia  -sodium 127>> 133 appropriate  , continue IV fluids, trend BMP     #Dementia  #Mood disorder  -Continue quetiapine  12.5 mg bedtime, venlafaxine  300 mg daily, buspirone  15 mg TID, rivastigmine  4.5mg  BID     #Malignant metastatic NET   -Outpatient F/U with oncology          # IP Checklist  - Diet: Diet 2 gram sodium  Oral nutrition supplements Breakfast, Lunch, Dinner; Boost VHC Strawberry  - VTE PPX: Prophylactic Heparin   - LDA:   Urethral Catheter Latex 16 Fr. (1)  Peripheral IV 20 G Right Antecubital (4)  Peripheral IV 22 G Anterior;Distal;Left Forearm (3)    Advance Care Planning:   Full Code,  Primary Emergency Contact: [name],[name], Home Phone: [phone], Mobile Phone: [phone]  Disposition:  Expected post-hospitalization disposition will be to Home.    Author:    Petro Talent R. [place]  04/27/2024 2:02 PM  Pager [phone] from 5 PM - 7 AM PST for [facility] Patients

## 2024-04-27 NOTE — Telephone Encounter
 [facility]  Telephone Call  Time Start: 1400  Time End: 1451    Date:  04/27/2024    Name: Jodi Bentley  MRN: [id number]  DOB: 05/23/1941  Sex: female  Phone Number: [phone] (home)   Mailing address: [address] [place] [address]    Caregiver/Decision maker:   Name/Relationship: Dance movement psychotherapist [phone]    Primary Care Physician: Lajean Colorado, Chiquita HERO., MD.     Reason for contact: follow up on current hospitalization    PMH:    Past Medical History:   Diagnosis Date    Anxiety     Cancer (HCC/RAF)     Dementia (HCC/RAF)     Depression     Histoplasmosis        Medication List:    Facility-Administered Encounter Medications as of 04/27/2024   Medication Dose Route Frequency Provider Last Rate Last Admin    acetaminophen  tab 650 mg  650 mg Oral Q6H PRN Trudy Aureliano SQUIBB., MD   650 mg at 04/25/24 0848    Or    acetaminophen  650 mg supp 650 mg  650 mg Rectal Q6H PRN Trudy Aureliano SQUIBB., MD        amLODIPine  tab 10 mg  10 mg Oral Daily Trudy Aureliano SQUIBB., MD   10 mg at 04/27/24 0801    bisacodyl EC tab 5 mg  5 mg Oral Daily PRN Trudy Aureliano SQUIBB., MD        busPIRone  tab 15 mg  15 mg Oral TID Trudy Aureliano SQUIBB., MD   15 mg at 04/27/24 1331    [COMPLETED] calcium  gluconate 1 g in sodium chloride  50 mL IVPB RTU  1 g Intravenous Q1H Helgeson, My Tien Thi, PharmD 50 mL/hr at 04/27/24 0932 1 g at 04/27/24 0932    calcium  gluconate 2 g in sodium chloride  100 mL IVPB RTU  2 g Intravenous PRN Alghussein, Mohammad S., MD        [COMPLETED] cefTRIAXone  1 g in sterile water PF 10 mL IV injection  1 g IV Push STAT Lucienne Aureliano, MD   1 g at 04/23/24 1747    cloNIDine tab 0.1 mg  0.1 mg Oral Q8H PRN Trudy Aureliano SQUIBB., MD        dextrose 50% inj 25 g  25 g IV Push PRN Vangala, Srija, MD        ertapenem  500 mg in sterile water PF 5 mL IV injection  500 mg IV Push Q24H Servano, Guilianne R., MD   500 mg at 04/26/24 1639    heparin  5000 unit/mL inj 5,000 Units  5,000 Units Subcutaneous BID Servano, Guilianne R., MD   5,000 Units at 04/27/24 0801    hydrALAZINE  20 mg/mL inj 2.5 mg  2.5 mg IV Push Q6H PRN Alghussein, Mohammad S., MD   2.5 mg at 04/24/24 1316    [COMPLETED] iohexol  (Omnipaque ) 350 mg/mL inj 75 mL  75 mL Intravenous Once Lucienne Aureliano, MD   75 mL at 04/23/24 1549    lidocaine  5% patch 1 patch  1 patch Transdermal Q24H Trudy Aureliano SQUIBB., MD   1 patch at 04/26/24 1743    loratadine  tab 10 mg  10 mg Oral Daily Trudy Aureliano SQUIBB., MD   10 mg at 04/27/24 0801    magnesium hydroxide 400 mg/5 mL susp 30 mL  30 mL Oral Daily PRN Trudy Aureliano SQUIBB., MD  magnesium sulfate 2 g in water for injection 50 mL RTU  2 g Intravenous PRN Alghussein, Mohammad S., MD        Or    magnesium sulfate 4 g in water for injection 100 mL RTU  4 g Intravenous PRN Alghussein, Mohammad S., MD        melatonin tab 6 mg  6 mg Oral QHS PRN Trudy Aureliano SQUIBB., MD   6 mg at 04/24/24 2036    ondansetron tab 4 mg  4 mg Oral Q8H PRN Trudy Aureliano SQUIBB., MD        Or    ondansetron 4 mg/2 mL inj 4 mg  4 mg Intravenous Q8H PRN Trudy Aureliano SQUIBB., MD        pantoprazole  DR tab 40 mg  40 mg Oral Daily Trudy Aureliano SQUIBB., MD   40 mg at 04/27/24 0801    potassium chloride  ER tab 20 mEq  20 mEq Oral PRN Alghussein, Mohammad S., MD        Or    potassium chloride  ER tab 40 mEq  40 mEq Oral PRN Alghussein, Mohammad S., MD   40 mEq at 04/24/24 9145    Or    potassium chloride  ER tab 60 mEq  60 mEq Oral PRN Alghussein, Mohammad S., MD        Or    potassium chloride  ER tab 80 mEq  80 mEq Oral PRN Alghussein, Mohammad S., MD        [COMPLETED] potassium chloride  ER tab 40 mEq  40 mEq Oral Once Servano, Guilianne R., MD   40 mEq at 04/26/24 0919    QUEtiapine  tab 12.5 mg  12.5 mg Oral QHS Trudy Aureliano SQUIBB., MD   12.5 mg at 04/26/24 2103    rivastigmine  cap 4.5 mg  4.5 mg Oral BID AC Trudy Aureliano SQUIBB., MD   4.5 mg at 04/27/24 9488    senna tab 1 tablet  1 tablet Oral QHS PRN Trudy Aureliano SQUIBB., MD        [COMPLETED] sodium chloride  0.9% IV soln bolus 1,000 mL  1,000 mL Intravenous STAT Lucienne Aureliano, MD   Stopped at 04/23/24 1634    venlafaxine  cap ER24 300 mg  300 mg Oral Daily Trudy Aureliano SQUIBB., MD   300 mg at 04/27/24 0801    [DISCONTINUED] calcium  gluconate 2 g in sodium chloride  100 mL IVPB RTU  2 g Intravenous Once Vangala, Srija, MD        [DISCONTINUED] cefTRIAXone  1 g in sterile water PF 10 mL IV injection  1 g IV Push Q24H Trudy Aureliano SQUIBB., MD   1 g at 04/24/24 1707    [DISCONTINUED] hydrALAZINE  20 mg/mL inj 10 mg  10 mg IV Push Q6H PRN Trudy Aureliano SQUIBB., MD        [DISCONTINUED] hydrALAZINE  20 mg/mL inj 5 mg  5 mg IV Push Q6H PRN Alghussein, Mohammad S., MD        [DISCONTINUED] sodium chloride  0.9% IV soln  75 mL/hr Intravenous Continuous Trudy Aureliano SQUIBB., MD   Stopped at 04/26/24 0920     Outpatient Encounter Medications as of 04/27/2024   Medication Sig Dispense Refill    acetaminophen  500 mg tablet Take 1 tablet (500 mg total) by mouth every six (6) hours as needed for Pain (mild to moderate pain).      acetaminophen  500 mg tablet Take 2 tablets (1,000 mg total) by mouth every six (6) hours as needed for Pain  or Fever (T>38.5C or 101.51F) (severe pain).      amLODIPine  10 mg tablet Take 10 mg once daily as needed for systolic blood pressure above 839 mmHg. (Patient taking differently: Take 1 tablet (10 mg total) by mouth daily as needed (SBP > 160).) 30 tablet 0    busPIRone  15 mg tablet Take 1 tablet (15 mg total) by mouth three (3) times daily. 90 tablet 1    busPIRone  15 mg tablet Take 1 tablet (15 mg total) by mouth daily as needed (anxiety/agitation).      HYDROcodone-acetaminophen  5-325 mg tablet Take 1 tablet by mouth every four (4) hours as needed for Moderate Pain (Pain Scale 4-6) or Severe Pain (Pain Scale 7-10).      lidocaine  5% patch APPLY 1 PATCH TOPICALLY DAILY (12 HOURS ON AND 12 HOURS OFF) TO LOWER BACK (Patient taking differently: Place 1 patch onto the skin daily.) 30 patch 11    loperamide 2 mg capsule Take 1 capsule (2 mg total) by mouth every six (6) hours as needed for Diarrhea.      loratadine  10 mg tablet TAKE 1 TABLET BY MOUTH DAILY DX: ALLERGIES (Patient taking differently: Take 1 tablet (10 mg total) by mouth daily.) 90 tablet 3    mineral oil-hydrophilic petrolatum (AQUAPHOR) ointment Apply 1 g topically two (2) times daily. Apply to feet      pancrelipase , Lip-Prot-Amyl, (CREON ) 36000 units DR capsule Take 1 capsule (36,000 units of lipase total) by mouth three (3) times daily with meals. 90 capsule 11    pantoprazole  40 mg DR tablet TAKE 1 TABLET BY MOUTH DAILY GIVE AT 6:30AM BEFORE BREAKFAST (DO NOT CRUSH) FOR DX: GERD (Patient taking differently: Take 1 tablet (40 mg total) by mouth every morning before breakfast.) 90 tablet 3    QUEtiapine  25 mg tablet Take 0.5 tablets (12.5 mg total) by mouth at bedtime. 45 tablet 3    rivastigmine  4.5 mg capsule Take 1 capsule (4.5 mg total) by mouth two (2) times daily before meals. 180 capsule 1    venlafaxine  150 mg 24 hr capsule Take 2 capsules (300 mg total) by mouth daily. 180 capsule 1    vitamin D, cholecalciferol, 25 mcg (1000 units) tablet TAKE 1 TABLET BY MOUTH DAILY FOR DX: SUPPLEMENT (Patient taking differently: Take 1 tablet (25 mcg total) by mouth daily.) 90 tablet 3    [DISCONTINUED] bisacodyl (DULCOLAX) 5 mg EC tablet Take 1 tablet (5 mg total) by mouth daily as needed for Constipation.      [DISCONTINUED] Cholecalciferol (VITAMIN D3) 10 mcg (400 units) CAPS Take by mouth daily.          Interim:   Telephone call to daughter Jodi Bentley [phone]  Pt brought into [facility], found to have UTI and sepsis.    No behavioral issues.    Jodi Bentley reports physical decline. PT eval completed in hospital, unclear if pt has been able to get out of bed.  Plan to be discharged after completing antibiotics    A/P:   Major Neurocognitive disorder  Plan: Complete IV antibiotics and discharge home to [name] (on-site PT and OT)  Recommended for family to get addl private caregiver Jodi Bentley agreeable)  Will send message to Dr. Blanchie re poss hospital bed orders    Follow-up:   This week    Rosaline Santos, DNP  Dementia Care Specialist  [facility]  04/27/2024    [facility]  Green  []      Yellow  []      Red  []          I have spent 51 minutes to review patient?s medical records and/or care coordination related to dementia after direct patient care.     []   Dementia workup (including TSH, Vitamin B12, RPR, brain imaging)   [x]   Neurology, Psychiatry, Neuropsychology consultation note, progress notes, H&P, PCP notes   [x]  ER/hospitalization/NPI admissions/SNF stay   []  Advance Directive/POLST    [x]  Family telephone meeting    [x]  Consultation with other providers

## 2024-04-27 NOTE — Other
 Patient's Clinical Goal:   Clinical Goal(s) for the Shift: V/S stable, Safety and Comfort, No falls  Identify possible barriers to advancing the care plan: Confusion  Stability of the patient: Moderately Stable - low risk of patient condition declining or worsening   Progression of Patient's Clinical Goal:     A&O x -0-1, Bedrest, turning Q2.  M/S status, no tele monitoring. On room air. Reorientation provided as needed. [place]: 5.5 today, replaced. Spoke to MD and patient needs to stay to finish course of antibiotics until 04/29/24. Husband at bedside and was updated. Transferred to 5th floor (555-2), report given to [employer]. V/S stable, due medications given. Foley catheter emptied before transfer - . All belongings in the room transferred with patient. Daughter Isidoro) called and was informed of this transfer at 1710H.

## 2024-04-28 ENCOUNTER — Other Ambulatory Visit: Payer: TRICARE (CHAMPUS)

## 2024-04-28 LAB — Glucose,POC
GLUCOSE,POC: 134 mg/dL — ABNORMAL HIGH (ref 65–99)
GLUCOSE,POC: 84 mg/dL (ref 65–99)
GLUCOSE,POC: 84 mg/dL (ref 65–99)

## 2024-04-28 LAB — Comprehensive Metabolic Panel

## 2024-04-28 LAB — Differential Automated: LYMPHOCYTE PERCENT, AUTO: 13.8 % (ref 1.30–3.40)

## 2024-04-28 LAB — CBC: MCH CONCENTRATION: 32.9 g/dL (ref 31.5–35.5)

## 2024-04-28 MED ADMIN — BUSPIRONE HCL 10 MG PO TABS: 15 mg | ORAL | @ 20:00:00 | Stop: 2024-04-30 | NDC 00904712161

## 2024-04-28 MED ADMIN — RIVASTIGMINE TARTRATE 1.5 MG PO CAPS: 4.5 mg | ORAL | @ 13:00:00 | Stop: 2024-04-30 | NDC 60687077711

## 2024-04-28 MED ADMIN — HEPARIN SODIUM (PORCINE) 5000 UNIT/ML IJ SOLN: 5000 [IU] | SUBCUTANEOUS | @ 04:00:00 | Stop: 2024-04-30 | NDC 00409272330

## 2024-04-28 MED ADMIN — AMLODIPINE BESYLATE 10 MG PO TABS: 10 mg | ORAL | @ 16:00:00 | Stop: 2024-04-30 | NDC 60687049611

## 2024-04-28 MED ADMIN — POTASSIUM CHLORIDE CRYS ER 20 MEQ PO TBCR: 40 meq | ORAL | @ 16:00:00 | Stop: 2024-04-28 | NDC 00245531989

## 2024-04-28 MED ADMIN — HEPARIN SODIUM (PORCINE) 5000 UNIT/ML IJ SOLN: 5000 [IU] | SUBCUTANEOUS | @ 16:00:00 | Stop: 2024-04-30 | NDC 00409272330

## 2024-04-28 MED ADMIN — PANTOPRAZOLE SODIUM 40 MG PO TBEC: 40 mg | ORAL | @ 16:00:00 | Stop: 2024-04-30 | NDC 60687073609

## 2024-04-28 MED ADMIN — VENLAFAXINE HCL ER 75 MG PO CP24: 300 mg | ORAL | @ 16:00:00 | Stop: 2024-04-30 | NDC 68084070911

## 2024-04-28 MED ADMIN — LORATADINE 10 MG PO TABS: 10 mg | ORAL | @ 16:00:00 | Stop: 2024-04-30 | NDC 68001043816

## 2024-04-28 MED ADMIN — LIDOCAINE 5 % EX PTCH: 1 | TRANSDERMAL | @ 12:00:00 | Stop: 2024-04-30

## 2024-04-28 MED ADMIN — BUSPIRONE HCL 10 MG PO TABS: 15 mg | ORAL | @ 13:00:00 | Stop: 2024-04-30 | NDC 00904712161

## 2024-04-28 MED ADMIN — BUSPIRONE HCL 10 MG PO TABS: 15 mg | ORAL | @ 04:00:00 | Stop: 2024-04-30 | NDC 00904712161

## 2024-04-28 MED ADMIN — LIDOCAINE 5 % EX PTCH: 1 | TRANSDERMAL | @ 01:00:00 | Stop: 2024-04-30 | NDC 00591267911

## 2024-04-28 MED ADMIN — QUETIAPINE FUMARATE 25 MG PO TABS: 12.5 mg | ORAL | @ 04:00:00 | Stop: 2024-04-30 | NDC 60687032711

## 2024-04-28 NOTE — Progress Notes
 Occupational Therapy Treatment    PATIENT: Jodi Bentley  MRN: [id number]    Treatment Date: 04/28/2024    Patient Presentation: Position: In bed;Bed alarm on  Lines/devices Drains: HLIV    Pertinent Updates: Pt Max A donning socks with Max cues for initiation/sequencing 2/2 cog deficits and use of comp strategies ie figure 4 sitting while longsitting in bed. Pt Min-Mod bedmobility, Min for STS and functionalmobility. Pt demo P+ balance, poor posture, decreased step length and narrow BOS. Pt req Max cues for improved body mechanics when OOB. Pt demo std tolerance 4-6 min before requiring seated pacing    Precautions   Precautions: Fall risk  Orthotic: None  Current Activity Order: Activity as tolerated  Weight Bearing Status: Not Applicable  Additional Weight Bearing Status: Not Applicable    Cognition   Cognition: Exceptions to WDL or Baseline Status  Arousal/Alertness: Inconsistent responses to stimuli  Attention Span: Difficulty attending to directions  Memory: Decreased long term memory;Decreased short term memory  Orientation Level: Oriented to person  Following Commands: Follows one step commands with increased time;Follows one step commands with repetition  Problem Solving: Assistance required to identify errors made  Sequencing: Impaired  Initiation: Poor  Safety Awareness: Poor awareness of safety precautions;Decreased awareness of need for assistance;Impulsive  Barriers to Learning: Cognitive Limitations;Altered Mental Status    Bed Mobility   Supine to Sit: Moderate Assist  Sit to Supine: Moderate Assist    Functional Transfers   Sit to Stand: Minimum Assist  Functional Mobility: Minimum Assist    Activities of Daily Living (ADLs)   LB Dressing Assistance: Maximum Assist    AM-PAC   AM-PAC Daily Activity Raw Score: 12  AM-PAC Daily Activity t-Scale Score: 30.6  AM-PAC Daily Activity CMS 0-100% Score: 66.57 %  AM-PAC Daily Activity CMS 'G Code' Modifier: CL    Pain Assessment   Patient complains of pain: No    Patient Status   Activity Tolerance: Fair  Oxygen Needs: Room Air  Response to Treatment: Tolerated treatment well;Fatigued;with activity  Compliance with Precautions: Poor  Call light in reach: Yes  Presentation post treatment: In bed;Side rails up;Bed alarm on;SCD's    Interdisciplinary Communication   Interdisciplinary Communication: Nurse;Physical Therapist    Treatment Plan   Continue OT Treatment Plan with Focus on: ADL training;Functional mobility training;Patient/family/caregiver education and training;Therapeutic exercise;Discharge planning    OT Recommendations   Discharge Recommendation: Occupational Therapy;Would benefit from continued therapy  Discharge concerns: Impaired cognition;Requires assistance for mobility;Requires assistance for self care  Discharge Equipment Recommended: Defer to discharge facility    Treatment Completed by: Larna Anthon Roys, OT

## 2024-04-28 NOTE — Progress Notes
 [place] DEPARTMENT OF MEDICINE   DIVISION OF HOSPITAL MEDICINE  PROGRESS NOTE  DATE OF SERVICE: 04/28/2024  For questions, please message the 1st Provider Contact on Epic Chat or page.  After 5 PM PST, PAGE [phone] for [facility] Patients    PATIENT: Jodi Bentley               MRN: [id number]   PCP: Lajean Colorado, Chiquita HERO., MD  ADMISSION DATE: 04/23/2024  HOSPITAL DAY: 5  DATE OF SERVICE: 04/28/2024   CC: Generalized Weakness ([facility] from assisted living facility, per ems family wanted patient to be seen due to leaning to the right and having general weakness today.  1100 last known well. )    Subjective & Significant Overnight Events    No acute events  Discussed with daughter at bedside    Medications   Objective (click to expand/collapse)   Scheduled:  amLODIPine , 10 mg, Oral, Daily  busPIRone , 15 mg, Oral, TID  ertapenem  IV, 500 mg, IV Push, Q24H  heparin , 5,000 Units, Subcutaneous, BID  lidocaine , 1 patch, Transdermal, Q24H  loratadine , 10 mg, Oral, Daily  pantoprazole , 40 mg, Oral, Daily  QUEtiapine , 12.5 mg, Oral, QHS  rivastigmine , 4.5 mg, Oral, BID AC  venlafaxine , 300 mg, Oral, Daily    Infusions:        PRN:  acetaminophen  **OR** acetaminophen  suppository, bisacodyl, calcium  gluconate IVPB, cloNIDine, dextrose, hydrALAZINE , magnesium hydroxide, magnesium sulfate IV **OR** magnesium sulfate IV, melatonin oral/enteral/sublingual, ondansetron **OR** ondansetron injection/IVPB, potassium chloride  **OR** potassium chloride  **OR** potassium chloride  **OR** potassium chloride , senna      Physical Exam     Vital Signs:   Temp:  [36.4 ?C (97.5 ?F)-36.7 ?C (98.1 ?F)] 36.7 ?C (98.1 ?F)  Heart Rate:  [65-77] 76  Resp:  [17-18] 18  BP: (129-155)/(66-75) 129/66  NBP Mean:  [87-98] 87  SpO2:  [95 %-98 %] 98 %       I&O's:   I/O last 2 completed shifts:  In: 650 [P.O.:550; IV Piggyback:100]  Out: 1500 [Urine:1500] Oxygen Support:  Oxygen Therapy  SpO2: 98 %  O2 Device: None (Room air)           Weight for the past 720 hrs (Last 4 readings):   Weight   04/23/24 2059 59.3 kg (130 lb 11.7 oz)     General appearance: alert, appears stated age, and cooperative  Lungs: clear to auscultation bilaterally  Heart: regular rate and rhythm, S1, S2 normal, no murmur, click, rub or gallop  Abdomen: soft, non-tender; bowel sounds normal; no masses,  no organomegaly  Extremities: extremities normal, atraumatic, no cyanosis or edema  Skin: Skin color, texture, turgor normal. No rashes or lesions  Neurologic: Grossly normal     Labs/Studies     Labs  Objective (click to expand/collapse)   Last CBC:   Results for orders placed or performed during the hospital encounter of 04/23/24   CBC   Result Value Ref Range    White Blood Cell Count 11.01 (H) 4.16 - 9.95 x10E3/uL    Red Blood Cell Count 3.72 (L) 3.96 - 5.09 x10E6/uL    Hemoglobin 11.4 (L) 11.6 - 15.2 g/dL    Hematocrit 65.2 (L) 34.9 - 45.2 %    Mean Corpuscular Volume 93.3 79.3 - 98.6 fL    Mean Corpuscular Hemoglobin 30.6 26.4 - 33.4 pg    MCH Concentration 32.9 31.5 - 35.5 g/dL    Red Cell Distribution Width-SD 49.2 (H) 36.9 - 48.3 fL  Red Cell Distribution Width-CV 14.4 11.1 - 15.5 %    Platelet Count, Auto 194 143 - 398 x10E3/uL    Mean Platelet Volume 11.1 9.3 - 13.0 fL    Nucleated RBC%, automated 0.0 No Ref. Range %    Absolute Nucleated RBC Count 0.00 0.00 - 0.00 x10E3/uL   Differential, Automated   Result Value Ref Range    Neutrophil Percent, Auto 73.1 No Ref. Range %    Lymphocyte Percent, Auto 13.8 No Ref. Range %    Monocyte Percent, Auto 8.4 No Ref. Range %    Eosinophil Percent, Auto 3.0 No Ref. Range %    Basophil Percent, Auto 0.8 No Ref. Range %    Immature Granulocytes% 0.9 No Reference Range %    Absolute Neut Count 8.05 (H) 1.80 - 6.90 x10E3/uL    Absolute Lymphocyte Count 1.52 1.30 - 3.40 x10E3/uL    Absolute Mono Count 0.92 (H) 0.20 - 0.80 x10E3/uL    Absolute Eos Count 0.33 0.00 - 0.50 x10E3/uL    Absolute Baso Count 0.09 0.00 - 0.10 x10E3/uL    Absolute Immature Gran Count 0.10 (H) 0.00 - 0.04 x10E3/uL   CBC & Auto Differential    Narrative    The following orders were created for panel order CBC & Auto Differential.  Procedure                               Abnormality         Status                     ---------                               -----------         ------                     RAR[[id number]]                          Abnormal            Final result               Differential, Automated[[id number]]      Abnormal            Final result                 Please view results for these tests on the individual orders.      Results for orders placed or performed during the hospital encounter of 04/23/24   CBC   Result Value Ref Range    White Blood Cell Count 14.14 (H) 4.16 - 9.95 x10E3/uL    Red Blood Cell Count 4.00 3.96 - 5.09 x10E6/uL    Hemoglobin 12.6 11.6 - 15.2 g/dL    Hematocrit 61.9 65.0 - 45.2 %    Mean Corpuscular Volume 95.0 79.3 - 98.6 fL    Mean Corpuscular Hemoglobin 31.5 26.4 - 33.4 pg    MCH Concentration 33.2 31.5 - 35.5 g/dL    Red Cell Distribution Width-SD 50.2 (H) 36.9 - 48.3 fL    Red Cell Distribution Width-CV 14.5 11.1 - 15.5 %    Platelet Count, Auto 159 143 - 398 x10E3/uL    Mean Platelet Volume 11.9 9.3 - 13.0 fL    Nucleated  RBC%, automated 0.0 No Ref. Range %    Absolute Nucleated RBC Count 0.00 0.00 - 0.00 x10E3/uL     Last CMP:   Results for orders placed or performed during the hospital encounter of 04/23/24   Comprehensive Metabolic Panel   Result Value Ref Range    Sodium 137 136 - 145 mmol/L    Potassium 3.4 (L) 3.5 - 5.1 mmol/L    Chloride 104 98 - 107 mmol/L    Total CO2 24 21 - 32 mmol/L    Anion Gap 9 8 - 19 mmol/L    Glucose 72 (L) 74 - 106 mg/dL    Creatinine 8.59 (H) 0.55 - 1.30 mg/dL    Estimated GFR 37 See GFR Additional Information mL/min/1.43m2    GFR Additional Information See Comment     Urea Nitrogen 16 7 - 18 mg/dL    Calcium  8.0 (L) 8.5 - 10.1 mg/dL    Total Protein 5.8 (L) 6.4 - 8.2 g/dL    Albumin 2.5 (L) 3.4 - 5.0 g/dL Bilirubin,Total 0.4 0.2 - 1.0 mg/dL    Alkaline Phosphatase 126 (H) 45 - 117 U/L    Aspartate Aminotransferase 16 15 - 37 U/L    Alanine Aminotransferase 17 13 - 56 U/L     Last BMP:   Results for orders placed or performed during the hospital encounter of 04/23/24   Basic Metabolic Panel   Result Value Ref Range    Sodium 133 (L) 136 - 145 mmol/L    Potassium 3.7 3.5 - 5.1 mmol/L    Chloride 103 98 - 107 mmol/L    Total CO2 21 21 - 32 mmol/L    Anion Gap 9 8 - 19 mmol/L    Glucose 149 (H) 74 - 106 mg/dL    Creatinine 8.39 (H) 0.55 - 1.30 mg/dL    Estimated GFR 32 See GFR Additional Information mL/min/1.24m2    GFR Additional Information See Comment     Urea Nitrogen 23 (H) 7 - 18 mg/dL    Calcium  8.3 (L) 8.5 - 10.1 mg/dL          Micro  Objective (click to expand/collapse)   Recent Results (from the past week)   Bacterial Culture Urine    Collection Time: 04/23/24  4:10 PM    Specimen: Clean Catch, Midstream; Urine   Result Value Ref Range    Bacterial Culture Urine >100,000 CFU/mL Escherichia coli (A)        Susceptibility    Escherichia coli - MIC (MCG/ML)*     Amoxicillin + Clavulanate 16 Intermediate      Ampicillin >=32 Resistant      Oral Cephalosporins R Resistant      Ceftriaxone  >=64 Resistant      Ciprofloxacin  >=4 Resistant      Ertapenem  <=0.12 Susceptible      Levofloxacin >=8 Resistant      Nitrofurantoin  <=16 Susceptible      Tobramycin 8 Resistant      Trimethoprim/Sulfamethoxazole <=20 Susceptible      * Nitrofurantoin  should not be used in patients with CrCl < 30 mL/min or in patients with suspected or confirmed pyelonephritis.    For complicated infections, carbapenems are preferred for these ceftriaxone  resistant pathogens.   Bacterial Culture Blood    Collection Time: 04/24/24  4:28 PM    Specimen: Peripheral Vein; Blood   Result Value Ref Range    Blood Culture - Preliminary status Negative To Date Negative   Bacterial Culture Blood  Collection Time: 04/24/24  4:57 PM    Specimen: Peripheral Vein #2; Blood   Result Value Ref Range    Blood Culture - Preliminary status Negative To Date Negative          Radiology  Objective (click to expand/collapse)   No imaging has been resulted in the last 24 hours         Additional studies   Objective (click to expand/collapse)   None       Independent Review of Studies  None    I reviewed these notes that directly relate to the patient's acute and/or chronic medical problems with documentation of the salient findings if any:  Inpatient notes:   None         A&P   Elfida Shimada is a 83 y.o. female admitted for generalized weakness    #Severe sepsis - leukocytosis, tachy, AKI  #Urinary tract infection  - urine culture with ESBL E coli  - switch from ceftriaxone  to ertapenem  500 mg IV q24h (renally dosed). Plan for 5 day course 04/29/24  - f/u blood cultures, NGTD    #AKI on CKD vs CKD progression, suspect component of post obtructive  #Urinary retention  #Mild R hydronephrosis  - continue foley catheter  - repeat renal US  with improvement in R hydro  - outpatient renal US  to reassess     #Hypertensive urgency  #Labile BP  - SBP as high as 194/88 on admission  - Continue home medication amlodipine  10 mg, p.r.n. hydralazine /clonidine to maintain blood pressure <140/90     #Recurrent falls  #Ataxia  -Previous records indicate symptoms have been present for last 2-3 years; may be worsened by active UTI  -CT head, CTA head and neck were largely unremarkable, no focal neurological deficits, low suspicion for CVA.  -Consider MRI brain, neuro consult if no significant improvement.    -Fall precautions, PT/OT consult       #Hyponatremia  -sodium 127>> 133 appropriate  , continue IV fluids, trend BMP     #Dementia  #Mood disorder  -Continue quetiapine  12.5 mg bedtime, venlafaxine  300 mg daily, buspirone  15 mg TID, rivastigmine  4.5mg  BID     #Malignant metastatic NET   -Outpatient F/U with oncology          # IP Checklist  - Diet: Diet 2 gram sodium  Oral nutrition supplements Breakfast, Lunch, Dinner; Boost VHC Strawberry  - VTE PPX: Prophylactic Heparin   - LDA:   Urethral Catheter Latex 16 Fr. (2)  Peripheral IV 20 G Right Antecubital (5)  Peripheral IV 22 G Anterior;Distal;Left Forearm (4)    Advance Care Planning:   Full Code,  Primary Emergency Contact: [name],[name], Home Phone: [phone], Mobile Phone: [phone]        Disposition:  Expected post-hospitalization disposition will be to Home.    DME orders placed this encounter   Procedures    DME Hospital bed Semi-Electric     Is this DME order for Inpatient Stay or Discharge?:   Inpatient Stay     Hospital Bed:   Semi-Electric        Author:    Jadynn Epping R. Maevyn Riordan  04/28/2024 12:15 PM  Pager [phone] from 5 PM - 7 AM PST for [facility] Patients

## 2024-04-28 NOTE — Consults
 IP CM ACTIVE DISCHARGE PLANNING  Department of Care Coordination      Admit Ijuz:[id number]  Anticipated Date of Discharge:     Following FI:Dzmcjwn, Guilianne R., MD    Today's Short Update   (P) DME      Disposition   Expected Disposition: Assisted Living Facility  Post-Acute Recommendations: 24 hr supervision, 24 hr caregiving, Rehab  Discharge Address: [address] [place], [place] [address]  Family/Support System in agreement with the current discharge plan: Yes, in agreement and participating      Patient with DME order for semi electric hospital bed. Order sent to [facility] and CM spoke to arthur [phone]) and will follow up tomorrow with regards to status.    [facility]   [address] [place], [place], [place] [address]  [phone]  Fax [phone]    4:00pm    Informed by lamar from [facility] that they would not be able to assit with this order. CM placed order with [facility] via parachute and is currently pending.

## 2024-04-28 NOTE — Progress Notes
 Physical Therapy Treatment      PATIENT: Jodi Bentley  MRN: [id number]    Treatment Date: 04/28/2024    Patient Presentation: Position: In bed;Bed alarm on  Lines/devices Drains: HLIV  B/B Devices: External cath    Pertinent Updates:    Pt found in supine position, pt agreeable to participate in PT. Pt min/mod for getting in/out of bed. Mod verbal cues to increase BOS during ambulation, pt demonstrated scissoring gait pattern. Pt required mod assist to manage FWW during turns. Pt ambulated 10' x2 with a WC follow. Pt back to bed with needs within reach. SBAR with nursing.     Precautions   Precautions: Fall risk  Orthotic: None  Current Activity Order: Activity as tolerated;with assistance;in room  Weight Bearing Status: Not Applicable  Additional Weight Bearing Status: Not Applicable    Cognition   Cognition: Within Defined Limits  Safety Awareness: Poor awareness of safety precautions  Barriers to Learning: Cognitive Limitations    Bed Mobility   Supine Scooting: Minimum Assist;Moderate Assist  Rolling: Minimum Assist;Moderate Assist;Second Person Assist  Supine to Sit: Minimum Assist;Moderate Assist;Second Person Assist  Sit to Supine: Minimum Assist;Moderate Assist;Second Person Assist    Functional Mobility   Sit to Stand: Minimum Assist;Moderate Assist;Second Person Assist  Ambulation: Moderate Assist;Second Person Assist  Ambulation Distance (Feet): 20', required mod asisst to manage FWW during turns  Gait Pattern: Unsteady;Decreased pace;Decreased stride length;Scissoring;Narrow Base of Support  Assistive Device: Front wheeled walker  Stairs: Not Performed  Stair Management Technique: No rails     Balance   Sitting - Static: Good;without UE support  Sitting - Dynamic: Good;without UE support  Standing - Static: Poor;with UE support;Requires MOD A to maintain  Standing - Dynamic: Poor;with UE support;Requires MOD A to recover        AM-PAC   AM-PAC Basic Mobility Raw Score: 14  AM-PAC Basic Mobility t-Scale Score: 35.55  AM-PAC Basic Mobility CMS 0-100% Score: 53.86 %        Pain Assessment   Patient complains of pain: No         Patient Status   Activity Tolerance: Fair  Oxygen Needs: Room Air  Response to Treatment: Tolerated treatment well;Nursing notified  Compliance with Precautions: Not Tested  Call light in reach: Yes  Presentation post treatment: In bed;Bed alarm on    Interdisciplinary Communication   Interdisciplinary Communication: Nurse;Occupational Therapist    Treatment Plan   Continue PT Treatment Plan with Focus on: Bed mobility training;Transfer training;Gait training;Therapeutic exercise;Balance training    PT Recommendations   Discharge Recommendation: Physical Therapy;Would benefit from intensive therapy upon discharge  Discharge concerns: Requires assistance for mobility;Requires assistance for self care  Discharge Equipment Recommended: Defer to discharge facility  Comments: mod intensity    Treatment Completed by: Raoul DELENA Baston, PT

## 2024-04-28 NOTE — Other
 Patient's Clinical Goal:   Clinical Goal(s) for the Shift: VSS, Safety  Identify possible barriers to advancing the care plan: n/a  Stability of the patient: Moderately Stable - low risk of patient condition declining or worsening   Progression of Patient's Clinical Goal: Patient is alert, confused. Bedrest. Vitals are checked. Turning and repositioning. Patient has a foley catheter for retention.

## 2024-04-28 NOTE — Consults
 IP CM ACTIVE DISCHARGE PLANNING  Department of Care Coordination      Admit Ijuz:[id number]  Anticipated Date of Discharge:     Following FI:Dzmcjwn, Guilianne R., MD    Today's Short Update   (P) Dispo      Disposition   Expected Disposition: Assisted Living Facility  Post-Acute Recommendations: 24 hr supervision, 24 hr caregiving, Rehab  Discharge Address: [address] [place], [place] [address]  Family/Support System in agreement with the current discharge plan: Yes, in agreement and participating        CM discussed discharge planning with patients daughter devere [phone]) per patients request. Home health list offered and opted for home health 4u. CM spoke to leida who has accepted patient in home health 4u. CM spoke to anne [phone]) from [facility] who was made aware regarding new home health. Nurse updated. MD updated.    [facility]  [address]  [place], [place] [address]  Phone: [phone]  Fax: [phone]

## 2024-04-29 ENCOUNTER — Ambulatory Visit: Payer: TRICARE (CHAMPUS)

## 2024-04-29 DIAGNOSIS — N1832 Chronic kidney disease, stage 3b (HCC/RAF): Secondary | ICD-10-CM

## 2024-04-29 LAB — Glucose,POC: GLUCOSE,POC: 163 mg/dL — ABNORMAL HIGH (ref 65–99)

## 2024-04-29 MED ADMIN — ERTAPENEM 1 G IN SWFI IVP: 500 mg | INTRAVENOUS | Stop: 2024-04-30 | NDC 60505619600

## 2024-04-29 MED ADMIN — HEPARIN SODIUM (PORCINE) 5000 UNIT/ML IJ SOLN: 5000 [IU] | SUBCUTANEOUS | @ 16:00:00 | Stop: 2024-04-30 | NDC 00409272330

## 2024-04-29 MED ADMIN — LIDOCAINE 5 % EX PTCH: 1 | TRANSDERMAL | @ 13:00:00 | Stop: 2024-04-30

## 2024-04-29 MED ADMIN — BUSPIRONE HCL 10 MG PO TABS: 15 mg | ORAL | @ 13:00:00 | Stop: 2024-04-30 | NDC 00904712161

## 2024-04-29 MED ADMIN — RIVASTIGMINE TARTRATE 1.5 MG PO CAPS: 4.5 mg | ORAL | @ 13:00:00 | Stop: 2024-04-30 | NDC 60687077711

## 2024-04-29 MED ADMIN — LIDOCAINE 5 % EX PTCH: 1 | TRANSDERMAL | @ 02:00:00 | Stop: 2024-04-30 | NDC 82347050505

## 2024-04-29 MED ADMIN — BUSPIRONE HCL 10 MG PO TABS: 15 mg | ORAL | @ 20:00:00 | Stop: 2024-04-30 | NDC 00904712161

## 2024-04-29 MED ADMIN — HEPARIN SODIUM (PORCINE) 5000 UNIT/ML IJ SOLN: 5000 [IU] | SUBCUTANEOUS | @ 05:00:00 | Stop: 2024-04-30 | NDC 00409272330

## 2024-04-29 MED ADMIN — VENLAFAXINE HCL ER 75 MG PO CP24: 300 mg | ORAL | @ 16:00:00 | Stop: 2024-04-30 | NDC 68084070911

## 2024-04-29 MED ADMIN — LORATADINE 10 MG PO TABS: 10 mg | ORAL | @ 16:00:00 | Stop: 2024-04-30 | NDC 68001043816

## 2024-04-29 MED ADMIN — RIVASTIGMINE TARTRATE 1.5 MG PO CAPS: 4.5 mg | ORAL | Stop: 2024-04-30 | NDC 60687077711

## 2024-04-29 MED ADMIN — RIVASTIGMINE TARTRATE 1.5 MG PO CAPS: 4.5 mg | ORAL | @ 23:00:00 | Stop: 2024-04-30 | NDC 60687077711

## 2024-04-29 MED ADMIN — QUETIAPINE FUMARATE 25 MG PO TABS: 12.5 mg | ORAL | @ 05:00:00 | Stop: 2024-04-30 | NDC 60687032711

## 2024-04-29 MED ADMIN — BUSPIRONE HCL 10 MG PO TABS: 15 mg | ORAL | @ 05:00:00 | Stop: 2024-04-30 | NDC 00904712161

## 2024-04-29 MED ADMIN — AMLODIPINE BESYLATE 10 MG PO TABS: 10 mg | ORAL | @ 16:00:00 | Stop: 2024-04-30 | NDC 60687049611

## 2024-04-29 MED ADMIN — ERTAPENEM 1 G IN SWFI IVP: 500 mg | INTRAVENOUS | @ 23:00:00 | Stop: 2024-04-30 | NDC 60505619600

## 2024-04-29 MED ADMIN — PANTOPRAZOLE SODIUM 40 MG PO TBEC: 40 mg | ORAL | @ 16:00:00 | Stop: 2024-04-30 | NDC 60687073609

## 2024-04-29 NOTE — Goals of Care
 Advance Care Planning   Goals of Care           Discussed goals of care with daughter [name] over the phone. Confirmed DNR, no artifical means of nutrition status per previously signed POLST. At this time daughter would like to enroll patient in hospice services. Hospitalization and clinical course discussed as well as dispo planning.    ACP 16 minutes

## 2024-04-29 NOTE — Consults
 FINAL DISCHARGE MULTIDISCIPLINARY NOTE  Department of Care Coordination      Admit Ijuz:[id number]  Anticipated Date of Discharge: 04/29/2024    Following FI:Dzmcjwn, Guilianne R., MD    Home Address:[address]  [place] [address]    DISCHARGE INFORMATION   Discharge Address: (P) [name] [name]  [place] [address]    Individual(s) notified of discharge plan:  Contact Name: (P) [name],[name]  Relationship: (P) Daughter  Contact Number(s): (P) [phone]    Is patient/family informed of discharge?: (P) Yes  Is patient/family agreeable of discharge destination?: (P) Yes  Support Systems: (P) Family  Medicare Important Message Provided: (P) Yes  Medicare Outpatient Observation Notice (MOON) Provided: (P) Not Applicable       LACE+ Risk Stratification    Total Score Risk Stratification   0-28 Minimal Risk   29-58 Moderate Risk   59-78 High Risk   79-90 Highest Risk     Readmission Score               75 Total Score    15 Urgent Admission    6 Length of Stay    3 ED Visits in Previous 6 Months    51 Charlson Score (by age & number of urgent admissions)    Criteria that do not apply:    Female Patient    Discharge Institution    Alternative Level of Care Status    Elective Admission in Previous Year          Alex Gails, RN, BSN  RN Case Manager  [facility]  O:  [phone]  F:  [phone]

## 2024-04-29 NOTE — Other
 Patient's Clinical Goal:   Clinical Goal(s) for the Shift: VSS, Safety  Identify possible barriers to advancing the care plan: none  Stability of the patient: Moderately Unstable - medium risk of patient condition declining or worsening    Progression of Patient's Clinical Goal:   NEURO: A&OX1, BEDREST  CARDIO/MS: MED SURG  RESPIRATORY: ROOM AIR  GI: LAST BM 07/09  GU: FOLEY CATHETER IN PLACE  LINES/INFUSIONS: RIGHT AC 20 G  SKIN/WOUNDS: INTACT    SIGNIFICANT EVENTS:    ALL NEEDS MET. SAFETY MAINTAINED/SAFETY ROUNDING DONE. PLAN OF CARE ONGOING

## 2024-04-29 NOTE — Discharge Summary
 HOSPITAL DISCHARGE SUMMARY    DATE OF SERVICE: 04/29/2024  ADMISSION DATE: 04/23/2024    DISCHARGE DATE: 04/29/2024    PMD:  Lajean Ulysses Chiquita CHRISTELLA., MD  [address] / [place] [address]  T: [phone]  F: [phone]    DISCHARGING FACILITY:  [facility] [address] [facility]  [address]  [place] [address]  Phone: [phone]    HOSPITAL TEAM: Medicine - General Internal  ATTENDING PHYSICIAN: Blanchie Loye SAUNDERS., MD    CC: Generalized Weakness (Biba from assisted living facility, per ems family wanted patient to be seen due to leaning to the right and having general weakness today.  1100 last known well. )    DISCHARGE DIAGNOSES:  Hospital Problems       Current Hospital Problems           POA    * (Principal) Urinary tract infection due to extended-spectrum beta lactamase (ESBL) producing Escherichia coli Yes    Acute kidney injury superimposed on CKD Yes    Hydronephrosis of right kidney Yes    Hypertensive urgency Yes    Hyponatremia Yes    Urinary retention Yes    RESOLVED: Acute cystitis without hematuria Yes      Severe sepsis  Recurrent falls  Dementia   Mood disorder  Malignant metastatic NET    DISCHARGE CONDITION: Stable    CODE STATUS: Full Code    BRIEF HISTORY OF PRESENT ILLNESS:  Patient presented with generalized weakness.    HOSPITAL COURSE:  CT head without acute pathology. UA with pyuria. Was started empirically on ceftriaxone . Urine culture grew ESBL E coli. Antibiotic was switched to ertapenem . She received 5 days total. She was noted to have urinary retention with elevated PVR as well as mild right hydronephrosis on renal US . Also had AKI Foley catheter was placed. Repeat renal US  showed improvement of right hydronephrosis. Renal function improved with IVF and bladder decompression with foley. She was discharged with foley catheter, was referred to urology and will need repeat renal US  to reassess mild right hydronephrosis as well as voiding trial as outpatient. Patient was discharged with home hospice services.         CONSULTATIONS:  CONSULT TO CASE MANAGEMENT  HOME HEALTH REQUEST  HOME HEALTH REQUEST    SIGNIFICANT STUDIES/PROCEDURES:  Recent Results (from the past 30 days)    XR chest ap portable (1 view) [[id number]] 04/23/2024  3:20 PM    Status: Normal    Impression  No pulmonary edema, pneumonia, pleural effusion or pneumothorax. Calcified granuloma right lateral lung base.  Heart size is within normal limits.  No pleural effusions. No pneumothorax.  Osteopenia and maturational changes in the spine. Left axillary surgical clips.        Signed by: Charlie Aho   04/23/2024 3:20 PM             Recent Results (from the past 30 days)    CT brain angiogram w contrast [[id number]] 04/23/2024  4:41 PM    Status: Normal  CT neck angiogram w contrast [[id number]] 04/23/2024  4:41 PM    Status: Normal    Impression  Unremarkable CT angiogram of the brain and neck. .        Signed by: Benton Beagle   04/23/2024 4:41 PM              PENDING STUDIES/EVALUATIONS:    Unresulted Labs (From admission, onward)  Start     Ordered    04/24/24 1530  Bacterial Culture Blood, Peripheral Vein #1  (Blood Culture x 2)  Once,   STAT        References:    [place] Test Directory Information    04/24/24 1522    04/24/24 1530  Bacterial Culture Blood, Peripheral Vein #2  (Blood Culture x 2)  Once,   STAT        References:    [place] Test Directory Information    04/24/24 1522                  Unresulted Micro (From admission, onward)       Start     Ordered    04/24/24 1530  Bacterial Culture Blood, Peripheral Vein #1  (Blood Culture x 2)  Once,   STAT        References:    [place] Test Directory Information    04/24/24 1522    04/24/24 1530  Bacterial Culture Blood, Peripheral Vein #2  (Blood Culture x 2)  Once,   STAT        References:    [place] Test Directory Information    04/24/24 1522                  Unresulted Imaging (From admission, onward)      None            OUTPATIENT WORK-UP NEEDED:  Renal US  to reassess mild right hydronephrosis    Future Appointments   Date Time Provider Department Center   04/29/2024  3:45 PM Lajean Ulysses Chiquita CHRISTELLA., MD CPN [facility] [place]   05/18/2024 11:30 AM Dorris, Curlee CHRISTELLA., MD HEMONCENC [facility]   07/06/2024  2:00 PM Jacquelynne Juliene PARAS., MD [facility] [place]/   09/02/2024  1:00 PM Reeta Deitra CROME., MD [facility] [place]   09/04/2024 12:00 PM EIC PCT01 PET EIC Frederik Bers        APPOINTMENTS TO BE SCHEDULED:  Our [place] staff will assist with the following appointments:  Primary Care, Lajean Ulysses, Chiquita CHRISTELLA., MD, in 1 week  Urology, in 1 week    DISPOSITION:  Home or Self Care    HOME HEALTH:  Hospice    RISK OF READMISSION:  LACE+ Risk Stratification    Total Score Risk Stratification   0-28 Minimal Risk   29-58 Moderate Risk   59-78 High Risk   79-90 Highest Risk     Readmission Score               75 Total Score    15 Urgent Admission    6 Length of Stay    3 ED Visits in Previous 6 Months    51 Charlson Score (by age & number of urgent admissions)    Criteria that do not apply:    Female Patient    Discharge Institution    Alternative Level of Care Status    Elective Admission in Previous Year              DISCHARGE PHYSICAL EXAM:  BP 160/76 (Patient Position: Lying)  ~ Pulse 68  ~ Temp 36.6 ?C (97.9 ?F) (Oral)  ~ Resp 19  ~ Ht 1.727 m (5' 8'')  ~ Wt 59.3 kg (130 lb 11.7 oz)  ~ SpO2 99%  ~ BMI 19.88 kg/m?     General: alert, no distress.  Heart: normal rate, regular rhythm, normal S1, S2, no murmurs, rubs,  clicks or gallops. Peripheral pulses: normal  Lungs: clear to auscultation, no wheezes, rales or rhonchi, symmetric air entry and normal work of breathing.  Abdomen: soft, nontender, nondistended, no masses or organomegaly  MSK: no edema  Skin: normal coloration and turgor, no rashes, no suspicious skin lesions noted.    Greater than 30 minutes was spent organizing discharge materials for the patient, coordinating post-discharge transition of care, and counseling patient on new and old medications to continue.    DISCHARGE MEDICATIONS:     Changes To My Medications        CHANGE how you take these medications        Dose Instructions Notes   amLODIPine  10 mg tablet  Commonly known as: Norvasc   What changed:   how much to take  how to take this  when to take this  reasons to take this  additional instructions    Take 10 mg once daily as needed for systolic blood pressure above 839 mmHg.      lidocaine  5% patch  Commonly known as: Lidoderm   What changed: See the new instructions.    APPLY 1 PATCH TOPICALLY DAILY (12 HOURS ON AND 12 HOURS OFF) TO LOWER BACK      loratadine  10 mg tablet  Commonly known as: Claritin   What changed: See the new instructions.    TAKE 1 TABLET BY MOUTH DAILY DX: ALLERGIES      pantoprazole  40 mg DR tablet  Commonly known as: Protonix   What changed: See the new instructions.    TAKE 1 TABLET BY MOUTH DAILY GIVE AT 6:30AM BEFORE BREAKFAST (DO NOT CRUSH) FOR DX: GERD      vitamin D (cholecalciferol) 25 mcg (1000 units) tablet  What changed: See the new instructions.    TAKE 1 TABLET BY MOUTH DAILY FOR DX: SUPPLEMENT             CONTINUE taking these medications        Dose Instructions Notes   * acetaminophen  500 mg tablet   500 mg   Take 1 tablet (500 mg total) by mouth every six (6) hours as needed for Pain (mild to moderate pain).      * acetaminophen  500 mg tablet   1,000 mg   Take 2 tablets (1,000 mg total) by mouth every six (6) hours as needed for Pain or Fever (T>38.5C or 101.42F) (severe pain).      * busPIRone  15 mg tablet  Commonly known as: Buspar    15 mg   Take 1 tablet (15 mg total) by mouth three (3) times daily.      * busPIRone  15 mg tablet  Commonly known as: Buspar    15 mg   Take 1 tablet (15 mg total) by mouth daily as needed (anxiety/agitation).      HYDROcodone-acetaminophen  5-325 mg tablet  Commonly known as: Norco   1 tablet   Take 1 tablet by mouth every four (4) hours as needed for Moderate Pain (Pain Scale 4-6) or Severe Pain (Pain Scale 7-10). loperamide 2 mg capsule  Commonly known as: Imodium   2 mg   Take 1 capsule (2 mg total) by mouth every six (6) hours as needed for Diarrhea.      mineral oil-hydrophilic petrolatum ointment   1 g   Apply 1 g topically two (2) times daily. Apply to feet      pancrelipase  (Lip-Prot-Amyl) 36000 units DR capsule  Commonly known as: CREON    36,000 units of  lipase   Take 1 capsule (36,000 units of lipase total) by mouth three (3) times daily with meals.      QUEtiapine  25 mg tablet  Commonly known as: SEROquel    12.5 mg   Take 0.5 tablets (12.5 mg total) by mouth at bedtime.      rivastigmine  4.5 mg capsule  Commonly known as: Exelon    4.5 mg   Take 1 capsule (4.5 mg total) by mouth two (2) times daily before meals.      venlafaxine  150 mg 24 hr capsule  Commonly known as: Effexor  XR   300 mg   Take 2 capsules (300 mg total) by mouth daily.            * This list has 4 medication(s) that are the same as other medications prescribed for you. Read the directions carefully, and ask your doctor or other care provider to review them with you.                  AUTHOR:  Naya Ilagan R. Rejina Odle, MD  04/29/2024 at 7:56 AM

## 2024-04-29 NOTE — Consults
 IP CM ACTIVE DISCHARGE PLANNING  Department of Care Coordination      Admit Ijuz:[id number]  Anticipated Date of Discharge: 04/29/2024    Following FI:Dzmcjwn, Guilianne R., MD    Today's Short Update   (P) Discharge TO ALF      Disposition   Expected Disposition: Assisted Living Facility  Post-Acute Recommendations: 24 hr supervision, 24 hr caregiving, Rehab  Discharge Address: [address] [place], [place] [address]  Family/Support System in agreement with the current discharge plan: Yes, in agreement and participating      Patient originally planned to discharge back to [facility] with [facility] health services , but this cm received a call at 0900 requesting to speak to md regarding hospice. MD contacted and made aware to connect with susan daughter. Nurse updated . MD updated.    1030    CM contacted [facility] sw and asked with regards to following up on hospice request. Nurse updated MD updated.    1pm    Informed by breanda that daughter has set up hospice through [facility] and alexis from hospice company has been in communication with sw. Followed up with [facility] company with regards to hospital bed delivery and window is between 3-7pm and daughter susan aware.Per susan to set up transportation at 7pm just to make sure they can deliver bed first and she would fun for this gurney transportation. Im discussed with daughter and verbal okay given. No other concerns at this time. Nurse updated. MD updated.      2:30pm    CM set up gurney transportation with [employer] per daughter request and daughter to pay.  [employer]  [phone]  Pickup time: 3:45pm    CM confirmed that hospital bed was already delivered at [facility] and hospice agency made aware andn made aware regarding pickup time from the hospital. Nurse updated.

## 2024-04-29 NOTE — Other
 Patient's Clinical Goal:   Clinical Goal(s) for the Shift: VSS, safety, fall precaution  Identify possible barriers to advancing the care plan:   Stability of the patient: Moderately Stable - low risk of patient condition declining or worsening   Progression of Patient's Clinical Goal:     Neuro: Exceptions to WDL   BMAT: Level 1 - Red  Assistive Device: Air TAP    Vitals:    Temp:  [36.4 ?C (97.5 ?F)-37.1 ?C (98.8 ?F)] 37.1 ?C (98.8 ?F)  Heart Rate:  [56-69] 63  Resp:  [17-20] 18  BP: (119-160)/(60-76) 137/70  NBP Mean:  [85-104] 93  SpO2:  [91 %-99 %] 96 %  Cardiac:     Ectopy:   Respiratory: RA           Pain:    Last BM: 04/29/24  GU: Indwelling urinary catheter  Skin: Within Defined Limits  Lines/Tubes/Drains:   Urethral Catheter Latex 16 Fr. (3)  Peripheral IV 20 G Right Antecubital (6)       Shift Events/Acute Changes:   No changes          Nursing Considerations for Discharge:  Pt will be discharged back to [facility], most likely will be on Hospice. Bed arrived at facility per husband.  Last IV abx will be given before discharge. Updated [name] and Hospice.   Medical records sent to [facility] CM via Reginald, Child psychotherapist.  All personal belongings accounted for.  Hygiene care provided.  No acute distress noted     Expected Discharge Disposition: Assisted Living Facility     Expected Discharge Date: 04/29/2024

## 2024-04-29 NOTE — Consults
 CLINICAL SOCIAL WORKER PROGRESS NOTE    Admit Date:04/23/2024    Date of CSW Assessment: 04/29/2024      REFERRAL INFORMATION   Date Seen: 04/29/24  Date Referred: 04/29/24  Time In: 1100  Time Out: 1200  Referral Source: CM, RN  Reason for Referral: Discharge Planning, Hospice/Palliative Care  Source of Information: Consultation, Review of medical record, Persons interviewed  Consult Source: RN, CM  Interview Source(s): patient's daughter & healthcare agent - [name] Bowen - [phone]  Does the patient have a Family/Support System member participating in Discharge Planning?: Yes  Family/Support System in agreement with the current discharge plan: Yes, in agreement and participating      CLINICAL ASSESSMENT     CSW acknowledged consult for hospice and contacted patient's daughter/healthcare agent [name] Bowen - [phone], who confirmed she spoke to MD and would like to move forward with having patient return to [facility] on hospice services through [facility]. CSW contacted [facility] & spoke to [place] (p: [phone] or [phone] / f: [phone]) who requested report from RN (RN was informed) & medical records (CSW faxed referral, including MD order). CM Valery confirmed hospital bed was ordered for patient on 04/28/24. Daughter requested ambulance transport as family are unable to assist patient into their vehicle - Alexis confirmed [facility] does not provide transportation upon patient discharge.    CM to contact daughter to confirm DME delivery, once medical bed is delivered transportation can be scheduled.    Interventions: Discussed role of CSW and informed CSW availability for support and resources, Gathered history, Discussed resources available, Provided referrals to, Provided follow up regarding, Coordinated care with multidisciplinary team  Outcome: Daughter [name] confirmed [facility] is preferred.  Plan/Recommendations: Discharge to Facility    Projected Date of Discharge: 04/29/2024        [facility] [place], [place],  04/29/2024

## 2024-04-29 NOTE — Progress Notes
 [facility] DEPARTMENT OF MEDICINE   DIVISION OF HOSPITAL MEDICINE  PROGRESS NOTE  DATE OF SERVICE: 04/29/2024  For questions, please message the 1st Provider Contact on Epic Chat or page.  After 5 PM PST, PAGE [phone] for [facility] Patients    PATIENT: Jodi Bentley               MRN: [id number]   PCP: Lajean Colorado, Chiquita HERO., MD  ADMISSION DATE: 04/23/2024  HOSPITAL DAY: 6  DATE OF SERVICE: 04/29/2024   CC: Generalized Weakness ([facility] from assisted living facility, per ems family wanted patient to be seen due to leaning to the right and having general weakness today.  1100 last known well. )    Subjective & Significant Overnight Events    No acute events  Discussed with daughter  over the phone  Daughter would like patient to be enrolled in hospice  GOC had, please refer to note    Medications   Objective (click to expand/collapse)   Scheduled:  amLODIPine , 10 mg, Oral, Daily  busPIRone , 15 mg, Oral, TID  ertapenem  IV, 500 mg, IV Push, Q24H  heparin , 5,000 Units, Subcutaneous, BID  lidocaine , 1 patch, Transdermal, Q24H  loratadine , 10 mg, Oral, Daily  pantoprazole , 40 mg, Oral, Daily  QUEtiapine , 12.5 mg, Oral, QHS  rivastigmine , 4.5 mg, Oral, BID AC  venlafaxine , 300 mg, Oral, Daily    Infusions:        PRN:  acetaminophen  **OR** acetaminophen  suppository, bisacodyl, calcium  gluconate IVPB, cloNIDine, dextrose, hydrALAZINE , magnesium hydroxide, magnesium sulfate IV **OR** magnesium sulfate IV, melatonin oral/enteral/sublingual, ondansetron **OR** ondansetron injection/IVPB, potassium chloride  **OR** potassium chloride  **OR** potassium chloride  **OR** potassium chloride , senna      Physical Exam     Vital Signs:   Temp:  [36.4 ?C (97.5 ?F)-36.9 ?C (98.4 ?F)] 36.6 ?C (97.9 ?F)  Heart Rate:  [56-69] 68  Resp:  [17-20] 19  BP: (119-160)/(60-76) 160/76  NBP Mean:  [85-104] 104  SpO2:  [91 %-99 %] 99 %       I&O's:   I/O last 2 completed shifts:  In: 1160 [P.O.:1160]  Out: 1400 [Urine:1400] Oxygen Support:  Oxygen Therapy  SpO2: 99 %  O2 Device: None (Room air)           Weight for the past 720 hrs (Last 4 readings):   Weight   04/23/24 2059 59.3 kg (130 lb 11.7 oz)     General appearance: alert, appears stated age, and cooperative  Lungs: clear to auscultation bilaterally  Heart: regular rate and rhythm, S1, S2 normal, no murmur, click, rub or gallop  Abdomen: soft, non-tender; bowel sounds normal; no masses,  no organomegaly  Extremities: extremities normal, atraumatic, no cyanosis or edema  Skin: Skin color, texture, turgor normal. No rashes or lesions  Neurologic: Grossly normal     Labs/Studies     Labs  Objective (click to expand/collapse)   Last CBC:   Results for orders placed or performed during the hospital encounter of 04/23/24   CBC   Result Value Ref Range    White Blood Cell Count 11.01 (H) 4.16 - 9.95 x10E3/uL    Red Blood Cell Count 3.72 (L) 3.96 - 5.09 x10E6/uL    Hemoglobin 11.4 (L) 11.6 - 15.2 g/dL    Hematocrit 65.2 (L) 34.9 - 45.2 %    Mean Corpuscular Volume 93.3 79.3 - 98.6 fL    Mean Corpuscular Hemoglobin 30.6 26.4 - 33.4 pg    MCH Concentration 32.9 31.5 -  35.5 g/dL    Red Cell Distribution Width-SD 49.2 (H) 36.9 - 48.3 fL    Red Cell Distribution Width-CV 14.4 11.1 - 15.5 %    Platelet Count, Auto 194 143 - 398 x10E3/uL    Mean Platelet Volume 11.1 9.3 - 13.0 fL    Nucleated RBC%, automated 0.0 No Ref. Range %    Absolute Nucleated RBC Count 0.00 0.00 - 0.00 x10E3/uL   Differential, Automated   Result Value Ref Range    Neutrophil Percent, Auto 73.1 No Ref. Range %    Lymphocyte Percent, Auto 13.8 No Ref. Range %    Monocyte Percent, Auto 8.4 No Ref. Range %    Eosinophil Percent, Auto 3.0 No Ref. Range %    Basophil Percent, Auto 0.8 No Ref. Range %    Immature Granulocytes% 0.9 No Reference Range %    Absolute Neut Count 8.05 (H) 1.80 - 6.90 x10E3/uL    Absolute Lymphocyte Count 1.52 1.30 - 3.40 x10E3/uL    Absolute Mono Count 0.92 (H) 0.20 - 0.80 x10E3/uL    Absolute Eos Count 0.33 0.00 - 0.50 x10E3/uL Absolute Baso Count 0.09 0.00 - 0.10 x10E3/uL    Absolute Immature Gran Count 0.10 (H) 0.00 - 0.04 x10E3/uL   CBC & Auto Differential    Narrative    The following orders were created for panel order CBC & Auto Differential.  Procedure                               Abnormality         Status                     ---------                               -----------         ------                     RAR[[id number]]                          Abnormal            Final result               Differential, Automated[[id number]]      Abnormal            Final result                 Please view results for these tests on the individual orders.      Results for orders placed or performed during the hospital encounter of 04/23/24   CBC   Result Value Ref Range    White Blood Cell Count 14.14 (H) 4.16 - 9.95 x10E3/uL    Red Blood Cell Count 4.00 3.96 - 5.09 x10E6/uL    Hemoglobin 12.6 11.6 - 15.2 g/dL    Hematocrit 61.9 65.0 - 45.2 %    Mean Corpuscular Volume 95.0 79.3 - 98.6 fL    Mean Corpuscular Hemoglobin 31.5 26.4 - 33.4 pg    MCH Concentration 33.2 31.5 - 35.5 g/dL    Red Cell Distribution Width-SD 50.2 (H) 36.9 - 48.3 fL    Red Cell Distribution Width-CV 14.5 11.1 - 15.5 %    Platelet Count, Auto 159 143 - 398 x10E3/uL  Mean Platelet Volume 11.9 9.3 - 13.0 fL    Nucleated RBC%, automated 0.0 No Ref. Range %    Absolute Nucleated RBC Count 0.00 0.00 - 0.00 x10E3/uL     Last CMP:   Results for orders placed or performed during the hospital encounter of 04/23/24   Comprehensive Metabolic Panel   Result Value Ref Range    Sodium 137 136 - 145 mmol/L    Potassium 3.4 (L) 3.5 - 5.1 mmol/L    Chloride 104 98 - 107 mmol/L    Total CO2 24 21 - 32 mmol/L    Anion Gap 9 8 - 19 mmol/L    Glucose 72 (L) 74 - 106 mg/dL    Creatinine 8.59 (H) 0.55 - 1.30 mg/dL    Estimated GFR 37 See GFR Additional Information mL/min/1.48m2    GFR Additional Information See Comment     Urea Nitrogen 16 7 - 18 mg/dL    Calcium  8.0 (L) 8.5 - 10.1 mg/dL Total Protein 5.8 (L) 6.4 - 8.2 g/dL    Albumin 2.5 (L) 3.4 - 5.0 g/dL    Bilirubin,Total 0.4 0.2 - 1.0 mg/dL    Alkaline Phosphatase 126 (H) 45 - 117 U/L    Aspartate Aminotransferase 16 15 - 37 U/L    Alanine Aminotransferase 17 13 - 56 U/L     Last BMP:   Results for orders placed or performed during the hospital encounter of 04/23/24   Basic Metabolic Panel   Result Value Ref Range    Sodium 133 (L) 136 - 145 mmol/L    Potassium 3.7 3.5 - 5.1 mmol/L    Chloride 103 98 - 107 mmol/L    Total CO2 21 21 - 32 mmol/L    Anion Gap 9 8 - 19 mmol/L    Glucose 149 (H) 74 - 106 mg/dL    Creatinine 8.39 (H) 0.55 - 1.30 mg/dL    Estimated GFR 32 See GFR Additional Information mL/min/1.67m2    GFR Additional Information See Comment     Urea Nitrogen 23 (H) 7 - 18 mg/dL    Calcium  8.3 (L) 8.5 - 10.1 mg/dL          Micro  Objective (click to expand/collapse)   Recent Results (from the past week)   Bacterial Culture Urine    Collection Time: 04/23/24  4:10 PM    Specimen: Clean Catch, Midstream; Urine   Result Value Ref Range    Bacterial Culture Urine >100,000 CFU/mL Escherichia coli (A)        Susceptibility    Escherichia coli - MIC (MCG/ML)*     Amoxicillin + Clavulanate 16 Intermediate      Ampicillin >=32 Resistant      Oral Cephalosporins R Resistant      Ceftriaxone  >=64 Resistant      Ciprofloxacin  >=4 Resistant      Ertapenem  <=0.12 Susceptible      Levofloxacin >=8 Resistant      Nitrofurantoin  <=16 Susceptible      Tobramycin 8 Resistant      Trimethoprim/Sulfamethoxazole <=20 Susceptible      * Nitrofurantoin  should not be used in patients with CrCl < 30 mL/min or in patients with suspected or confirmed pyelonephritis.    For complicated infections, carbapenems are preferred for these ceftriaxone  resistant pathogens.   Bacterial Culture Blood    Collection Time: 04/24/24  4:28 PM    Specimen: Peripheral Vein; Blood   Result Value Ref Range    Blood Culture -  Preliminary status Negative To Date Negative   Bacterial Culture Blood    Collection Time: 04/24/24  4:57 PM    Specimen: Peripheral Vein #2; Blood   Result Value Ref Range    Blood Culture - Preliminary status Negative To Date Negative          Radiology  Objective (click to expand/collapse)   No imaging has been resulted in the last 24 hours         Additional studies   Objective (click to expand/collapse)   None       Independent Review of Studies  None    I reviewed these notes that directly relate to the patient's acute and/or chronic medical problems with documentation of the salient findings if any:  Inpatient notes:   None         A&P   Ailyne Pawley is a 83 y.o. female admitted for generalized weakness    #Severe sepsis - leukocytosis, tachy, AKI  #Urinary tract infection  - urine culture with ESBL E coli  - switch from ceftriaxone  to ertapenem  500 mg IV q24h (renally dosed). Plan for 5 day course, today is last dose  - f/u blood cultures, NGTD    #AKI on CKD vs CKD progression, suspect component of post obtructive  #Urinary retention  #Mild R hydronephrosis  - continue foley catheter  - repeat renal US  with improvement in R hydro  - outpatient renal US  to reassess     #Hypertensive urgency  #Labile BP  - SBP as high as 194/88 on admission  - Continue home medication amlodipine  10 mg, p.r.n. hydralazine /clonidine to maintain blood pressure <140/90     #Recurrent falls  #Ataxia  -Previous records indicate symptoms have been present for last 2-3 years; may be worsened by active UTI  -CT head, CTA head and neck were largely unremarkable, no focal neurological deficits, low suspicion for CVA.  -Consider MRI brain, neuro consult if no significant improvement.    -Fall precautions, PT/OT consult       #Hyponatremia  -sodium 127>> 133 appropriate  , continue IV fluids, trend BMP     #Dementia  #Mood disorder  -Continue quetiapine  12.5 mg bedtime, venlafaxine  300 mg daily, buspirone  15 mg TID, rivastigmine  4.5mg  BID     #Malignant metastatic NET   -Outpatient F/U with oncology          # IP Checklist  - Diet: Diet 2 gram sodium  Oral nutrition supplements Breakfast, Lunch, Dinner; Boost VHC Strawberry  Regular  - VTE PPX: Prophylactic Heparin   - LDA:   Urethral Catheter Latex 16 Fr. (3)  Peripheral IV 20 G Right Antecubital (6)    Advance Care Planning:   DNR (No CPR, defibrillation, intubation),  Primary Emergency Contact: [name],[name], Home Phone: [phone], Mobile Phone: [phone]        Disposition:  Expected post-hospitalization disposition will be to Home with hospice    DME orders placed this encounter   Procedures    DME Hospital bed Semi-Electric     Is this DME order for Inpatient Stay or Discharge?:   Inpatient Stay     Hospital Bed:   Semi-Electric    Semi Electric Bed Package        Author:    Nour Rodrigues R. Sheria Rosello  04/29/2024 12:35 PM  Pager [phone] from 5 PM - 7 AM PST for [facility] Patients

## 2024-04-29 NOTE — Progress Notes
 Patient Consent to Telehealth   The patient agreed to participate in the video visit prior to joining the visit.      Hospital discharge facility: [facility]  Admission date: 04/23/24  Discharge date: 04/29/24  Discharge diagnosis:   * (Principal) Urinary tract infection due to extended-spectrum beta lactamase (ESBL) producing Escherichia coli Yes      Acute kidney injury superimposed on CKD Yes     Hydronephrosis of right kidney Yes     Hypertensive urgency Yes     Hyponatremia Yes     Urinary retention Yes     RESOLVED: Acute cystitis without hematuria    Date of interactive contact with patient: 04/28/24  Date of face-to-face physician visit: 04/29/2024    [facility]   Progress Note    CC: hospital discharge     S: Lorrayne Ismael is a 83 y.o. female with dementia, depression, anxiety metastatic NET, HTN. Her daughter, son, and daughter-in-law present for hospital discharge follow up.     The week PTA had multiple falls. She was evaluated at [facility] ED 04/18/24 - negative CT head and negative UA.     On the day of admission to [facility], she was found leaning to the right, admitted for generalized weakness/stroke rule out.     Per hospital DC summary:  ''CT head without acute pathology. UA with pyuria. Was started empirically on ceftriaxone . Urine culture grew ESBL E coli. Antibiotic was switched to ertapenem . She received 5 days total. She was noted to have urinary retention with elevated PVR as well as mild right hydronephrosis on renal US . Also had AKI Foley catheter was placed. Repeat renal US  showed improvement of right hydronephrosis. Renal function improved with IVF and bladder decompression with foley. She was discharged with foley catheter, was referred to urology and will need repeat renal US  to reassess mild right hydronephrosis as well as voiding trial as outpatient. Patient was discharged with home hospice services.''    Now, not walking or feeding herself. Family has acquired a hospital bed and will pay for PT and caregiver out of pocket. They are planning to use hospice for her catheter care, then will disenroll her.     Needs urology appointment scheduled for 1-2 weeks. Repeat renal ultrasound and labs.    Past Medical History:   Diagnosis Date    Anxiety     Cancer (HCC/RAF)     Dementia (HCC/RAF)     Depression     Histoplasmosis        ROS  Negative except as above    No outpatient medications have been marked as taking for the 04/29/24 encounter (Telemedicine) with Lajean Colorado, Chiquita HERO., MD.       O: There were no vitals taken for this visit.  Physical Exam  None, in transit home to ALF from hospital    A/P: Clessie Karras is a 83 y.o. female with dementia, depression, anxiety metastatic NET, HTN. Her daughter, son, and daughter-in-law present for hospital discharge follow up.     Diagnoses and all orders for this visit:    Hospital discharge follow-up  -     Comprehensive Metabolic Panel; Future  -     CBC & Auto Differential; Future    Urinary retention with incomplete bladder emptying - unclear cause  -     Referral to Urology, Adult Urology  - consider d/c buspirone , loratadine     Hydronephrosis of right kidney  -  Referral to Urology, Adult Urology  -     US  kidney non-vascular bilat (bladder images included); Future    Acute kidney injury superimposed on chronic kidney disease  -     Comprehensive Metabolic Panel; Future    Chronic kidney disease, stage 3b (HCC/RAF)  -     Comprehensive Metabolic Panel; Future    Physical deconditioning  - agree with PT and 1:1 caregiver  - may need OT      F/u with urology 1-2 weeks    The above plan of care, diagnosis, orders, and follow-up were discussed with the patient/caregiver. All questions related to this recommended plan of care were answered.    Hospital records reviewed today including: admitting history and physical, discharge summary, diagnostic studies, imaging studies and home medication reconciliation.     Chiquita Lajean Colorado, MD  Family Medicine      CPT Code 00504 met requiring:  Communication (direct contact, telephone, electronic) with the patient and/or caregiver within 2 business days of discharge.  Medical decision making of at least moderate complexity during the service period  Face to face visit with the patient within 14 calendar days of discharge (which should include obtaining and reviewing the discharge summary, medication reconciliation, etc.)      CPT Code 00503 met requiring:  Communication (direct contact, telephone, electronic) with the patient and/or caregiver within 2 business days of discharge  Medical decision making of high complexity during the service period  Face to face visit within 7 calendar days of discharge (which should include obtaining and reviewing the discharge summary, medication reconciliation, etc.)

## 2024-04-30 ENCOUNTER — Other Ambulatory Visit: Payer: TRICARE (CHAMPUS)

## 2024-04-30 DIAGNOSIS — Z09 Encounter for follow-up examination after completed treatment for conditions other than malignant neoplasm: Principal | ICD-10-CM

## 2024-04-30 DIAGNOSIS — R339 Retention of urine, unspecified: Secondary | ICD-10-CM

## 2024-04-30 DIAGNOSIS — R5381 Other malaise: Secondary | ICD-10-CM

## 2024-04-30 DIAGNOSIS — N189 Chronic kidney disease, unspecified: Secondary | ICD-10-CM

## 2024-04-30 DIAGNOSIS — N179 Acute kidney failure, unspecified: Secondary | ICD-10-CM

## 2024-04-30 LAB — Blood Culture Detection
BLOOD CULTURE FINAL STATUS: NEGATIVE
BLOOD CULTURE FINAL STATUS: NEGATIVE

## 2024-05-01 ENCOUNTER — Telehealth: Payer: MEDICARE

## 2024-05-01 NOTE — Telephone Encounter
 Discharge Followup Appointment Info      Date of Discharge: 04/29/2024    Discharge Disposition: Hospice Care at Home   TIme Frame: Within 7 Days    Sched with PCP?: Patient's  PCP PCP Name: Lajean Colorado, Chiquita HERO., MD   Date and Time of appt: 04/29/24  3:45 PM PDT    Was Appt Confirmed?: Pos    Method of Appt Confirmation: Confirmed via phone directly with patient/family    Barriers to Scheduling: None    Did you schedule any Specialty visits?: Yes    Additional Appts/Tests/Info Urology- Pending I called [phone] spoke to [facility] and accommodation request has been sent to [facility].    I will f/u with pt at [phone].

## 2024-05-01 NOTE — Telephone Encounter
 [facility]  Telephone Call  Time Start: [phone]  Time End: 9062    Date:  05/01/2024    Name: Jodi Bentley  MRN: [id number]  DOB: 13-Aug-1941  Sex: female  Phone Number: [phone] (home)   Mailing address: [address] [place] [address]    Caregiver/Decision maker:   Name/Relationship: Dance movement psychotherapist [phone]    Primary Care Physician: Lajean Colorado, Chiquita HERO., MD.     Reason for contact: follow up after discharge    PMH:    Past Medical History:   Diagnosis Date    Anxiety     Cancer (HCC/RAF)     Dementia (HCC/RAF)     Depression     Histoplasmosis        Medication List:    Outpatient Encounter Medications as of 05/01/2024   Medication Sig Dispense Refill    acetaminophen  500 mg tablet Take 1 tablet (500 mg total) by mouth every six (6) hours as needed for Pain (mild to moderate pain).      acetaminophen  500 mg tablet Take 2 tablets (1,000 mg total) by mouth every six (6) hours as needed for Pain or Fever (T>38.5C or 101.34F) (severe pain).      amLODIPine  10 mg tablet Take 10 mg once daily as needed for systolic blood pressure above 839 mmHg. (Patient taking differently: Take 1 tablet (10 mg total) by mouth daily as needed (SBP > 160).) 30 tablet 0    busPIRone  15 mg tablet Take 1 tablet (15 mg total) by mouth three (3) times daily. 90 tablet 1    busPIRone  15 mg tablet Take 1 tablet (15 mg total) by mouth daily as needed (anxiety/agitation).      HYDROcodone-acetaminophen  5-325 mg tablet Take 1 tablet by mouth every four (4) hours as needed for Moderate Pain (Pain Scale 4-6) or Severe Pain (Pain Scale 7-10).      lidocaine  5% patch APPLY 1 PATCH TOPICALLY DAILY (12 HOURS ON AND 12 HOURS OFF) TO LOWER BACK (Patient taking differently: Place 1 patch onto the skin daily.) 30 patch 11    loperamide 2 mg capsule Take 1 capsule (2 mg total) by mouth every six (6) hours as needed for Diarrhea.      loratadine  10 mg tablet TAKE 1 TABLET BY MOUTH DAILY DX: ALLERGIES (Patient taking differently: Take 1 tablet (10 mg total) by mouth daily.) 90 tablet 3    mineral oil-hydrophilic petrolatum (AQUAPHOR) ointment Apply 1 g topically two (2) times daily. Apply to feet      pancrelipase , Lip-Prot-Amyl, (CREON ) 36000 units DR capsule Take 1 capsule (36,000 units of lipase total) by mouth three (3) times daily with meals. 90 capsule 11    pantoprazole  40 mg DR tablet TAKE 1 TABLET BY MOUTH DAILY GIVE AT 6:30AM BEFORE BREAKFAST (DO NOT CRUSH) FOR DX: GERD (Patient taking differently: Take 1 tablet (40 mg total) by mouth every morning before breakfast.) 90 tablet 3    QUEtiapine  25 mg tablet Take 0.5 tablets (12.5 mg total) by mouth at bedtime. 45 tablet 3    rivastigmine  4.5 mg capsule Take 1 capsule (4.5 mg total) by mouth two (2) times daily before meals. 180 capsule 1    venlafaxine  150 mg 24 hr capsule Take 2 capsules (300 mg total) by mouth daily. 180 capsule 1    vitamin D, cholecalciferol, 25 mcg (1000 units) tablet TAKE 1 TABLET BY MOUTH DAILY FOR DX:  SUPPLEMENT (Patient taking differently: Take 1 tablet (25 mcg total) by mouth daily.) 90 tablet 3     Facility-Administered Encounter Medications as of 05/01/2024   Medication Dose Route Frequency Provider Last Rate Last Admin    [COMPLETED] calcium  gluconate 1 g in sodium chloride  50 mL IVPB RTU  1 g Intravenous Q1H Helgeson, My Tien Thi, PharmD 50 mL/hr at 04/27/24 0932 1 g at 04/27/24 0932    [COMPLETED] potassium chloride  ER tab 40 mEq  40 mEq Oral Once Servano, Guilianne R., MD   40 mEq at 04/26/24 0919    [COMPLETED] potassium chloride  ER tab 40 mEq  40 mEq Oral Once Servano, Guilianne R., MD   40 mEq at 04/28/24 0834    [DISCONTINUED] acetaminophen  650 mg supp 650 mg  650 mg Rectal Q6H PRN Trudy Aureliano SQUIBB., MD        [DISCONTINUED] acetaminophen  tab 650 mg  650 mg Oral Q6H PRN Trudy Aureliano SQUIBB., MD   650 mg at 04/25/24 0848    [DISCONTINUED] amLODIPine  tab 10 mg  10 mg Oral Daily Trudy Aureliano SQUIBB., MD   10 mg at 04/29/24 9145    [DISCONTINUED] bisacodyl EC tab 5 mg  5 mg Oral Daily PRN Trudy Aureliano SQUIBB., MD        [DISCONTINUED] busPIRone  tab 15 mg  15 mg Oral TID Trudy Aureliano SQUIBB., MD   15 mg at 04/29/24 1306    [DISCONTINUED] calcium  gluconate 2 g in sodium chloride  100 mL IVPB RTU  2 g Intravenous PRN Alghussein, Mohammad S., MD        [DISCONTINUED] calcium  gluconate 2 g in sodium chloride  100 mL IVPB RTU  2 g Intravenous Once Vangala, Srija, MD        [DISCONTINUED] cefTRIAXone  1 g in sterile water PF 10 mL IV injection  1 g IV Push Q24H Trudy Aureliano SQUIBB., MD   1 g at 04/24/24 1707    [DISCONTINUED] cloNIDine tab 0.1 mg  0.1 mg Oral Q8H PRN Trudy Aureliano SQUIBB., MD        [DISCONTINUED] dextrose 50% inj 25 g  25 g IV Push PRN Vangala, Srija, MD        [DISCONTINUED] ertapenem  500 mg in sterile water PF 5 mL IV injection  500 mg IV Push Q24H Servano, Guilianne R., MD   500 mg at 04/29/24 1540    [DISCONTINUED] heparin  5000 unit/mL inj 5,000 Units  5,000 Units Subcutaneous BID Servano, Guilianne R., MD   5,000 Units at 04/29/24 0854    [DISCONTINUED] hydrALAZINE  20 mg/mL inj 2.5 mg  2.5 mg IV Push Q6H PRN Alghussein, Mohammad S., MD   2.5 mg at 04/24/24 1316    [DISCONTINUED] lidocaine  5% patch 1 patch  1 patch Transdermal Q24H Trudy Aureliano SQUIBB., MD   1 patch at 04/28/24 1857    [DISCONTINUED] loratadine  tab 10 mg  10 mg Oral Daily Trudy Aureliano SQUIBB., MD   10 mg at 04/29/24 9145    [DISCONTINUED] magnesium hydroxide 400 mg/5 mL susp 30 mL  30 mL Oral Daily PRN Trudy Aureliano SQUIBB., MD        [DISCONTINUED] magnesium sulfate 2 g in water for injection 50 mL RTU  2 g Intravenous PRN Alghussein, Mohammad S., MD        [DISCONTINUED] magnesium sulfate 4 g in water for injection 100 mL RTU  4 g Intravenous PRN Alghussein, Mohammad S., MD        [DISCONTINUED] melatonin tab 6 mg  6 mg Oral QHS PRN Trudy Aureliano SQUIBB., MD   6 mg at 04/24/24 2036    [DISCONTINUED] ondansetron 4 mg/2 mL inj 4 mg  4 mg Intravenous Q8H PRN Trudy Aureliano SQUIBB., MD        [DISCONTINUED] ondansetron tab 4 mg  4 mg Oral Q8H PRN Trudy Aureliano SQUIBB., MD        [DISCONTINUED] pantoprazole  DR tab 40 mg  40 mg Oral Daily Trudy Aureliano SQUIBB., MD   40 mg at 04/29/24 9145    [DISCONTINUED] potassium chloride  ER tab 20 mEq  20 mEq Oral PRN Alghussein, Mohammad S., MD        [DISCONTINUED] potassium chloride  ER tab 40 mEq  40 mEq Oral PRN Alghussein, Mohammad S., MD   40 mEq at 04/24/24 9145    [DISCONTINUED] potassium chloride  ER tab 60 mEq  60 mEq Oral PRN Alghussein, Mohammad S., MD        [DISCONTINUED] potassium chloride  ER tab 80 mEq  80 mEq Oral PRN Alghussein, Mohammad S., MD        [DISCONTINUED] QUEtiapine  tab 12.5 mg  12.5 mg Oral QHS Trudy Aureliano SQUIBB., MD   12.5 mg at 04/28/24 2228    [DISCONTINUED] rivastigmine  cap 4.5 mg  4.5 mg Oral BID AC Trudy Aureliano SQUIBB., MD   4.5 mg at 04/29/24 1540    [DISCONTINUED] senna tab 1 tablet  1 tablet Oral QHS PRN Trudy Aureliano SQUIBB., MD        [DISCONTINUED] sodium chloride  0.9% IV soln  75 mL/hr Intravenous Continuous Trudy Aureliano SQUIBB., MD   Stopped at 04/26/24 0920    [DISCONTINUED] venlafaxine  cap ER24 300 mg  300 mg Oral Daily Trudy Aureliano SQUIBB., MD   300 mg at 04/29/24 9146        Interim:   Telephone call to [name] [phone].  Pt was discharged back to [name], family able to get private caregiver   She was d/c under  the care of hospice [facility], who reportedly can assist in case pt removes her foley.  Dr. Lajean Colorado still following her care.    A/P:   Family aware pt will be discharged from [facility] after a couple of weeks if she continues to stay on hospice care.  Discussed recommendations for private caregiver.    Follow-up:   1 week    Rosaline Santos, DNP  Dementia Care Specialist  [facility]  05/01/2024    [facility]     Green  []      Yellow  []      Red  [x]          I have spent 10 minutes to review patient?s medical records and/or care coordination related to dementia after direct patient care.     []   Dementia workup (including TSH, Vitamin B12, RPR, brain imaging)   []   Neurology, Psychiatry, Neuropsychology consultation note, progress notes, H&P, PCP notes   []  ER/hospitalization/NPI admissions/SNF stay   []  Advance Directive/POLST    [x]  Family telephone meeting    []  Consultation with other providers

## 2024-05-01 NOTE — Telephone Encounter
 Appointment Accommodation Request      Appointment Type: NEW - Hydrocele, hospital follow     Reason for sooner request: Alexia from [facility] hospitalist noted patient needs to be seen in 1-2 weeks    Date/Time Requested (If any): 1-2 weeks    Last seen by MD: n/a    Any Symptoms:  []  Yes  [x]  No      If yes, what symptoms are you experiencing:   Duration of symptoms (how long):     Patient or caller was offered an appointment but declined.    Patient or caller was advised to seek emergency services if conditions are urgent or emergent.    Patient or caller has been notified of the turnaround time of 1-2 business (days).

## 2024-05-04 ENCOUNTER — Ambulatory Visit: Payer: TRICARE (CHAMPUS)

## 2024-05-04 ENCOUNTER — Telehealth: Payer: TRICARE (CHAMPUS)

## 2024-05-04 NOTE — Telephone Encounter
 Mild hydronephrosis. Next available

## 2024-05-04 NOTE — Telephone Encounter
 Appointment Accommodation Request      Appointment Type: New    Reason for sooner request: Pt's daughter, [name], states that Pt has an Urgent Referral from Dr. Lajean Colorado and would like to know if Pt can be accommodated sooner than next available appt in [place]. Please assist, thank you!    CB: [phone]    Date/Time Requested (If any): Late mornings preferred    Last seen by MD: New    Any Symptoms:  []  Yes  [x]  No      If yes, what symptoms are you experiencing:   Duration of symptoms (how long):     Patient or caller was offered an appointment but declined.    Patient or caller was advised to seek emergency services if conditions are urgent or emergent.    Patient or caller has been notified of the turnaround time of 1-2 business (days).

## 2024-05-05 NOTE — Telephone Encounter
 Appointment Accommodation Request      Appointment Type:Consult    Reason for sooner request:Catheter removal/Uti/retention/on hospice    Date/Time Requested (If any): asap    Last seen by MD: ER 1 week ago    Any Symptoms:  []  Yes  [x]  No      If yes, what symptoms are you experiencing:   Duration of symptoms (how long):     Patient or caller was offered an appointment but declined.    Patient or caller was advised to seek emergency services if conditions are urgent or emergent.    Patient or caller has been notified of the turnaround time of 1-2 business (days).

## 2024-05-06 ENCOUNTER — Telehealth: Payer: TRICARE (CHAMPUS)

## 2024-05-06 NOTE — Progress Notes
 UROLOGY HISTORY & PHYSICAL    PATIENT:  Jodi Bentley  MRN:  [id number]  DOB:  Sep 29, 1941   DATE OF SERVICE:  05/08/2024   SERVICE:  Urology     REFERRING PRACTITIONER: No ref. provider found  ?PRIMARY CARE PROVIDER: Lajean Colorado, Chiquita HERO., MD    CHIEF COMPLAINT: Urinary retention     HISTORY OF PRESENT ILLNESS:  Jodi Bentley is a 83 y.o.  female with a past medical history of dementia and malignant metastatic NET presenting for trial of void. She was initially seen in the [facility] ED on 04/23/24 for generalized weakness and right sided lean. She was found to have an ESBL urinary tract infection. She received 5 days of ertapenem  inpatient. She was discovered to have acute on chronic renal failure with hydronephrosis discovered on renal ultrasound. Her renal function improved with IV fluids and Foley placement. She was discharged on 04/29/24 with Foley in place. Of note, she is full-code but has home hospice services. She is only on hospice to ensure the patient has proper care for her catheter.    She presents with her daughter and niece. The patient's daughter reports her monthly injections have kept her malignancy stable. She reports the patient has recurrent urinary tract infections. Her typical urinary tract infection symptoms are cognitive deficits, abnormal motor function, leaning to one side, and drooling. Per the patient's daughter, the inpatient care team performed a trial of void with significant post void residual.    Prior to her recent infection, she was wearing briefs for 1 year since femur fracture in 11/2022. She has unsteady gait and forget to use her walker, leading to frequent falls when ambulating to the toilet. She would have feces on her hands, which may have contributed to her recurrent urinary tract infections. Per the patient's daughter, the patient gets hourly checks at her facility. The patient was complaining about her catheter 1 day ago, so she is bothered. Catheter has been in place for 9 days. She saw nephrology in 04/24/24 while inpatient, but she does not have a nephrologist following her.    ALLERGIES: [] NKDA  Allergies   Allergen Reactions    Ace Inhibitors Cough     Other reaction(s): Other (See Comments)  cough  cough  cough      Sulfasalazine     Sulfa Antibiotics Rash and Other (See Comments)     MEDICATIONS:    Outpatient Medications Prior to Visit   Medication Sig    acetaminophen  500 mg tablet Take 1 tablet (500 mg total) by mouth every six (6) hours as needed for Pain (mild to moderate pain).    acetaminophen  500 mg tablet Take 2 tablets (1,000 mg total) by mouth every six (6) hours as needed for Pain or Fever (T>38.5C or 101.83F) (severe pain).    amLODIPine  10 mg tablet Take 10 mg once daily as needed for systolic blood pressure above 839 mmHg. (Patient taking differently: Take 1 tablet (10 mg total) by mouth daily as needed (SBP > 160).)    busPIRone  15 mg tablet Take 1 tablet (15 mg total) by mouth three (3) times daily.    busPIRone  15 mg tablet Take 1 tablet (15 mg total) by mouth daily as needed (anxiety/agitation).    HYDROcodone-acetaminophen  5-325 mg tablet Take 1 tablet by mouth every four (4) hours as needed for Moderate Pain (Pain Scale 4-6) or Severe Pain (Pain Scale 7-10).    lidocaine  5% patch APPLY 1 PATCH TOPICALLY DAILY (12  HOURS ON AND 12 HOURS OFF) TO LOWER BACK (Patient taking differently: Place 1 patch onto the skin daily.)    loperamide 2 mg capsule Take 1 capsule (2 mg total) by mouth every six (6) hours as needed for Diarrhea.    loratadine  10 mg tablet TAKE 1 TABLET BY MOUTH DAILY DX: ALLERGIES (Patient taking differently: Take 1 tablet (10 mg total) by mouth daily.)    mineral oil-hydrophilic petrolatum (AQUAPHOR) ointment Apply 1 g topically two (2) times daily. Apply to feet    pancrelipase , Lip-Prot-Amyl, (CREON ) 36000 units DR capsule Take 1 capsule (36,000 units of lipase total) by mouth three (3) times daily with meals.    pantoprazole  40 mg DR tablet TAKE 1 TABLET BY MOUTH DAILY GIVE AT 6:30AM BEFORE BREAKFAST (DO NOT CRUSH) FOR DX: GERD (Patient taking differently: Take 1 tablet (40 mg total) by mouth every morning before breakfast.)    QUEtiapine  25 mg tablet Take 0.5 tablets (12.5 mg total) by mouth at bedtime.    rivastigmine  4.5 mg capsule Take 1 capsule (4.5 mg total) by mouth two (2) times daily before meals.    venlafaxine  150 mg 24 hr capsule Take 2 capsules (300 mg total) by mouth daily.    vitamin D, cholecalciferol, 25 mcg (1000 units) tablet TAKE 1 TABLET BY MOUTH DAILY FOR DX: SUPPLEMENT (Patient taking differently: Take 1 tablet (25 mcg total) by mouth daily.)     No facility-administered medications prior to visit.     PAST MEDICAL HISTORY:  Past Medical History:   Diagnosis Date    Anxiety     Cancer (HCC/RAF)     Dementia (HCC/RAF)     Depression     Histoplasmosis      PAST SURGICAL HISTORY:  [] N/C  Past Surgical History:   Procedure Laterality Date    ABDOMINAL SURGERY      BREAST SURGERY       FAMILY HISTORY:  [] N/C  No family history on file.  SOCIAL HISTORY:    The patient reports that she has quit smoking. Her smoking use included cigarettes. She has been exposed to tobacco smoke. She has never used smokeless tobacco. She reports that she does not currently use alcohol. She reports that she does not use drugs.    REVIEW OF SYSTEMS:   A 10-point review of systems was performed and is scanned into the record.     PHYSICAL EXAM:   VITALS:   Temp Readings from Last 3 Encounters:   05/08/24 (!) 35.6 ?C (96.1 ?F) (Forehead)   04/29/24 37.1 ?C (98.8 ?F) (Oral)   04/20/24 36.1 ?C (96.9 ?F) (Forehead)      BP Readings from Last 3 Encounters:   05/08/24 94/59   04/29/24 137/70   04/20/24 156/74     Resp Readings from Last 3 Encounters:   05/08/24 18   04/29/24 18   04/20/24 16     PF Readings from Last 3 Encounters:   No data found for PF     Physical Examination:   General appearance - alert, well appearing, in no distress  Mental status - affect appropriate to mood, cooperative  Eyes - sclera anicteric  Nose - normal and patent, no erythema  Mouth - mucous membranes moist  Chest - no tachypnea, retractions or cyanosis  Extremities - no clubbing or cyanosis, no ulcers, gangrene or atrophic changes  Skin - normal coloration and turgor, no rashes, no suspicious skin lesions noted    LAB REVIEW:  Lab Results   Component Value Date    WBC 20.40 (H) 05/07/2024    HGB 12.9 05/07/2024    HCT 40.5 05/07/2024    MCV 95.5 05/07/2024    PLT 408 (H) 05/07/2024     Results for orders placed or performed during the hospital encounter of 04/23/24   Basic Metabolic Panel   Result Value Ref Range    Sodium 133 (L) 136 - 145 mmol/L    Potassium 3.7 3.5 - 5.1 mmol/L    Chloride 103 98 - 107 mmol/L    Total CO2 21 21 - 32 mmol/L    Anion Gap 9 8 - 19 mmol/L    Glucose 149 (H) 74 - 106 mg/dL    Creatinine 8.39 (H) 0.55 - 1.30 mg/dL    Estimated GFR 32 See GFR Additional Information mL/min/1.72m2    GFR Additional Information See Comment     Urea Nitrogen 23 (H) 7 - 18 mg/dL    Calcium  8.3 (L) 8.5 - 10.1 mg/dL     Results for orders placed or performed during the hospital encounter of 04/23/24   UA,Dipstick    Specimen: Clean Catch, Midstream; Urine   Result Value Ref Range    Urine Color Light-Scalp Level (A)      Specific Gravity 1.026 1.005 - 1.030    pH,Urine 6.0 5.0 - 8.0    Blood 2+ (A) Negative    Bilirubin Negative Negative    Ketones Negative Negative    Glucose Negative Negative    Protein 1+ (A) Negative    Leukocyte Esterase 3+ (A) Negative    Nitrite Negative Negative   UA,Microscopic    Specimen: Clean Catch, Midstream; Urine   Result Value Ref Range    RBC per uL 354 (H) 0 - 11 cells/uL    WBC per uL >1,000 (H) 0 - 22 cells/uL    RBC per HPF 64 (H) 0 - 2 cells/HPF    WBC per HPF >200 (H) 0 - 4 cells/HPF    Bacteria Present (A) Absent    Squamous Epi Cells 27 (H) 0 - 17 cells/uL   Results for orders placed or performed in visit on 08/29/23   Urinalysis Routine Specimen: Clean Catch, Midstream; Urine    Narrative    The following orders were created for panel order Urinalysis Routine.  Procedure                               Abnormality         Status                     ---------                               -----------         ------                     UA,Dipstick[[id number]]                  Normal              Final result               UA,Microscopic[[id number]]               Normal  Final result                 Please view results for these tests on the individual orders.     Results for orders placed or performed during the hospital encounter of 04/23/24   Bacterial Culture Urine    Specimen: Clean Catch, Midstream; Urine   Result Value Ref Range    Bacterial Culture Urine >100,000 CFU/mL Escherichia coli (A)        Susceptibility    Escherichia coli - MIC (MCG/ML)*     Amoxicillin + Clavulanate 16 Intermediate      Ampicillin >=32 Resistant      Oral Cephalosporins R Resistant      Ceftriaxone  >=64 Resistant      Ciprofloxacin  >=4 Resistant      Ertapenem  <=0.12 Susceptible      Levofloxacin >=8 Resistant      Nitrofurantoin  <=16 Susceptible      Tobramycin 8 Resistant      Trimethoprim/Sulfamethoxazole <=20 Susceptible      * Nitrofurantoin  should not be used in patients with CrCl < 30 mL/min or in patients with suspected or confirmed pyelonephritis.    For complicated infections, carbapenems are preferred for these ceftriaxone  resistant pathogens.                Radiology:   02/28/24 PET CT  1. Grossly stable size and intense DOTATATE uptake of the numerous ill-defined hepatic metastatic lesions. If clinically indicated size of the hepatic metastases may be better characterized with MRI.     2. Stable to slight increased intensity of small foci of DOTATATE expression that likely corresponds to soft tissue attenuation adjacent to the uterus with somewhat limited evaluation due to misregistration but suspicious for malignant deposits.     3. Few new small foci of mild radiotracer uptake as described above are indeterminate and attention on follow up is recommended.      4. Stable to severe wedge compression deformity of the L1 vertebral body without significant radiotracer uptake.      5. Persistent mild circumferential urinary bladder wall thickening with trabeculation and small urinary bladder diverticula. Correlate with urinalysis for possible cystitis.    04/23/24 Renal ultrasound  1.  Mild right-sided hydronephrosis   2.  Irregular bladder wall with debris present  3.  Innumerable liver lesions    Results for orders placed during the hospital encounter of 04/23/24    US  kidney non-vascular bilat (bladder images included) 04/27/2024 04/27/2024    Impression  1. Mild right hydronephrosis, which appears decreased compared to prior exam dated 04/23/2024. No left hydronephrosis. The bilateral kidneys are otherwise unremarkable.    2. Bladder appears completely decompressed with Foley catheter, limiting evaluation.    05/07/24 Renal ultrasound  Mild right pelviectasis, improved from prior.       STUDIES:   [x] Urine dip:   Lab Results   Component Value Date    Bilirubin Negative 06/01/2023    Ketone Negative 06/01/2023    Specific Gravity 1.015 06/01/2023    Blood Moderate (++) (A) 06/01/2023    pH 6.0 06/01/2023    Protein 100 mg/dL (++) (A) 91/89/7975    Urobilinogen 0.2 mg/dL 91/89/7975    Nitrite Positive (A) 06/01/2023    Leukocyte Large (+++) (A) 06/01/2023     [] Imaging:  [x] Other: PVR    No results found for: ''UROPVRURINEVOLUME''    IMPRESSION:   Philomina Leon is a 83 y.o. female with urinary retention. She was initially seen  in the [facility] ED on 04/23/24 for generalized weakness and right sided lean. She was found to have an ESBL urinary tract infection. She received 5 days of ertapenem  inpatient. She was discovered to have acute on chronic renal failure with hydronephrosis discovered on renal ultrasound. Her renal function improved with IV fluids and Foley placement. She was discharged on 04/29/24 with Foley in place. Of note, she is full-code but has home hospice services.     Discussed debris in the bladder indicated prolonged post void residual. This likely caused her recurrent urinary tract infections. We will need to drain her urine as best we can to limit risk of renal injury or urosepsis. I independently reviewed PET CT from 02/28/24. The bladder revealed diverticulum indicating her bladder function has been poor for a long time. There is a low chance that she passes trial of void today. We discussed SPT placement for definitive management. The patient and her family will consider.    Reviewed most recent labs on 05/07/24. WBC elevated at 20.40. Creatinine is at baseline. Discussed the role of her neuroendocrine tumor in contributing to her urinary symptoms. Discussed that quality of life is important given her advanced age. The patient's daughter and niece inquire about second opinion from an oncologist. I placed a referral so they may explore their options. We discussed her case extensively. We will leave the catheter in place and revisit SPT placement at next visit in 3 months. The patient's family is amenable.    She is hypotensive to 94/59 in clinic today. She averages 130/70 at baseline. She is presently afebrile with normal heart rate and respiration rate. I recommend the patient see her PCP urgently to evaluate her acute hypotension further. Please hold amlodipine  until she sees her PCP. The patient's daughter and niece are amenable. Devaughn, LVN, discussed ED precautions with the patient's family. They expressed understanding and stated minimal concern for orthostatic hypotension as the patient does not ambulate without assistance.    The patient's daughter is considering minimizing procedures to maximize the patient's quality of life.    PLAN:   Shower at least every other day.  Timed voiding every 2-4 hours.  Consider second opinion with oncology. Follow up with oncology.  Establish care with nephrology. Referral sent.  Home health catheter changes every month.  Return to clinic in 3 months to decide on SPT placement. Video visit okay.    This note has been dictated using voice recognition software. It was reviewed for major content but may contain typographical errors due to difficulties with voice recognition software.    CC:  Lajean Ulysses Chiquita CHRISTELLA., MD  [address]  [place] [address]  Tel: [phone]  Fax: [phone]    SCRIBE ATTESTATION:  LILLETTE Carolyn Critchley, have assisted Dr. Aleck CROME. Evelena, MD with the documentation for Aliscia Clayton on 05/08/2024.

## 2024-05-06 NOTE — Telephone Encounter
 Faxed Physician Communication response back to [facility] [phone]. Thank you

## 2024-05-06 NOTE — Telephone Encounter
 Patient has been scheduled with Aleck Filippo

## 2024-05-07 ENCOUNTER — Inpatient Hospital Stay: Payer: TRICARE (CHAMPUS)

## 2024-05-07 DIAGNOSIS — N1330 Unspecified hydronephrosis: Secondary | ICD-10-CM

## 2024-05-08 ENCOUNTER — Ambulatory Visit: Payer: TRICARE (CHAMPUS) | Attending: Surgical Oncology

## 2024-05-08 ENCOUNTER — Telehealth: Payer: MEDICARE

## 2024-05-08 DIAGNOSIS — N3289 Other specified disorders of bladder: Principal | ICD-10-CM

## 2024-05-08 DIAGNOSIS — C7B8 Other secondary neuroendocrine tumors: Secondary | ICD-10-CM

## 2024-05-08 NOTE — Telephone Encounter
Patient was scheduled for 7/18.

## 2024-05-08 NOTE — Addendum Note
 Addended by: Stephanos Fan LEE on: 05/08/2024 11:11 AM     Modules accepted: Level of Service

## 2024-05-08 NOTE — Telephone Encounter
 Call Back Request      Reason for call back: Rep from [facility] sent us  a form to see if Dr Lajean will follow the patient under hospice and wants to know if you received it. Please call [phone]    Any Symptoms:  []  Yes  []  No      If yes, what symptoms are you experiencing:    Duration of symptoms (how long):    Have you taken medication for symptoms (OTC or Rx):      If call was taken outside of clinic hours:    [] Patient or caller has been notified that this message was sent outside of normal clinic hours.     [] Patient or caller has been warm transferred to the physician's answering service. If applicable, patient or caller informed to please call us  back if symptoms progress.  Patient or caller has been notified of the turnaround time of 1-2 business day(s).

## 2024-05-10 NOTE — Telephone Encounter
 Fax to [facility] to [phone]. Thank you

## 2024-05-11 ENCOUNTER — Inpatient Hospital Stay
Admit: 2024-05-11 | Discharge: 2024-05-11 | Disposition: A | Discharge status: Skilled Nursing Facility | Payer: TRICARE (CHAMPUS) | Source: Skilled Nursing Facility

## 2024-05-11 ENCOUNTER — Other Ambulatory Visit: Payer: TRICARE (CHAMPUS)

## 2024-05-11 ENCOUNTER — Ambulatory Visit: Payer: MEDICARE

## 2024-05-11 ENCOUNTER — Other Ambulatory Visit: Payer: MEDICARE

## 2024-05-11 ENCOUNTER — Ambulatory Visit: Payer: TRICARE (CHAMPUS)

## 2024-05-11 ENCOUNTER — Telehealth: Payer: MEDICARE

## 2024-05-11 DIAGNOSIS — G44319 Acute post-traumatic headache, not intractable: Principal | ICD-10-CM

## 2024-05-11 NOTE — Telephone Encounter
 Spoke with [name] in regard to [employer]. Agree with hospice, offered my continued support. She will let me know if she needs a new referral for hospice and/or PT. Amlodipine  has parameters, so she should not be receiving it if SBP is below 160 mmHg.    Jodi Lajean Colorado, MD  Family Medicine

## 2024-05-11 NOTE — ED Notes
 FROM THE [facility]

## 2024-05-11 NOTE — Discharge Instructions
 Return to ER immediately with any concerns.

## 2024-05-11 NOTE — ED Provider Notes
 [facility]  Emergency Department Service Report    Jodi Bentley 83 y.o. female , presents with Fall    Triage   Arrived on 05/11/2024 at 2:50 AM   Arrived by BLS [13]    ED Triage Vitals [05/11/24 0253]   Temp Temp Source BP Heart Rate Resp SpO2 O2 Device Pain Score Weight   36.4 ?C (97.5 ?F) Oral 120/73 66 18 99 % None (Room air) Zero 59 kg (130 lb)       Pre hospital care:  Prehospital IV Access  Pre-Arrival IV Access: No    Allergies   Allergen Reactions    Ace Inhibitors Cough     Other reaction(s): Other (See Comments)  cough  cough  cough      Sulfasalazine     Sulfa Antibiotics Rash and Other (See Comments)         Initial Physician Contact       Initial Contact Completed?: Yes (05/11/24 0250)      History   HPI 83 year old female past medical history hypertension and dementia presents with chief complaint of fall and scalp contusion.    The patient got up from her bed tonight to go to the bathroom and somehow tripped or lost her balance and fell, striking her occiput on a dresser.    No other complaints.      Language Assistance     Language Resource Used?: Patient declined                   Past Medical History:   Diagnosis Date    Anxiety     Cancer (HCC/RAF)     Dementia (HCC/RAF)     Depression     Histoplasmosis         Past Surgical History:   Procedure Laterality Date    ABDOMINAL SURGERY      BREAST SURGERY          Past Family History   Family history reviewed by me and there is no pertinent past family history related to the patient's current case and/or care.             Past Social History   she reports that she has quit smoking. Her smoking use included cigarettes. She has been exposed to tobacco smoke. She has never used smokeless tobacco. She reports that she does not currently use alcohol. She reports that she is not currently sexually active. She reports that she does not use drugs.       Physical Exam   Physical Exam  General- well-developed, well-nourished, well-hydrated, no acute distress.  HEENT- small occipital scalp hematoma, trachea midline. No stridor.  No voice change.  No pooling of secretions.  No lip lesions or swelling of the lips.  No visible extraocular muscle movement limitation.  Pupils equal and round.  CV- regular rate and rhythm, no murmur, rubs or gallops.  No peripheral edema.  Pulmonary- clear to auscultation bilaterally, no wheezes/rhonchi/rales.  No retractions.  Abdominal- soft, nontender, nondistended.  MSK- no gross bony abnormalities; no visible limitation of the range of motion of any major joint.  Neuro- no visible strength deficit of the extremities; face symmetric.  Awake, alert.  Skin- no sloughing or bullae.  No other rashes or lesions.  Normal skin color and turgor.  Psych-normal affect; not responding to internal stimuli.  ED Course          Laboratory Results   Labs Reviewed -  No data to display    Imaging Results     CT cervical spine wo contrast   Non-public Result   1.  No evidence of acute fracture or significant alignment abnormality in    the cervical spine.   2.  Osteoporosis and multilevel degenerative change, no evidence to    suggest significant spinal canal stenosis on this study.      This report was electronically signed by Alm Mace MD on 05/11/2024    5:17:22 AM.       by Hev, Eliane (07/21 9480)      CT brain wo contrast   Non-public Result   1.  Cortical and central atrophy, possible small old infarct right frontal    lobe.   2.  No evidence of acute intracranial pathology identified on noncontrast    CT.  No evidence of intracranial hemorrhage, no evidence of skull    fracture.      This report was electronically signed by Alm Mace MD on 05/11/2024    5:15:45 AM.       by Hev, Jarrod (07/21 [id number])          Administered Medications     Medication Administration from 05/11/2024 0250 to 05/11/2024 0536       None                       Procedures       Medical Decision Making     BRIEF NARRATIVE:  The patient is well-appearing and hemodynamically stable prior to discharge; discussed test results with the patient.  I doubt other emergent pathology.  Instructed the patient verbally and in writing to be seen by primary care within 1 week and to return to ER immediately with any concerns.    BULLET LIST:  PROBLEM ADDRESSED:  Fall, head injury  CHRONIC CONDITIONS IMPACTING CARE:  Dementia and hypertension  HISTORY ALSO GIVEN BY:  Paramedics  POSSIBLE DIAGNOSES INCLUDE BUT ARE NOT LIMITED TO:  Cranial fracture, vertebral fracture, subdural hematoma, epidural hematoma, subarachnoid hemorrhage, intraparenchymal hemorrhage  ORDERED AND INDEPENDENTLY INTERPRETED LABS:  ORDERED AND INDEPENDENTLY INTERPRETED IMAGING:  CT head and CT C-spine  OUTSIDE RECORDS REVIEWED:  Record from the home where the patient lives  CONSULTATIONS:  HIGH-RISK DECISIONS:    INDEPENDENT INTERPRETATION OF CT BRAIN NONCON BY ME/ER DOCTOR  I have independently interpreted the CT brain, and I find no hemorrhage.    INDEPENDENT INTERPRETATION OF CT-CERVICAL SPINE BY ME/ER DOCTOR  I have independently interpreted the CT-Cervical spine, and I find no fracture.    Medical Decision Making  Amount and/or Complexity of Data Reviewed  Radiology: ordered.          Launch MDCalc MDM Tool   Click the link above to launch the MDCalc MDM tool. Save the tool results and then refresh the note (lower-left corner or [id number]). The MDM tool results must be imported into the note to sign it.      Clinical Impression           Acute post-traumatic headache, not intractable (Primary)      Prescriptions     New Prescriptions    No medications on file       Disposition and Follow-up   Disposition:  Discharge [1]     Future Appointments   Date Time Provider Department Center   05/18/2024 11:30 AM Dorris, Curlee HERO., MD HEMONCENC [facility]   07/06/2024  2:00 PM Jacquelynne Cornet  J., MD NEUALZTO [place]/   08/07/2024  4:00 PM Evelena Aleck CROME., MD [phone] [facility]   09/02/2024  1:00 PM Reeta Deitra CROME., MD [facility] [place] Mon   09/04/2024 12:00 PM EIC PCT01 PET EIC Frederik Bers       Follow up with:  Lajean Colorado, Chiquita HERO., MD  [address]  [address]  [place] [address]  [phone]    In 3 days        Return precautions are specified on After Visit Summary.                   Shirlean Ozell MICAEL PONCE, MD  05/11/24 [phone]

## 2024-05-11 NOTE — ED Notes
 BLS ETA [id number]

## 2024-05-13 ENCOUNTER — Other Ambulatory Visit: Payer: MEDICARE

## 2024-05-13 ENCOUNTER — Telehealth: Payer: TRICARE (CHAMPUS)

## 2024-05-13 ENCOUNTER — Other Ambulatory Visit: Payer: TRICARE (CHAMPUS)

## 2024-05-13 NOTE — Telephone Encounter
 Faxed to [facility] to [phone]. Thank you

## 2024-05-18 ENCOUNTER — Ambulatory Visit: Payer: TRICARE (CHAMPUS)

## 2024-05-22 NOTE — Telephone Encounter
 Hi,    Patient dis-enrolled due to hospice. They will need to be unaligned.    Thanks,  Princella

## 2024-06-04 ENCOUNTER — Other Ambulatory Visit: Payer: TRICARE (CHAMPUS)

## 2024-06-09 ENCOUNTER — Other Ambulatory Visit: Payer: MEDICARE

## 2024-06-09 NOTE — Telephone Encounter
 Hi ADC team,    Please disenroll since she has been on hospice since July.    Thank you,  Rosaline Santos, DNP, [facility]  Dementia Care Specialist  [facility]  Date: 06/09/2024

## 2024-06-12 ENCOUNTER — Other Ambulatory Visit: Payer: TRICARE (CHAMPUS)

## 2024-06-16 ENCOUNTER — Telehealth: Payer: TRICARE (CHAMPUS)

## 2024-06-18 ENCOUNTER — Ambulatory Visit: Payer: TRICARE (CHAMPUS)

## 2024-06-20 ENCOUNTER — Telehealth: Payer: MEDICARE

## 2024-06-20 NOTE — Telephone Encounter
Faxed to [name] to [phone] thank you

## 2024-06-23 ENCOUNTER — Telehealth: Payer: TRICARE (CHAMPUS)

## 2024-06-23 NOTE — Telephone Encounter
 Incoming call to IR  From daughter    Calling to cancel 06/25/24 sp cath placement appointment, pt not feeling well. Will call back to reschedule

## 2024-06-25 ENCOUNTER — Inpatient Hospital Stay: Payer: MEDICARE | Attending: Surgical Oncology

## 2024-06-29 ENCOUNTER — Other Ambulatory Visit: Payer: TRICARE (CHAMPUS)

## 2024-07-06 ENCOUNTER — Ambulatory Visit: Payer: MEDICARE | Attending: Neurology

## 2024-07-11 DIAGNOSIS — N390 Urinary tract infection, site not specified: Principal | ICD-10-CM

## 2024-07-11 NOTE — ED Provider Notes
 [facility]  Emergency Department Service Report    Nedda Jodi Bentley 83 y.o. female , presents with Generalized Weakness    Triage   Arrived on 07/11/2024 at 7:22 PM   Arrived by BLS [13]    ED Triage Vitals [07/11/24 1929]   Temp Temp Source BP Heart Rate Resp SpO2 O2 Device Pain Score Weight   36.6 ?C (97.9 ?F) Oral 159/70 78 18 98 % None (Room air) Zero 59 kg (130 lb)       Pre hospital care:       Allergies   Allergen Reactions    Ace Inhibitors Cough     Other reaction(s): Other (See Comments)  cough  cough  cough      Sulfasalazine     Sulfa Antibiotics Rash and Other (See Comments)         Initial Physician Contact       Initial Contact Completed?: Yes (07/11/24 2024)    History   HPI    [name] Balzarini is an 83 year old female with a past medical history of late-stage Alzheimer's disease who presented to the emergency room via EMS due to concerns of a possible urinary tract infection and a significant decline in her ability to communicate verbally. History obtained from EMS and family at bedside. Per EMS, they were called to the scene as patient was reported to be more weak than typical.  They report that the staff state that patient can typically feed herself, but she was unable to do so.  Noted that she was hemodynamically stable EN route, so brought her straight to the ER.  Per family, she has experienced progressive deterioration over the past 8 to 12 weeks, with an accelerated decline in her verbal communication, now being mostly nonverbal and only able to mumble, making it very difficult to understand her speech. Her family and care facility have noted this change, and her granddaughter observed similar behavior yesterday, so no concern for acute focal neurological change.  Family notes that there is a history of recurrent urinary tract infections, and the current concern is based on the appearance and odor of her urine. No fevers have been reported by her family or care facility.  History otherwise limited in setting of patient's nonverbal status.    Language Assistance     Language Resource Used?: Patient declined                   Past Medical History:   Diagnosis Date    Anxiety     Cancer (HCC/RAF)     Dementia (HCC/RAF)     Depression     Histoplasmosis         Past Surgical History:   Procedure Laterality Date    ABDOMINAL SURGERY      BREAST SURGERY          Past Family History   Family history reviewed by me and there is no pertinent past family history related to the patient's current case and/or care.             Past Social History   she reports that she has quit smoking. Her smoking use included cigarettes. She has been exposed to tobacco smoke. She has never used smokeless tobacco. She reports that she does not currently use alcohol. She reports that she is not currently sexually active. She reports that she does not use drugs.       Physical Exam   Physical Exam  Vitals and nursing note reviewed. Exam conducted with a chaperone present.   Constitutional:       General: She is not in acute distress.     Appearance: She is ill-appearing (Chronically).   HENT:      Head: Normocephalic and atraumatic.      Right Ear: External ear normal.      Left Ear: External ear normal.      Nose: Nose normal. No rhinorrhea.      Mouth/Throat:      Mouth: Mucous membranes are dry.      Pharynx: Oropharynx is clear. No posterior oropharyngeal erythema.   Eyes:      General: No scleral icterus.     Extraocular Movements: Extraocular movements intact.      Conjunctiva/sclera: Conjunctivae normal.   Cardiovascular:      Rate and Rhythm: Normal rate and regular rhythm.      Pulses: Normal pulses.      Heart sounds: Normal heart sounds. No murmur heard.  Pulmonary:      Effort: Pulmonary effort is normal.      Breath sounds: Normal breath sounds. No wheezing, rhonchi or rales.   Abdominal:      General: Abdomen is flat.      Palpations: Abdomen is soft.      Tenderness: There is no abdominal tenderness. Genitourinary:     Comments: Vulva with erythematous skin changes   Musculoskeletal:         General: No swelling, deformity or signs of injury.      Cervical back: Normal range of motion and neck supple. No rigidity.   Skin:     General: Skin is warm and dry.      Capillary Refill: Capillary refill takes 2 to 3 seconds.      Findings: Erythema (stage 1 pressure wound to buttocks) present.   Neurological:      General: No focal deficit present.      Mental Status: She is alert. She is disoriented.      Sensory: No sensory deficit.      Motor: No weakness.   Psychiatric:      Comments: Unable to assess         ED Course        Laboratory Results     Labs Reviewed   BASIC METABOLIC PANEL - Abnormal; Notable for the following components:       Result Value    Chloride 109 (*)     Anion Gap 4 (*)     Urea Nitrogen 24 (*)     All other components within normal limits   CBC (PERFORMABLE) - Abnormal; Notable for the following components:    White Blood Cell Count 12.83 (*)     Red Blood Cell Count 3.60 (*)     Hemoglobin 10.8 (*)     Hematocrit 33.1 (*)     Red Cell Distribution Width-SD 50.1 (*)     All other components within normal limits   DIFFERENTIAL, AUTOMATED (PERFORMABLE) - Abnormal; Notable for the following components:    Absolute Neut Count 9.18 (*)     Absolute Mono Count 1.09 (*)     Absolute Immature Gran Count 0.06 (*)     All other components within normal limits   UA,DIPSTICK - Abnormal; Notable for the following components:    Blood 2+ (*)     Protein 1+ (*)     Leukocyte Esterase 3+ (*)     Nitrite  2+ (*)     All other components within normal limits   UA,MICROSCOPIC - Abnormal; Notable for the following components:    RBC per uL 669 (*)     WBC per uL >1,000 (*)     RBC per HPF 122 (*)     WBC per HPF >200 (*)     WBC Clumps Present (*)     All other components within normal limits   HS TROPONIN I (SINGLE) - Normal   SEPSIS LACTATE - Normal   BACTERIAL CULTURE BLOOD    Narrative:     The following orders were created for panel order Blood culture #1.  Procedure                               Abnormality         Status                     ---------                               -----------         ------                     Bacterial Culture Aonni[[id number]]      Normal              Preliminary result           Please view results for these tests on the individual orders.   BACTERIAL CULTURE BLOOD    Narrative:     The following orders were created for panel order Blood culture #2.  Procedure                               Abnormality         Status                     ---------                               -----------         ------                     Bacterial Culture Aonni[[id number]]      Normal              Preliminary result           Please view results for these tests on the individual orders.   CBC & AUTO DIFFERENTIAL    Narrative:     The following orders were created for panel order CBC & Auto Differential (ED Altered Mental Status: Nurse Protocol).  Procedure                               Abnormality         Status                     ---------                               -----------         ------  RAR[[id number]]                          Abnormal            Final result               Differential, Automated[[id number]]      Abnormal            Final result                 Please view results for these tests on the individual orders.   URINALYSIS,ROUTINE    Narrative:     The following orders were created for panel order UA complete (dipstick + microscopy).  Procedure                               Abnormality         Status                     ---------                               -----------         ------                     UA,Dipstick[[id number]]                  Abnormal            Final result               UA,Microscopic[[id number]]               Abnormal            Final result                 Please view results for these tests on the individual orders.   POCT GLUCOSE Imaging Results     XR chest ap portable (1 view)   Final Result by Norita Juliene SAILOR., MD (09/21 1638)   IMPRESSION:        LINES AND TUBES: none      MEDIASTINUM: Normal cardiomediastinal silhouette.      LUNGS: Normal lung volumes. No consolidation. Calcified granuloma again identified in the right lateral lung base      PLEURA: No pleural effusions. No pneumothorax.      OSSEOUS AND SOFT TISSUES: No acute osseous abnormalities. Diffuse osteopenia with degenerative changes of the thoracic spine. Multiple left axillary surgical clips            Signed by: Juliene Norita   07/12/2024 4:38 PM          Administered Medications     Medication Administration from 07/11/2024 1923 to 07/12/2024 0246         Date/Time Order Dose Route Action Action by Comments     07/12/2024 0050 PDT ertapenem  1 g in sterile water PF 10 mL IV injection 1 g Intravenous Given Gaines Shaver, RN --               enoxaparin  40 mg/0.4 mL inj 40 mg is a High-risk medication as frequent coagulations labs needed to be checked for therapeutic monitoring.       Procedures     Procedures     Medical Decision Making  Ger Nicks is a 83 y.o. female with past medical history as documented above, who presents to ER via EMS from care facility with concern for progressively worsening weakness and decline.  Vital signs are stable.  Physical exam as documented above.  Given the patient's presentation, most concerning for possible complicated urinary tract infection.  She has a history of your recurrent infections in the past.  I considered other causes of her presentation such as stroke, metabolic derangement, endocrinopathy, but these are less likely.     Plan  - Order a comprehensive workup to assess for underlying issues due to inability to communicate symptoms.  - Order urinalysis to evaluate for urinary tract infection and sent a culture for sensitivities  - will send full infectious workup.  - giving ertapenem  IV based on personal review of her prior culture data  - continue cardiac monitor  - Monitoring for any acute changes or emergent conditions requiring intervention.   - plan for medicine admission        Medical Decision Making  Amount and/or Complexity of Data Reviewed  Independent Historian: caregiver and EMS     Details: And family as documented in HPI  External Data Reviewed: labs.     Details: Reviewed prior culture data from prior UTIs, and looked for sensitivities to guide medication administration  Labs: ordered.  ECG/medicine tests: ordered.     Details: On my independent interpretation of the ECG, normal sinus rhythm, rate of 75, no ST or T-wave changes, overall normal ECG.  Largely unchanged when compared to prior from July 2025    Risk  Decision regarding hospitalization.          Clinical Impression           Complicated UTI (urinary tract infection) (Primary)  Altered mental status, unspecified altered mental status type  History of dementia  Generalized weakness  Nonverbal      Prescriptions     Current Discharge Medication List            Disposition and Follow-up   Disposition:  Admit [3] 07/12/2024  2:46 AM    Future Appointments   Date Time Provider Department Center   09/02/2024  1:00 PM Reeta Deitra CROME., MD [facility] Mon   09/04/2024 12:00 PM [facility] PCT01 PET [facility]       Follow up with:  No follow-up provider specified.    Return precautions are specified on After Visit Summary.                    Ambient Scribe Use Consent: This note was generated using assisted transcription with the verbal consent of the patient/guardian. The note has been reviewed and edited accordingly by me.                   Jolicia Delira I., MD  07/13/24 [phone]

## 2024-07-11 NOTE — ED Notes
 Breaking primary rn. Foley present on arrival, old foley removed and new foley placed with chaperone [place] rn at bedside. Ua and ucx obtained and sent. Pt tolerated procedure well.

## 2024-07-12 ENCOUNTER — Ambulatory Visit: Payer: TRICARE (CHAMPUS)

## 2024-07-12 ENCOUNTER — Inpatient Hospital Stay
Admission: EM | Admit: 2024-07-12 | Discharge: 2024-07-18 | Disposition: A | Discharge status: Hospice | Payer: MEDICARE | Source: Home / Self Care

## 2024-07-12 LAB — Blood Culture Detection
BLOOD CULTURE FINAL STATUS: NEGATIVE
BLOOD CULTURE FINAL STATUS: NEGATIVE
BLOOD CULTURE FINAL STATUS: NEGATIVE
BLOOD CULTURE FINAL STATUS: NEGATIVE

## 2024-07-12 LAB — Magnesium: MAGNESIUM: 1.8 meq/L (ref 1.5–2.0)

## 2024-07-12 LAB — Basic Metabolic Panel
ANION GAP: 4 mmol/L — ABNORMAL LOW (ref 8–19)
ESTIMATED GFR 2021 CKD-EPI: 50 mL/min/1.73m2 (ref 98–107)

## 2024-07-12 LAB — CBC
MCH CONCENTRATION: 31.6 g/dL (ref 31.5–35.5)
MEAN CORPUSCULAR VOLUME: 91.9 fL (ref 79.3–98.6)

## 2024-07-12 LAB — Differential Automated
ABSOLUTE LYMPHOCYTE COUNT: 2.18 x10E3/uL (ref 1.30–3.40)
ABSOLUTE MONO COUNT: 1.09 x10E3/uL — ABNORMAL HIGH (ref 0.20–0.80)

## 2024-07-12 LAB — Sepsis Lactate Protocol: BLOOD LACTATE: 8 mg/dL (ref 4–<18)

## 2024-07-12 LAB — Phosphorus: PHOSPHORUS: 3.1 mg/dL (ref 2.5–4.9)

## 2024-07-12 LAB — UA,Microscopic: WBCS HPF: >200 {cells}/[HPF] — ABNORMAL HIGH (ref 0–4)

## 2024-07-12 LAB — UA,Dipstick: GLUCOSE: NEGATIVE (ref 1.005–1.030)

## 2024-07-12 LAB — Hepatic Funct Panel: TOTAL PROTEIN: 6 g/dL — ABNORMAL LOW (ref 6.4–8.2)

## 2024-07-12 LAB — HS Troponin I (Single): HIGH SENSITIVITY TROPONIN I: 5 ng/L (ref 3–<54)

## 2024-07-12 MED ADMIN — BUSPIRONE HCL 5 MG PO TABS: 15 mg | ORAL | @ 19:00:00 | Stop: 2024-07-18 | NDC 29300024401

## 2024-07-12 MED ADMIN — PANCRELIPASE (LIP-PROT-AMYL) 5000-24000 UNITS PO CPEP: 15000 [IU] | ORAL | @ 21:00:00 | Stop: 2024-07-18 | NDC 73562011501

## 2024-07-12 MED ADMIN — ENOXAPARIN SODIUM 40 MG/0.4ML IJ SOSY: 40 mg | SUBCUTANEOUS | @ 10:00:00 | Stop: 2024-07-15 | NDC 71288043382

## 2024-07-12 MED ADMIN — VENLAFAXINE HCL ER 75 MG PO CP24: 300 mg | ORAL | @ 16:00:00 | Stop: 2024-07-18 | NDC 68084070911

## 2024-07-12 MED ADMIN — ERTAPENEM 1 G IN SWFI IVP: 1 g | INTRAVENOUS | @ 08:00:00 | Stop: 2024-07-12 | NDC 00409488717

## 2024-07-12 MED ADMIN — RIVASTIGMINE TARTRATE 1.5 MG PO CAPS: 4.5 mg | ORAL | Stop: 2024-07-18 | NDC 60687077711

## 2024-07-12 MED ADMIN — SODIUM CHLORIDE 0.9 % IV SOLN: 50 mL/h | INTRAVENOUS | @ 13:00:00 | Stop: 2024-07-13 | NDC 00338004904

## 2024-07-12 MED ADMIN — CHOLECALCIFEROL 25 MCG (1000 UT) PO TABS: 25 ug | ORAL | @ 16:00:00 | Stop: 2024-07-18

## 2024-07-12 NOTE — Consults
 Physical Therapy Evaluation      PATIENT: Jodi Bentley  MRN: [id number]  DOB: March 28, 1941    ADMIT DATE: 07/12/2024       Date of Evaluation: 07/12/2024    Problems: Active Problems:    * No active hospital problems. *       Past Medical History:   Diagnosis Date    Anxiety     Cancer (HCC/RAF)     Dementia (HCC/RAF)     Depression     Histoplasmosis     Past Surgical History:   Procedure Laterality Date    ABDOMINAL SURGERY      BREAST SURGERY          Relevant Hospital Course: Jodi Bentley is a 83 y.o. female with PMH significant for Dementia presenting with concern for significant decline in pt's function and concern for UTI.     I was unable to reach family for collateral at the time of my assessment, and thus relied on history already taken. Pt not able to participate in any significantly meaningful way.     Per ED note, the patient was weaker than normal the day of admission compared to the previous day. Pt has also experienced significant deterioration over the past 8 to 12 weeks, with an accelerated decline in her verbal communication, now being mostly nonverbal and only able to mumble. Pt has a history of recurrent UTIs and the odor of her urine made family suspect a UTI. Thus patient was brought in. (From Hospitalist H&P)    Patient Stated Goal: Unable to verbalize     Living Arrangements   Type of Home: Assisted living  Bathroom Shower/Tub: Walk-in shower  Bathroom Equipment: Grab bars in shower, Academic librarian Accessibility: Accessible  Home Equipment: Other (Comment) (uknown)    Prior Level of Function   Level of Independence: Assisted, Household ambulation, Functional transfers  Lives With: Caregiver  Support Available: Personal care attendant Cabin crew)  # of hours available: 24  ADL Assistance: Needs assistance  Vision: Unable to assess  Hearing: Unable to assess  Transportation: Driven by Others    Precautions   Precautions: Fall risk;Aspiration  Orthotic: None  Current Activity Order: Activity as tolerated  Weight Bearing Status: Not Applicable    GENERAL EVALUATION   Position: In bed;Sitter present;Hand mittens  Lines/devices Drains: HLIV  B/B Devices: Foley cath     Bed Mobility   Supine Scooting: Dependent  Rolling: Maximum Assist;Dependent  Supine to Sit: Not Performed (patient unable to follow commands to safely perform transition.)    UE Assessment   R UE Assessment: Other (Comment) (unable to assess due to patient inability to follow commands)  L UE Assessment: Other (Comment) (unable to assess due to patient inability to follow commands)     LE Assessment   RLE Assessment: Within Functional Limits         LLE Assessment: Within Functional Limits         Sensation   Sensation: Unable to assess    Cognition   Cognition: Exceptions to WDL  Arousal/Alertness: Delayed responses to stimuli  Attention Span: Difficulty attending to directions  Memory: Unable to assess  Orientation Level: Oriented to person (arouses intermittently to name)  Following Commands: Does not follow commands  Safety Awareness: Decreased awareness of need for assistance;Poor awareness of safety precautions  Barriers to Learning: Altered Mental Status;Cognitive Limitations    Neurological Evaluation (if indicated)   Neuro Deficits: No    Pain Assessment  Patient complains of pain: Yes  Pain Quality: Patient unable to describe  Pain Scale Used: Advanced Dementia  Pain Location: Left;Hip    PAINAD (Pain Assessment in Advanced Dementia)  Breathing: normal  Negative Vocalization: occasional moan/groan, low speech, negative/disapproving quality  Facial Expression: facial grimacing  Body Language: relaxed  Consolability: no need to console  PAINAD Score: 3    Patient Status   Activity Tolerance: Poor  Oxygen Needs: Room Air  Response to Treatment: Vital signs stable;with activity;Nursing notified;Pain  Compliance with Precautions: Not Tested  Call light in reach:  (sitter present)  Presentation post treatment: Hand mittens on;Sitter present;In bed;Side rails up;Lines/drains intact    Interdisciplinary Communication   Interdisciplinary Communication: Nurse    ASSESSMENT   Rehab Potential: Poor  Inpatient Recommendation: PT treatment  Problem List: Decreased bed mobility;Decreased transfers;Decreased gait;Decreased strength;Decreased endurance;Decreased range of motion;Impaired balance;Decreased activity tolerance;Fall risk;Decreased knowledge of precautions;Need for caregiver/family education;Discharge needs;Decreased knowledge of exercise program  Present During Evaluation: Other reduced mobility (Z74.09)  Treatment Plan: Bed mobility training;Transfer training;Gait training;Therapeutic exercise;Range of motion;Coordinate with nurse to pre-medicate patient as needed;Balance training;Patient and/or family education;Caregiver education;Coordinate pain management with team;Discharge planning;Instruction on donning/doffing brace/orthotic;Education on precautions;Neuromuscular re-education;Home program  Frequency: 3-5 x/week  Duration (days): 30  Progress Note Due Date: 07/19/24  I agree with the contents of: Evaluation;Treatment Plan and Goals    Goals Discussed With: Patient    Short Term Goals to be achieved in: 7 days  Pt will perform bed mobility: with moderate assist  Pt will perform supine to sit: with moderate assist  Pt will perform sit to stand: with maximum assist;with FWW  Pt will transfer to/from bed/chair: with maximum assist;with FWW    Long Term Goals to be achieved in: 30 days  Pt will perform bed mobility: with minimum assist  Pt will perform supine to sit: with minimum assist  Pt will perform sit to stand: with minimum assist;with FWW  Pt will transfer to/from bed/chair: with minimum assist;with FWW  Pt will ambulate: with minimum assist;31-50 feet;with FWW;with verbal cues    PT Recommendations   Discharge Recommendation: Physical Therapy;Would benefit from continued therapy  Discharge concerns: Requires assistance for self care;Requires assistance for mobility  Discharge Equipment Recommended: Owns recommended DME  Comments: moderate intensity    AM-PAC   AM-PAC Basic Mobility Raw Score: 7  AM-PAC Basic Mobility t-Scale Score: 19.39  AM-PAC Basic Mobility CMS 0-100% Score: 93.19 %    Modified Rankin Scale   mRS: Severe Disability    Evaluation Completed by: Prentice Marsha Mae, PT,  07/12/2024

## 2024-07-12 NOTE — Nursing Note
 Attempted to notify Wolfgang Ade and Paulett Pulling regarding patient's admission to the 6th floor, No answer, awaiting call back.

## 2024-07-12 NOTE — ED Notes
 Report given to Tim,RN.

## 2024-07-12 NOTE — Progress Notes
 [facility]  HOSPITALIST PROGRESS NOTE      For questions from 7am to 5pm, please message me on Epic Chat.     After 5 PM PST, PAGE [phone] for [facility] patients        Patient Name: Jodi Bentley     DOB: 1941/05/11  MRN: [id number]  PCP: Lajean Ulysses Chiquita CHRISTELLA., MD Admission: 07/12/2024    Date of Service: 07/12/2024  Hospital Day: Hospital Day: 2       Principle Problem: <principal problem not specified>    Chief Complaint: Generalized Weakness (BIBA FROM THE [name], CAREGIVER NOTICED THAT SHE WAS WEAKER THAN NORMAL AROUND 4PM. PT IS NORMALLY ABLE TO FEED SELF AND WAS UNABLE TO THIS AFTERNOON. SHE IS DNR/DNI. HX DEMENTIA)    SUBJECTIVE/INTERVAL EVENTS     ID: Jodi Bentley is a 83 y.o. female with PMH significant for Dementia presenting with concern for significant decline in pt's function and concern for UTI.     9/21  - vitals unremarkable, afebrile, on 2L NC though satting 98-100  - bld cx NGTD  - urine cx in process  - WBC 12.8 -> 12.5  - alk phos mildly high at 153, albumin low at 2.9  - UA is positive with > 1000 WBC  - foley catheter exchanged in the ER  - previously on hospice care, POLST reviewed, DNR    - continue ertapenem       Subjective (click to expand/collapse)      Per admit H&P:  ''  [name] is a 83 y.o. female with PMH significant for Dementia presenting with concern for significant decline in pt's function and concern for UTI.     I was unable to reach family for collateral at the time of my assessment, and thus relied on history already taken. Pt not able to participate in any significantly meaningful way.     Per ED note, the patient was weaker than normal the day of admission compared to the previous day. Pt has also experienced significant deterioration over the past 8 to 12 weeks, with an accelerated decline in her verbal communication, now being mostly nonverbal and only able to mumble. Pt has a history of recurrent UTIs and the odor of her urine made family suspect a UTI. Thus patient was brought in.     Pt's code status was confirmed DNR/DNI with my conversations with ED staff.     ED Course:  Basic labs acquired  UA + Ucx  Ertapenem  1g x1  Lovenox  40mg    Admitted ''          ASSESSMENT/PLAN       # UTI  # Hx ESBL E Coli  Pt with UA strongly suggestive of UTI, with leukocytosis but no other SIRS criteria. Previous Ucx in 04/2024 grew ESBL E Coli.  - f/u Bcx, Ucx  - s/p Ertapenem  1g (9/21); c/w Ertapenem  1g (9/21-) x7d for ESBL UTI  - IVF iso concern for possible aspiration  - f/u SLP  - aspiration precautions  - needs med rec     Chronic, Stable Problems:  # Dementia  Unclear home regimen  - Melatonin at bedtime and Zyprexa  PRN for agitation  - re-start home meds once med rec complete     PLAN:  Treat UTI     No diet orders on file  VTE Prophylaxis: Lovenox   GI Prophylaxis: None  Tubes/Drains: NOne     Activity: Rest  Isolation: None  Precautions: Aspiration  Monitoring: None      CHECKLIST:     DNR (No CPR, defibrillation, intubation),  Primary Emergency Contact: [name],[name], Home Phone: [phone], Mobile Phone: [phone]    Diet: No diet orders on file    VTE Prophylaxis: Prophylactic Lovenox     LDA: Urethral Catheter Standard 16 Fr. (1)  Peripheral IV 22 G Left Antecubital (1)       Disposition:  Inpatient, Expected post-hospitalization disposition will be to Home.    OBJECTIVE         Objective (click to expand/collapse)     Scheduled medications   enoxaparin   40 mg Subcutaneous Q24H    [START ON 07/13/2024] ertapenem  IV  1 g Intravenous Q24H       Continuous medications   sodium chloride  50 mL/hr (07/12/24 0605)       PRN medications:   acetaminophen     Vitals Current 24 Hour Min / Max      Temp    36.5 ?C (97.7 ?F)    Temp  Min: 36.5 ?C (97.7 ?F)  Max: 36.9 ?C (98.4 ?F)      BP     141/69     BP  Min: 141/69  Max: 176/69      HR    83    Pulse  Min: 78  Max: 83      RR    16    Resp  Min: 16  Max: 22      SpO2    99 %     SpO2  Min: 98 %  Max: 100 %      Weight    59 kg (130 lb)     Admit: 59 kg (130 lb)     No intake or output data in the 24 hours ending 07/12/24 0859      PHYSICAL EXAM:    System Normal Findings Abnormal Findings   General []  Normal general appearance   []  In acute distress  [x]  Chronically ill appearing  []  Sedated           []  Non Verbal  []  Other:   HENMT/Neck []  Oropharynx clear and moist  [x]  Normal JVP []  Dry mucous membranes  []  JVP ( cm)  []  Other:   Resp [x]  CTA B/L []  Intubated                 []  On Supp O2 ()  []  Wheezing ()  []  Rales ()   []  Crackles ()  []  Other:   CV [x]  Normal rhythm/rate  [x]  No murmurs  []  No lower extremity edema  []  Normal PMI   Normal pulses:   []  Radial []  Femoral  []  Pedal []   Abnormal rhythm ()  []   Murmur ()  []  S3    []  S4  Abnormal Pulses:  []  Radial ()  []  Femoral ()   []  DP ()  []  PT ()  []  Lower extremity edema       []  R ()   []  L ()  []  Bilateral ()  []  Other:   GI [x]  Soft  []  No tenderness to palpation  []  No rebound/guarding []  Tenderness ()  []  Fluid Wave  []  Hepatomegaly  Other:   MSK []  Normal strength/tone []  Other:    Skin []  No rash []  Other:   Neuro []  AO x 3  []  Normal strength  []  Normal sensation  []  Normal reflexes  []  Normal cerebellar exam [  x] AO x 0       []  AO x 1     []  AO x 2  Abnormal neuro exam:  []  Abnormal strength  []  Abnormal sensation  []  Abnormal reflexes  []  Abnormal cerebellar exam       Labs    CBC  Recent Labs     07/12/24  0643 07/11/24  2014   WBC 12.56* 12.83*   HGB 10.9* 10.8*   HCT 34.5* 33.1*   MCV 93.2 91.9   PLT 302 295       Coags  No results for input(s): ''INR'', ''PT'', ''APTT'' in the last 72 hours.    Blood gas  No results for input(s): ''PHART'', ''PCO2ART'', ''PO2ART'', ''BICARBART'', ''BEART'' in the last 72 hours.    BMP  Recent Labs     07/12/24  0643 07/11/24  2014   NA 140 142   K 3.8 3.7   CL 109* 109*   CO2 29 29   BUN 22* 24*   CREAT 1.10 1.30   CALCIUM  8.9 8.6   MG 1.8  --    PHOS 3.1  --        LFT  Recent Labs     07/12/24  0643   TOTPRO 6.0*   ALBUMIN 2.9*   BILITOT 0.5 BILICON <0.1   ALT 19   AST 18   ALKPHOS 153*         Micro    Recent Results (from the past 4 weeks)   Bacterial Culture Blood    Collection Time: 07/11/24  9:58 PM    Specimen: Peripheral Vein; Blood   Result Value Ref Range    Blood Culture - Preliminary status Negative To Date Negative   Bacterial Culture Blood    Collection Time: 07/11/24 10:24 PM    Specimen: Peripheral Vein #2; Blood   Result Value Ref Range    Blood Culture - Preliminary status Negative To Date Negative         Radiology    No results found.                  AUTHOR:  Camellia BIRCH. Rojelio, MD  07/12/2024 at 8:59 AM

## 2024-07-12 NOTE — Consults
 Speech Language Pathology  Bedside Swallow Study    PATIENT: Jodi Bentley  MRN: [id number]  DOB: 04-Mar-1941    ADMIT DATE: 07/12/2024       Date of Evaluation: 07/12/2024  Evaluation Start Time: 10:05    Problems: Active Problems:    * No active hospital problems. *       Past Medical History:   Diagnosis Date    Anxiety     Cancer (HCC/RAF)     Dementia (HCC/RAF)     Depression     Histoplasmosis     Past Surgical History:   Procedure Laterality Date    ABDOMINAL SURGERY      BREAST SURGERY          Patient Status     Background Relevant to Treatment Diagnosis: 83yoF admitted for  weakness and concern for UTI. PMH signficant for dementia and recurrent UTIs.    Previous Level of Function: No information is available  Nutrition/Hydration Intake: NPO  Pulmonary Status: Chest X-ray  CXR: Pending    RN Hadassah cleared pt for ST eval. Pt received asleep, however awoke to verbal cues. Pt unable to follow commands despite max cues. NC in place.    Pain Assessment     Patient complains of pain: No    Materials Administered     Materials Administered: Thin liquid;Puree    Pt. Position During Exam     Pt. Position During Exam: Upright in bed      Oral Peripheral Exam     Oral Peripheral Exam: Unable to assess secondary to altered mental status    Swallowing Performance     Comment: Thin liquids 4oz via tsp, cup, straw: oral holding <5 seconds, no overt s/s of aspiration. Puree: prolonged bolus manipulation, adequate bolus clearance, no overt s/s of aspiration. Harder solids deferred 2/2 significant cognitive impairment and increased risk for aspiration events    Summary     Pt presents with mild oral phase impairment likely chronic r/t h/o dementia. No clinical s/s of pharyngeal phase dysphagia. Solid modificaiton is indicated. Recommend puree, thin liquids with aspiration precautions as outlined below. Swallow function likely at baseline. No further SLP intervention is warranted at this time.    Recommendations     PO Intake - Diet Consistency: Puree  PO Intake - Liquid Consistency: Thin  Recommendations: Aspiration precautions;No further Swallowing Assessment indicated  Aspiration Precautions: Upright for eating and drinking;Eat and drink slowly;Small mouthfuls of food and liquid at a time;Stop PO intake if coughing and choking occurs;Other (Comment) (1:1 feeding, only feed pt when fully alert)      Evaluation Completed by: Angela Richerd Odis Alm, SLP,  07/12/2024

## 2024-07-12 NOTE — H&P
 HOSPITALIST HISTORY AND PHYSICAL     DATE OF SERVICE: 07/12/2024  ADMISSION DATE:       ADMITTING FACILITY:  [facility] Emergency Department  [address]  [place] [address]  Phone: [phone]    ADMITTING TEAM: Medicine Hospitalist  ATTENDING PHYSICIAN: Leonides Ismael FERNS., MD  PMD: Lajean Colorado, Chiquita HERO., MD    CC: Generalized Weakness (BIBA FROM THE [name], CAREGIVER NOTICED THAT SHE WAS WEAKER THAN NORMAL AROUND 4PM. PT IS NORMALLY ABLE TO FEED SELF AND WAS UNABLE TO THIS AFTERNOON. SHE IS DNR/DNI. HX DEMENTIA)    HPI:   Jodi Bentley is a 83 y.o. female with PMH significant for Dementia presenting with concern for significant decline in pt's function and concern for UTI.    I was unable to reach family for collateral at the time of my assessment, and thus relied on history already taken. Pt not able to participate in any significantly meaningful way.    Per ED note, the patient was weaker than normal the day of admission compared to the previous day. Pt has also experienced significant deterioration over the past 8 to 12 weeks, with an accelerated decline in her verbal communication, now being mostly nonverbal and only able to mumble. Pt has a history of recurrent UTIs and the odor of her urine made family suspect a UTI. Thus patient was brought in.    Pt's code status was confirmed DNR/DNI with my conversations with ED staff.    ED Course:  Basic labs acquired  UA + Ucx  Ertapenem  1g x1  Lovenox  40mg    Admitted    ED COURSE:  ED Triage Vitals [07/11/24 1929]   Temp Temp Source BP Heart Rate Resp SpO2 O2 Device Pain Score Weight   36.6 ?C (97.9 ?F) Oral 159/70 78 18 98 % None (Room air) Zero 59 kg (130 lb)       REVIEW OF SYSTEMS:  A complete review of 14 systems was performed. Additional symptoms were otherwise negative and/or non-contributory, except as listed above.    PREVIOUS RECORDS:  Old records and labs reviewed by physician. and I have reviewed the patient's prior records in [facility], and incorporated details as relevant in the HPI and note as below.    PAST MEDICAL HISTORY:  She has a past medical history of Anxiety, Cancer (HCC/RAF), Dementia (HCC/RAF), Depression, and Histoplasmosis.    PAST SURGICAL HISTORY:  She has a past surgical history that includes Breast surgery and Abdominal surgery.    SOCIAL HISTORY:  She reports that she has quit smoking. Her smoking use included cigarettes. She has been exposed to tobacco smoke. She has never used smokeless tobacco. She reports that she does not currently use alcohol. She reports that she does not use drugs.    FAMILY HISTORY:  Her family history is not on file.    ALLERGIES:  Ace inhibitors, Sulfasalazine, and Sulfa antibiotics    MEDICATIONS:  No outpatient medications have been marked as taking for the 07/11/24 encounter [facility] Encounter).       VITALS:  Temp:  [36.5 ?C (97.7 ?F)-36.9 ?C (98.4 ?F)] 36.5 ?C (97.7 ?F)  Heart Rate:  [78-80] 80  Resp:  [18-19] 19  BP: (144-168)/(70-113) 144/74  NBP Mean:  [91] 91  SpO2:  [98 %-100 %] 98 %     Weight: 59 kg (130 lb) Oxygen Therapy  SpO2: 98 %  O2 Device: Nasal cannula  Flow Rate (L/min): 2 L/min  No intake/output data recorded.    PHYSICAL EXAM:  Const: well-developed, well-nourished female in no acute distress  Eyes: lids and conjunctiva normal, sclera anicteric, PERRL  HENT: atraumatic, moist mucus membranes, no oropharyngeal lesions  Neck: trachea midline, no thyromegaly appreciated, no cervical or supraclavicular LAD appreciated  CV: regular rate and rhythm, normal S1/S2, no m/r/g, no peripheral edema, 2+ radial and DP pulses  Resp: normal work of breathing, CTAB without wheezes, rhonchi, or crackles  GI: normoactive bowel sounds, non-distended, soft, non-tender, no masses or hepatosplenomegaly appreciated  MSK: no joint erythema, warmth, or effusion noted, no clubbing or cyanosis  Skin: no rashes or suspicious skin lesions noted, skin warm to touch  Neuro: PERRLA, no focal deficits noted; pt mumbling, unable to follow commands or interact in a meaningful way  Psych: Alter, not oriented    LABS:  I have reviewed the clinically relevant labs.  CBC:  Recent Labs     07/11/24  2014   WBC 12.83*   NEUTABS 9.18*   HGB 10.8*   HCT 33.1*   MCV 91.9   PLT 295     BMP:  Recent Labs     07/11/24  2014   NA 142   K 3.7   CL 109*   CO2 29   BUN 24*   CREAT 1.30   GLUCOSE 98   CALCIUM  8.6     LFT:  No results for input(s): ''TOTPRO'', ''ALBUMIN'', ''BILITOT'', ''BILICON'', ''ALT'', ''AST'', ''ALKPHOS'', ''GGT'', ''AMYLASE'', ''LIPASE'' in the last 72 hours.  COAGS:  No results for input(s): ''INR'', ''PT'', ''APTT'' in the last 72 hours.  UA:  Recent Labs     07/11/24  2209   SPECGRAVUR 1.022   PHUR 6.5   BLDUR 2+*   KETONESUR Negative   GLUCOSEUR Negative   PROTCLUR 1+*   NITRITEUR 2+*   LEUKESTUR 3+*   RBCSUR 669*   WBCSUR >1,000*        IMAGING:  I have reviewed the clinically relevant imaging.  No imaging has been resulted in the last 30 days       STUDIES:  I have reviewed the clinically relevant studies.    ECG 07/12/2024 (personally reviewed by me; my findings are): NSR    ASSESSMENT:  Jodi Bentley is a 83 y.o. female with PMH significant for Dementia presenting with concern for significant decline in pt's function, admitted with concern for UTI.    # UTI  # Hx ESBL E Coli  Pt with UA strongly suggestive of UTI, with leukocytosis but no other SIRS criteria. Previous Ucx in 04/2024 grew ESBL E Coli.  - f/u Bcx, Ucx  - s/p Ertapenem  1g (9/21); c/w Ertapenem  1g (9/21-) x7d for ESBL UTI  - IVF iso concern for possible aspiration  - f/u SLP  - aspiration precautions  - needs med rec    Chronic, Stable Problems:  # Dementia  Unclear home regimen  - Melatonin at bedtime and Zyprexa  PRN for agitation  - re-start home meds once med rec complete    PLAN:  Treat UTI    No diet orders on file  VTE Prophylaxis: Lovenox   GI Prophylaxis: None  Tubes/Drains: NOne    Activity: Rest  Isolation: None  Precautions: Aspiration  Monitoring: None    CODE STATUS:  Prior, Primary Emergency Contact: [name],[name], Home Phone: [phone]    DISPOSITION:  Admit to inpatient. I expect the patient will need to be hospitalized for at least two midnight based on the  issues discussed above. Expected post-hospitalization disposition will be to home.    75 minutes were spent personally by me today on this encounter which include today's pre-visit review of the chart, obtaining appropriate history, performing an evaluation, documentation and discussion of management with details supported within the note for today's visit. The time documented was exclusive of any time spent on the separately billed procedure.    AUTHOR:  Dulcey Riederer R. March, MD  07/12/2024 at 2:43 AM    CC:  Lajean Colorado, Chiquita HERO., MD  [phone] [facility] [address] / [place] [address]  Office: [phone]  Fax: [phone]

## 2024-07-13 DIAGNOSIS — Z8659 Personal history of other mental and behavioral disorders: Secondary | ICD-10-CM

## 2024-07-13 DIAGNOSIS — R4701 Aphasia: Secondary | ICD-10-CM

## 2024-07-13 DIAGNOSIS — R4182 Altered mental status, unspecified: Secondary | ICD-10-CM

## 2024-07-13 DIAGNOSIS — R531 Weakness: Secondary | ICD-10-CM

## 2024-07-13 LAB — Bacterial Culture Urine: BACTERIAL CULTURE URINE: >100000 — AB

## 2024-07-13 MED ADMIN — BUSPIRONE HCL 5 MG PO TABS: 15 mg | ORAL | @ 04:00:00 | Stop: 2024-07-18 | NDC 29300024401

## 2024-07-13 MED ADMIN — ENOXAPARIN SODIUM 40 MG/0.4ML IJ SOSY: 40 mg | SUBCUTANEOUS | @ 09:00:00 | Stop: 2024-07-15 | NDC 71288043382

## 2024-07-13 MED ADMIN — SODIUM CHLORIDE 0.9 % IV SOLN: 50 mL/h | INTRAVENOUS | @ 21:00:00 | Stop: 2024-07-13

## 2024-07-13 MED ADMIN — PANCRELIPASE (LIP-PROT-AMYL) 5000-24000 UNITS PO CPEP: 15000 [IU] | ORAL | @ 01:00:00 | Stop: 2024-07-18 | NDC 73562011501

## 2024-07-13 MED ADMIN — PANCRELIPASE (LIP-PROT-AMYL) 5000-24000 UNITS PO CPEP: 15000 [IU] | ORAL | @ 16:00:00 | Stop: 2024-07-18 | NDC 73562011501

## 2024-07-13 MED ADMIN — BUSPIRONE HCL 5 MG PO TABS: 15 mg | ORAL | @ 19:00:00 | Stop: 2024-07-18 | NDC 29300024401

## 2024-07-13 MED ADMIN — PANTOPRAZOLE SODIUM 40 MG PO TBEC: 40 mg | ORAL | @ 13:00:00 | Stop: 2024-07-18 | NDC 60687073609

## 2024-07-13 MED ADMIN — CHOLECALCIFEROL 25 MCG (1000 UT) PO TABS: 25 ug | ORAL | @ 16:00:00 | Stop: 2024-07-18

## 2024-07-13 MED ADMIN — ERTAPENEM 1 G IN SWFI IVP: 1 g | INTRAVENOUS | @ 08:00:00 | Stop: 2024-07-15 | NDC 60505619600

## 2024-07-13 MED ADMIN — OLANZAPINE 10 MG IM SOLR: 5 mg | INTRAMUSCULAR | @ 09:00:00 | Stop: 2024-07-13 | NDC 00781315972

## 2024-07-13 MED ADMIN — VENLAFAXINE HCL ER 75 MG PO CP24: 300 mg | ORAL | @ 16:00:00 | Stop: 2024-07-18 | NDC 68084070911

## 2024-07-13 MED ADMIN — BUSPIRONE HCL 5 MG PO TABS: 15 mg | ORAL | @ 13:00:00 | Stop: 2024-07-18 | NDC 29300024401

## 2024-07-13 MED ADMIN — QUETIAPINE FUMARATE 25 MG PO TABS: 12.5 mg | ORAL | @ 04:00:00 | Stop: 2024-07-18 | NDC 60687032711

## 2024-07-13 MED ADMIN — PANCRELIPASE (LIP-PROT-AMYL) 5000-24000 UNITS PO CPEP: 15000 [IU] | ORAL | @ 19:00:00 | Stop: 2024-07-18 | NDC 73562011501

## 2024-07-13 MED ADMIN — SODIUM CHLORIDE 0.9 % IV SOLN: 50 mL/h | INTRAVENOUS | @ 11:00:00 | Stop: 2024-07-13 | NDC 00338004904

## 2024-07-13 MED ADMIN — RIVASTIGMINE TARTRATE 1.5 MG PO CAPS: 4.5 mg | ORAL | @ 13:00:00 | Stop: 2024-07-18 | NDC 60687077711

## 2024-07-13 NOTE — Progress Notes
 Pharmaceutical Services - Admission Medication Reconciliation Note      Patient Name: Jodi Bentley  Medical Record Number: [id number]  Admit date: 07/12/2024 2:46 AM    Age: 83 y.o.  Sex: female  Allergies:   Allergies   Allergen Reactions    Ace Inhibitors Cough     Other reaction(s): Other (See Comments)  cough  cough  cough      Sulfasalazine     Sulfa Antibiotics Rash and Other (See Comments)     Height:   Most recent documented height   07/12/24 1.727 m (5' 8'')     Actual Weight:   Most recent documented weight   07/12/24 53.1 kg   05/11/24 59 kg     Diagnosis: The patient is currently admitted with the following concerns/issues: Active Problems:    * No active hospital problems. *      Reported Medication History   I used the facility [facility] via fax to update the home medication list for this hospital admission.    PTA Medication List (discrepancies are noted)   Medications Prior to Admission   Medication Sig Last Dose/Taking    Acetaminophen  325 MG CAPS Take 650 mg by mouth every four (4) hours as needed for Pain or Fever (T>38.5C or 101.720F) (mild to moderate pain). Taking As Needed    acetaminophen  650 mg suppository Place 1 suppository (650 mg total) rectally every four (4) hours as needed for Fever (T>38.5C or 101.720F). Taking As Needed    amLODIPine  10 mg tablet Take 10 mg once daily as needed for systolic blood pressure above 839 mmHg. (Patient taking differently: Take 1 tablet (10 mg total) by mouth daily as needed (SBP > 160).) Taking Differently    bisacodyl 10 mg suppository Place 1 suppository (10 mg total) rectally daily as needed for Constipation. Taking As Needed    busPIRone  15 mg tablet Take 1 tablet (15 mg total) by mouth three (3) times daily. Taking    busPIRone  15 mg tablet Take 1 tablet (15 mg total) by mouth daily as needed (anxiety/agitation). Taking As Needed    haloperidol 2 mg/mL solution Take 0.5 mLs (1 mg total) by mouth every four (4) hours as needed (agitation). Taking As Needed HYDROcodone-acetaminophen  5-325 mg tablet Take 1 tablet by mouth every four (4) hours as needed for Moderate Pain (Pain Scale 4-6) or Severe Pain (Pain Scale 7-10). Taking As Needed    hyoscyamine 0.125 mg tablet Take 1 tablet (125 mcg total) by mouth every four (4) hours as needed for Cramping. Taking As Needed    ipratropium-albuterol 0.5-2.5 mg/3 mL inhalation solution Inhale 3 mLs every four (4) hours as needed for Wheezing or Shortness of Breath. Taking As Needed    lidocaine  5% patch APPLY 1 PATCH TOPICALLY DAILY (12 HOURS ON AND 12 HOURS OFF) TO LOWER BACK (Patient taking differently: Place 1 patch onto the skin daily.) Taking Differently    loperamide 2 mg capsule Take 1 capsule (2 mg total) by mouth every six (6) hours as needed for Diarrhea. Taking As Needed    loratadine  10 mg tablet TAKE 1 TABLET BY MOUTH DAILY DX: ALLERGIES (Patient taking differently: Take 1 tablet (10 mg total) by mouth daily.) Taking Differently    LORazepam 0.5 mg tablet Place 1 tablet (0.5 mg total) under the tongue every four (4) hours as needed for Anxiety. Max Daily Amount: 3 mg Taking As Needed    LORazepam 2 mg/mL concentrated solution Take 0.25 mLs by  mouth every four (4) hours as needed for Anxiety. Max Daily Amount: 1.5 mLs Taking As Needed    mineral oil-hydrophilic petrolatum (AQUAPHOR) ointment Apply 1 g topically two (2) times daily. Apply to feet Taking    morphine 100 mg/5 mL SOLN concentrated solution Take 0.25 mLs by mouth every hour as needed for Pain. Max Daily Amount: 6 mLs Taking As Needed    ondansetron ODT 4 mg disintegrating tablet Take 1 tablet (4 mg total) by mouth every eight (8) hours as needed for Nausea or Vomiting. Taking As Needed    pancrelipase , Lip-Prot-Amyl, (CREON ) 36000 units DR capsule Take 1 capsule (36,000 units of lipase total) by mouth three (3) times daily with meals. Taking    pantoprazole  40 mg DR tablet TAKE 1 TABLET BY MOUTH DAILY GIVE AT 6:30AM BEFORE BREAKFAST (DO NOT CRUSH) FOR DX: GERD (Patient taking differently: Take 1 tablet (40 mg total) by mouth every morning before breakfast.) Taking Differently    prochlorperazine 25 mg suppository Place 1 suppository (25 mg total) rectally every twelve (12) hours as needed for Nausea or Vomiting. Taking As Needed    QUEtiapine  25 mg tablet Take 0.5 tablets (12.5 mg total) by mouth at bedtime. (Patient taking differently: Take 1 tablet (25 mg total) by mouth two (2) times daily.) Taking Differently    rivastigmine  4.5 mg capsule Take 1 capsule (4.5 mg total) by mouth two (2) times daily before meals. Taking    UNKNOWN TO PATIENT Apply 1 g topically every six (6) hours as needed (agitation). C-ABH GEL Taking As Needed    venlafaxine  150 mg 24 hr capsule Take 2 capsules (300 mg total) by mouth daily. Taking    vitamin D, cholecalciferol , 25 mcg (1000 units) tablet TAKE 1 TABLET BY MOUTH DAILY FOR DX: SUPPLEMENT (Patient taking differently: Take 1 tablet (25 mcg total) by mouth daily.) Taking Differently    Vitamins A & D (VITAMIN A & D) ointment Apply 1 g topically as needed for for Dry Skin. Taking As Needed    Zinc Oxide (BALMEX) 11.3 % CREA Apply 1 Application topically as needed for (incontinence). Taking As Needed       Discharge Prescription Preference:   [facility] - [place], [place] - [address]  [address]  [place] [address]        The patient's allergies and medications have been reviewed and updated.     The reconciliation of admission orders with PTA med list is complete.     Yancy Deems, PharmD, 07/13/2024, 1:16 PM

## 2024-07-13 NOTE — Progress Notes
 07/13/24 1552   Time Calculation   Encounter? Yes   Start Time 1543   Last PT Treatment Date 07/13/24   No Indication for Acute PT Unable to participate in skilled therapy  (Patient unable to follow commands. Unable to participate in skilled therapy. Please re-consult once patient able to effectively participate with therapy.)   Chart accessed for Treatment scheduling or assignment

## 2024-07-13 NOTE — Other
 Neuro: Exceptions to WDL   BMAT: Level 1 - Red  Assistive Device: Air TAP    Vitals:    Temp:  [36.3 ?C (97.3 ?F)-37.1 ?C (98.7 ?F)] 36.3 ?C (97.3 ?F)  Heart Rate:  [78-89] 87  Resp:  [16-37] 16  BP: (115-165)/(54-77) 145/77  NBP Mean:  [78-100] 100  SpO2:  [94 %-100 %] 98 %  Cardiac:  Normal sinus rhythm  Ectopy:   Respiratory:  None (Room air)     2 L/min  Pain:    Last BM: 07/13/24  GU: Indwelling urinary catheter  Skin: Within Defined Limits  Lines/Tubes/Drains:   Urethral Catheter Standard 16 Fr. (2)  Peripheral IV 22 G Left;Posterior Hand (1)       Shift Events/Acute Changes:   1x Xyprexa given     To Do/Pending:  Med list from family     Nursing Considerations for Discharge:       Expected Discharge Disposition:       Expected Discharge Date:

## 2024-07-13 NOTE — Progress Notes
 Physical Therapy  Discharge Summary    PATIENT: Jodi Bentley  MRN: [id number]  DOB: [birth year]      Date:  07/13/2024   Therapist: Prentice Marsha Mae, PT          Patient has been seen for:  Discharge planning;Bed mobility training    Objective     See Daily Progress Notes for functional levels          Assessment     Goals met: No    Reason Goal(s) Not Met: Altered mental status         Goals:  Short Term Goals to be achieved in: 7 days  Pt will perform bed mobility: with moderate assist  Pt will perform supine to sit: with moderate assist  Pt will perform sit to stand: with maximum assist, with FWW  Pt will transfer to/from bed/chair: with maximum assist, with FWW    Continue present treatment plan: No         Please reorder when patient is appropriate to restart program;Discontinue PT at this time (Comment)    Updated Discharge Recommendations:  Discharge Recommendation: Physical Therapy;Would benefit from continued therapy  Discharge concerns: Requires assistance for self care;Requires assistance for mobility  Discharge Equipment Recommended: Owns recommended DME  Comments: pending improvement in ability to participate with therapy to ascertain appropriate discharge rec    AM-PAC   AM-PAC Basic Mobility Raw Score: 6  AM-PAC Basic Mobility t-Scale Score: 16.59  AM-PAC Basic Mobility CMS 0-100% Score: 100 %    Modified Rankin Scale   mRS: Severe Disability

## 2024-07-13 NOTE — Progress Notes
 Jodi Bentley  HOSPITALIST PROGRESS NOTE      For questions from 7am to 5pm, please message me on Epic Chat.     After 5 PM PST, PAGE [phone] for [facility] patients        Patient Name: Jodi Bentley     DOB: 02/21/1941  MRN: [id number]  PCP: Lajean Ulysses Chiquita CHRISTELLA., MD Admission: 07/12/2024    Date of Service: 07/13/2024  Hospital Day: Hospital Day: 3       Principle Problem: <principal problem not specified>    Chief Complaint: Generalized Weakness (BIBA FROM THE [name], CAREGIVER NOTICED THAT SHE WAS WEAKER THAN NORMAL AROUND 4PM. PT IS NORMALLY ABLE TO FEED SELF AND WAS UNABLE TO THIS AFTERNOON. SHE IS DNR/DNI. HX DEMENTIA)    SUBJECTIVE/INTERVAL EVENTS     ID: Jodi Bentley is a 83 y.o. female with PMH significant for Dementia presenting with concern for significant decline in pt's function and concern for UTI.     9/22  -Aox2  -UCx prelim 100K Proteus/ Lactose pos GNR  -Cont Ertapenem      9/21  - vitals unremarkable, afebrile, on 2L NC though satting 98-100  - bld cx NGTD  - urine cx in process  - WBC 12.8 -> 12.5  - alk phos mildly high at 153, albumin low at 2.9  - UA is positive with > 1000 WBC  - foley catheter exchanged in the ER  - previously on hospice care, POLST reviewed, DNR  - continue ertapenem       Subjective (click to expand/collapse)      Per admit H&P:  ''  Jodi Bentley is a 83 y.o. female with PMH significant for Dementia presenting with concern for significant decline in pt's function and concern for UTI.     I was unable to reach family for collateral at the time of my assessment, and thus relied on history already taken. Pt not able to participate in any significantly meaningful way.     Per ED note, the patient was weaker than normal the day of admission compared to the previous day. Pt has also experienced significant deterioration over the past 8 to 12 weeks, with an accelerated decline in her verbal communication, now being mostly nonverbal and only able to mumble. Pt has a history of recurrent UTIs and the odor of her urine made family suspect a UTI. Thus patient was brought in.     Pt's code status was confirmed DNR/DNI with my conversations with ED staff.     ED Course:  Basic labs acquired  UA + Ucx  Ertapenem  1g x1  Lovenox  40mg    Admitted ''          ASSESSMENT/PLAN       # UTI  # Hx ESBL E Coli  Pt with UA strongly suggestive of UTI, with leukocytosis but no other SIRS criteria. Previous Ucx in 04/2024 grew ESBL E Coli. Bcx no growth. Ucx prelim Proteus and GNR 100k. SLP recs for Puree and thin liquids.  - s/p Ertapenem  1g (9/21); c/w Ertapenem  1g (9/21-) x7d for ESBL UTI  - IVF iso concern for possible aspiration  - aspiration precautions     Plan:  Treat UTI    Chronic, Stable Problems:  # Dementia  Unclear home regimen  - Melatonin at bedtime and Zyprexa  PRN for agitation  -Cont PTA Quetiapine  12.5mg  nightly  -Cont PTA Rivastigmine  4.5mg  2 times daily before meals  -Cont PTA Venlafaxine  300mg  daily  -  Cont PTA Buspirone  15mg  TID     #Incontinence Associated Dermatitis and pressures heel erythema  Wound care recs appreciated for sacrococcyx, gluteal, perineal, and vaginal dermatitis     Puree and Thin Liquids  VTE Prophylaxis: Lovenox   GI Prophylaxis: None  Tubes/Drains: NOne     Activity: Rest  Isolation: None  Precautions: Aspiration  Monitoring: None    CHECKLIST:     DNR (No CPR, defibrillation, intubation),  Primary Emergency Contact: Jodi Bentley, Home Phone: [phone], Mobile Phone: [phone]    Diet: Diet pureed    VTE Prophylaxis: Prophylactic Lovenox     LDA: Urethral Catheter Standard 16 Fr. (2)  Peripheral IV 22 G Left;Posterior Hand (1)       Disposition:  Inpatient, Expected post-hospitalization disposition will be to Home.    OBJECTIVE         Objective (click to expand/collapse)     Scheduled medications   busPIRone   15 mg Oral TID    enoxaparin   40 mg Subcutaneous Q24H    ertapenem  IV  1 g Intravenous Q24H    pancrelipase  (lip-prot-amyl)  15,000 units of lipase Oral TID w/meals    pantoprazole   40 mg Oral QAM AC    QUEtiapine   12.5 mg Oral QHS    rivastigmine   4.5 mg Oral BID AC    venlafaxine   300 mg Oral Daily    vitamin D (cholecalciferol )  25 mcg Oral Daily       Continuous medications   sodium chloride  50 mL/hr (07/13/24 0358)       PRN medications:   acetaminophen     Vitals Current 24 Hour Min / Max      Temp    36.3 ?C (97.3 ?F)    Temp  Min: 36.3 ?C (97.3 ?F)  Max: 37.1 ?C (98.7 ?F)      BP     159/71     BP  Min: 123/67  Max: 165/67      HR    82    Pulse  Min: 75  Max: 89      RR    18    Resp  Min: 16  Max: 37      SpO2    99 %     SpO2  Min: 97 %  Max: 100 %      Weight    53.1 kg (117 lb 1 oz)     Admit: 59 kg (130 lb)       Intake/Output Summary (Last 24 hours) at 07/13/2024 1335  Last data filed at 07/13/2024 0500  Gross per 24 hour   Intake 745 ml   Output 533 ml   Net 212 ml         PHYSICAL EXAM:    System Normal Findings Abnormal Findings   General []  Normal general appearance   []  In acute distress  [x]  Chronically ill appearing  []  Sedated           []  Non Verbal  []  Other:   HENMT/Neck []  Oropharynx clear and moist  [x]  Normal JVP []  Dry mucous membranes  []  JVP ( cm)  []  Other:   Resp [x]  CTA B/L []  Intubated                 []  On Supp O2 ()  []  Wheezing ()  []  Rales ()   []  Crackles ()  []  Other:   CV [x]  Normal rhythm/rate  [x]  No murmurs  []  No lower extremity  edema  []  Normal PMI   Normal pulses:   []  Radial []  Femoral  []  Pedal []   Abnormal rhythm ()  []   Murmur ()  []  S3    []  S4  Abnormal Pulses:  []  Radial ()  []  Femoral ()   []  DP ()  []  PT ()  []  Lower extremity edema       []  R ()   []  L ()  []  Bilateral ()  []  Other:   GI [x]  Soft  []  No tenderness to palpation  []  No rebound/guarding []  Tenderness ()  []  Fluid Wave  []  Hepatomegaly  Other:   MSK []  Normal strength/tone []  Other:    Skin []  No rash []  Other:   Neuro []  AO x 3  []  Normal strength  []  Normal sensation  []  Normal reflexes  []  Normal cerebellar exam [x]  AO x 0       []  AO x 1     []  AO x 2  Abnormal neuro exam:  []  Abnormal strength  []  Abnormal sensation  []  Abnormal reflexes  []  Abnormal cerebellar exam       Labs    CBC  Recent Labs     07/12/24  0643 07/11/24  2014   WBC 12.56* 12.83*   HGB 10.9* 10.8*   HCT 34.5* 33.1*   MCV 93.2 91.9   PLT 302 295       Coags  No results for input(s): ''INR'', ''PT'', ''APTT'' in the last 72 hours.    Blood gas  No results for input(s): ''PHART'', ''PCO2ART'', ''PO2ART'', ''BICARBART'', ''BEART'' in the last 72 hours.    BMP  Recent Labs     07/12/24  0643 07/11/24  2014   NA 140 142   K 3.8 3.7   CL 109* 109*   CO2 29 29   BUN 22* 24*   CREAT 1.10 1.30   CALCIUM  8.9 8.6   MG 1.8  --    PHOS 3.1  --        LFT  Recent Labs     07/12/24  0643   TOTPRO 6.0*   ALBUMIN 2.9*   BILITOT 0.5   BILICON <0.1   ALT 19   AST 18   ALKPHOS 153*         Micro    Recent Results (from the past 4 weeks)   Bacterial Culture Blood    Collection Time: 07/11/24  9:58 PM    Specimen: Peripheral Vein; Blood   Result Value Ref Range    Blood Culture - Preliminary status Negative To Date Negative   Bacterial Culture Urine    Collection Time: 07/11/24 10:09 PM    Specimen: Clean Catch, Midstream; Urine   Result Value Ref Range    Bacterial Culture Urine >100,000 CFU/mL Proteus species (A)     Bacterial Culture Urine >100,000 CFU/mL Lactose Positive Gram Negative Rod (A)    Bacterial Culture Blood    Collection Time: 07/11/24 10:24 PM    Specimen: Peripheral Vein #2; Blood   Result Value Ref Range    Blood Culture - Preliminary status Negative To Date Negative         Radiology    No results found.       AUTHORBETHA Margretta RAMAN. Tobie, MD  07/13/2024 at 1:35 PM

## 2024-07-13 NOTE — Consults
 Wound Care Consult Note    SITUATION:   Wound Care consult received regarding possible pressure injury. Patient was evaluated at the bedside in 6E with incontinence associated dermatitis. Patient requires 2PA with repositioning.      BACKGROUND:   Medical history reviewed:  has a past medical history of Anxiety, Cancer (HCC/RAF), Dementia (HCC/RAF), Depression, and Histoplasmosis.   Wound history:  Treatments attempted and results: Critic-aid barrier cream    Social history:  Primary Living Situation: Facility                         ASSESSMENTS:   Preferred Language: English    Pt lethargic and forgetful    Pain: none  Activity: lying  Sleep Surface:  Standard Mattress     Braden Scale Score: (!) 7  Sensory Perceptions: Completely limited  Activity: Bedfast  Mobility: Very limited  Friction and Shear: Problem  Moisture: Constantly moist    Nutrition      Diets/Supplements/Feeds   Diet    Diet pureed     Start Date/Time: 07/12/24 1110      Number of Occurrences: Until Specified             Labs:   Recent Labs     07/12/24  0643   WBC 12.56*   RBC 3.70*   HGB 10.9*   HCT 34.5*   PLT 302   ALBUMIN 2.9*              Foley Cath Insertion date: 07/11/2025  Reason for Foley: Chronic retention- need outpatient management       Location: Sacrococcyx/ Bilateral Gluteus/ Perineum/Vaginal Area- Incontinence Associated Dermatitis   Measurement: n/a  Drainage: moist  Wound Bed: erythema, satellite lesion, denudation  Periwound: moist  Care/treatment: cleaned with [employer] Essential wipe, Triad cream, open to air      Location: Right Heel - Deep Tissue Pressure Injury (Present on Admission)  Measurement: diffused  Drainage: none noted  Wound Bed: erythema with purple hue, warm to touch  Periwound: clean and dry  Care/treatment: offloaded with pillows      Location: Left Heel -  Blanchable Erythema  Care/treatment: offloaded with pillows      Location: Right Ear -  Blanchable Erythema  Care/treatment: provided padding with a Mepilex Lite      Location: Left Ear -  Blanchable Erythema  Care/treatment: provided padding with a Mepilex Lite    Patient Education: Patient was not able to be instructed regarding findings and recommendations.    Family Education: No family at bedside    Nurse report: Primary RN Brendan was informed of findings and recommendations.       RECOMMENDATIONS:   Goals: Promote moist wound healing   Prevent deterioration/skin breakdown   Provide optimal moisture/ incontinence management   Provide pressure relief/ redistribution    Treatment:  Location: Sacrococcyx/ Bilateral Gluteus/ Perineum/Vaginal Area- Incontinence Associated Dermatitis  1. Clean wound with NS or Vashe wound cleanser and gently pat dry.   2. Apply Nystatin  powder to areas with satellite lesions, then brush off access.  3. Apply a dime-thick layer of Triad Hydrophilic wound dressing directly to the wound bed.    3. Perform BID and reapply as needed after each episode of incontinence.    Location: Right Heel - Deep Tissue Pressure Injury (Present on Admission)  Location: Left Heel -  Blanchable Erythema  Bilateral heel floatation - apply 1-2 pillows underneath calves in order to  prevent heels having contact with bed surface     Location: Right Ear -  Blanchable Erythema  Location: Left Ear -  Blanchable Erythema  Cover with a Mepilex Lite dressing and monitor for further skin breakdown, drainage, or deterioration.     Protect bony prominences, at risk areas for skin breakdown, and skin beneath medical devices with Mepilex.        Continue pressure ulcer prevention interventions per Pressure Ulcer Prevention Guidelines    Attending MD, Dr. Tobie has been notified regarding the wound care recommendation via secure chat on 07/13/2024 @1242 .  Will sign off at this time, please consult again if needed    The bedside RN will continue to follow treatment recommendations. The wound care team will follow as clinically indicated while the patient is in the hospital and the bedside nurse is to contact the wound team if wound(s) deteriorate.     Becky Argyle, RN, Charity fundraiser, BSN, [employer], Ostomy, and [employer]  07/13/2024

## 2024-07-14 LAB — Bacterial Culture Urine
BACTERIAL CULTURE URINE: >100000 — AB
BACTERIAL CULTURE URINE: >100000 — AB

## 2024-07-14 LAB — CBC: MEAN PLATELET VOLUME: 11 fL (ref 9.3–13.0)

## 2024-07-14 MED ADMIN — RIVASTIGMINE TARTRATE 1.5 MG PO CAPS: 4.5 mg | ORAL | Stop: 2024-07-18 | NDC 60687077711

## 2024-07-14 MED ADMIN — PANCRELIPASE (LIP-PROT-AMYL) 5000-24000 UNITS PO CPEP: 15000 [IU] | ORAL | @ 15:00:00 | Stop: 2024-07-18 | NDC 73562011501

## 2024-07-14 MED ADMIN — CHOLECALCIFEROL 25 MCG (1000 UT) PO TABS: 25 ug | ORAL | @ 15:00:00 | Stop: 2024-07-18

## 2024-07-14 MED ADMIN — PANTOPRAZOLE SODIUM 40 MG PO TBEC: 40 mg | ORAL | @ 12:00:00 | Stop: 2024-07-18 | NDC 60687073609

## 2024-07-14 MED ADMIN — VENLAFAXINE HCL ER 75 MG PO CP24: 300 mg | ORAL | @ 15:00:00 | Stop: 2024-07-18 | NDC 68084070911

## 2024-07-14 MED ADMIN — RIVASTIGMINE TARTRATE 1.5 MG PO CAPS: 4.5 mg | ORAL | @ 23:00:00 | Stop: 2024-07-18 | NDC 60687077711

## 2024-07-14 MED ADMIN — BUSPIRONE HCL 5 MG PO TABS: 15 mg | ORAL | @ 04:00:00 | Stop: 2024-07-18 | NDC 29300024401

## 2024-07-14 MED ADMIN — NYSTATIN 100000 UNIT/GM EX POWD: TOPICAL | @ 15:00:00 | Stop: 2024-07-18 | NDC 00574200815

## 2024-07-14 MED ADMIN — BUSPIRONE HCL 5 MG PO TABS: 15 mg | ORAL | @ 20:00:00 | Stop: 2024-07-18 | NDC 29300024401

## 2024-07-14 MED ADMIN — BUSPIRONE HCL 5 MG PO TABS: 15 mg | ORAL | @ 12:00:00 | Stop: 2024-07-18 | NDC 29300024401

## 2024-07-14 MED ADMIN — PANCRELIPASE (LIP-PROT-AMYL) 5000-24000 UNITS PO CPEP: 15000 [IU] | ORAL | Stop: 2024-07-18 | NDC 73562011501

## 2024-07-14 MED ADMIN — NYSTATIN 100000 UNIT/GM EX POWD: TOPICAL | @ 04:00:00 | Stop: 2024-07-18 | NDC 00574200815

## 2024-07-14 MED ADMIN — QUETIAPINE FUMARATE 25 MG PO TABS: 12.5 mg | ORAL | @ 04:00:00 | Stop: 2024-07-18 | NDC 60687032711

## 2024-07-14 MED ADMIN — RIVASTIGMINE TARTRATE 1.5 MG PO CAPS: 4.5 mg | ORAL | @ 12:00:00 | Stop: 2024-07-18 | NDC 60687077711

## 2024-07-14 MED ADMIN — ERTAPENEM 1 G IN SWFI IVP: 1 g | INTRAVENOUS | @ 09:00:00 | Stop: 2024-07-15 | NDC 60505619600

## 2024-07-14 MED ADMIN — PANCRELIPASE (LIP-PROT-AMYL) 5000-24000 UNITS PO CPEP: 15000 [IU] | ORAL | @ 20:00:00 | Stop: 2024-07-18 | NDC 73562011501

## 2024-07-14 MED ADMIN — ENOXAPARIN SODIUM 40 MG/0.4ML IJ SOSY: 40 mg | SUBCUTANEOUS | @ 09:00:00 | Stop: 2024-07-15 | NDC 71288043382

## 2024-07-14 NOTE — Consults
 NUTRITION ASSESSMENT (Adult)    Admit Date: 07/12/2024     Date of Birth: 23-Dec-1940 Gender: female MRN: [id number]     Date of Assessment: 07/14/2024   Indication:  (UTI- puree diet)   Subjective: Pt well nourished, confused, family at bedside, stated no food allergy, no wt loss, agreed to have oral supplements     Past Medical History:   Diagnosis Date    Anxiety     Cancer (HCC/RAF)     Dementia (HCC/RAF)     Depression     Histoplasmosis     Past Surgical History:   Procedure Laterality Date    ABDOMINAL SURGERY      BREAST SURGERY            Labs and Medications reviewed     Diet Info   Allergies:   Ace inhibitors, Sulfasalazine, and Sulfa antibiotics  Cultural/Ethnic/Religious/Other Food Preferences:  None     Nutrition Regimen PTA:  regular diet  Current diet order:     Diets/Supplements/Feeds   Diet    Diet pureed     Start Date/Time: 07/12/24 1110      Number of Occurrences: Until Specified     PO % consumed: 51 to 75% (variable po intake of 26-75%)  Nutrition Support: none  Other caloric sources: none     Anthropometrics:   Height:    Admit Weight: 59 kg (130 lb) (07/11/24 1929)  Current Weight:    BMI:    IBW:  /   IBW %:    Adj BW (if applicable):    UBW:    UBW %:         Weight History   Wt Readings from Last 10 Encounters:   07/12/24 53.1 kg (117 lb 1 oz)   05/11/24 59 kg (130 lb)   04/23/24 59.3 kg (130 lb 11.7 oz)   04/20/24 56.7 kg (125 lb)   04/03/24 57.1 kg (125 lb 12.8 oz)   03/23/24 57.6 kg (127 lb)   03/02/24 59 kg (130 lb)   02/24/24 57.6 kg (127 lb)   02/19/24 58.5 kg (129 lb)   12/30/23 59 kg (130 lb)        Estimated Nutrition Needs   Based on wt: 63 kg (IBW= 63 kg)  Calories: 25 - 30 cal/kg = 1575 - 1890 cal/day  Protein: 1 - 1.4 g pro/kg = 63 - 88.2 g pro/day     Diet Education              Malnutrition Assessment   Malnutrition in the Context of: Acute   Energy Intake: Does not meet criterion   Weight Loss: Does not meet criterion    Nutrition-Focused Physical Exam: 07/14/2024  No Indication for NFPE      Patient meets criteria for:      Nutrition Assessment   Anthropometrics: low BMI- per family pt is always like this and no recent wt loss    Nutrition and Tolerance: on diet with fair/good po intake    GI:   Patient is not on bowel regimen. Last BM 9/22    Labs: reviewed; WBC high but improved,     Micronutrients: vit B12 and Vit D levels from 2024 noted    Skin: no pressure injuries see photos and W/RN note on 9/22= incontinent associated dermatitis, blanchable erythema     Recommendations / Care Plan      Continue with puree diet  Boost VHC chocolate BID  Encourage po  intake    Author: Armando Isles, MS, RD      07/14/2024 1:25 PM       Securechat and Pended orders for Attending and RN  RD to monitor and follow-up per nutrition policy

## 2024-07-14 NOTE — Progress Notes
 [facility]  HOSPITALIST PROGRESS NOTE      For questions from 7am to 5pm, please message me on Epic Chat.     After 5 PM PST, PAGE [phone] for [facility] patients        Patient Name: Jodi Bentley     DOB: Sep 10, 1941  MRN: [id number]  PCP: Lajean Ulysses Chiquita CHRISTELLA., MD Admission: 07/12/2024    Date of Service: 07/14/2024  Hospital Day: Hospital Day: 4       Principle Problem: <principal problem not specified>    Chief Complaint: Generalized Weakness (BIBA FROM [name], CAREGIVER NOTICED THAT SHE WAS WEAKER THAN NORMAL AROUND 4PM. PT IS NORMALLY ABLE TO FEED SELF AND WAS UNABLE TO THIS AFTERNOON. SHE IS DNR/DNI. HX DEMENTIA)    SUBJECTIVE/INTERVAL EVENTS     ID: Jodi Bentley is a 83 y.o. female with PMH significant for Dementia presenting with concern for significant decline in pt's function and concern for UTI.     9/23  - proteus is resistant only to macrobid   - Kelbsiella also in culture, resistance pending  - continue ertapenem  for now  - today, awakes, oriented to self    Subjective (click to expand/collapse)      9/22  -Aox2  -UCx prelim 100K Proteus/ Lactose pos GNR  -Cont Ertapenem      9/21  - vitals unremarkable, afebrile, on 2L NC though satting 98-100  - bld cx NGTD  - urine cx in process  - WBC 12.8 -> 12.5  - alk phos mildly high at 153, albumin low at 2.9  - UA is positive with > 1000 WBC  - foley catheter exchanged in the ER  - previously on hospice care, POLST reviewed, DNR  - continue ertapenem     Per admit H&P:  ''  Jodi Bentley is a 83 y.o. female with PMH significant for Dementia presenting with concern for significant decline in pt's function and concern for UTI.     I was unable to reach family for collateral at the time of my assessment, and thus relied on history already taken. Pt not able to participate in any significantly meaningful way.     Per ED note, the patient was weaker than normal the day of admission compared to the previous day. Pt has also experienced significant deterioration over the past 8 to 12 weeks, with an accelerated decline in her verbal communication, now being mostly nonverbal and only able to mumble. Pt has a history of recurrent UTIs and the odor of her urine made family suspect a UTI. Thus patient was brought in.     Pt's code status was confirmed DNR/DNI with my conversations with ED staff.     ED Course:  Basic labs acquired  UA + Ucx  Ertapenem  1g x1  Lovenox  40mg    Admitted ''          ASSESSMENT/PLAN       # UTI  # Hx ESBL E Coli  Pt with UA strongly suggestive of UTI, with leukocytosis but no other SIRS criteria. Previous Ucx in 04/2024 grew ESBL E Coli. Bcx no growth. Ucx prelim Proteus and GNR 100k. SLP recs for Puree and thin liquids.  - s/p Ertapenem  1g (9/21); c/w Ertapenem  1g (9/21-) x7d for ESBL UTI  - IVF iso concern for possible aspiration  - aspiration precautions     Plan:  Treat UTI    Chronic, Stable Problems:  # Dementia  Unclear home regimen  -  Melatonin at bedtime and Zyprexa  PRN for agitation  -Cont PTA Quetiapine  12.5mg  nightly  -Cont PTA Rivastigmine  4.5mg  2 times daily before meals  -Cont PTA Venlafaxine  300mg  daily  -Cont PTA Buspirone  15mg  TID     #Incontinence Associated Dermatitis and pressures heel erythema  Wound care recs appreciated for sacrococcyx, gluteal, perineal, and vaginal dermatitis     Puree and Thin Liquids  VTE Prophylaxis: Lovenox   GI Prophylaxis: None  Tubes/Drains: NOne     Activity: Rest  Isolation: None  Precautions: Aspiration  Monitoring: None    CHECKLIST:     DNR (No CPR, defibrillation, intubation),  Primary Emergency Contact: [name],[name], Home Phone: [phone], Mobile Phone: [phone]    Diet: Diet pureed  Oral nutrition supplements Breakfast, Dinner; Boost VHC Chocolate    VTE Prophylaxis: Prophylactic Lovenox     LDA: Urethral Catheter Standard 16 Fr. (3)  Peripheral IV 22 G Left;Posterior Hand (2)       Disposition:  Inpatient, Expected post-hospitalization disposition will be to Home.    OBJECTIVE         Objective (click to expand/collapse)     Scheduled medications   busPIRone   15 mg Oral TID    enoxaparin   40 mg Subcutaneous Q24H    ertapenem  IV  1 g Intravenous Q24H    nystatin    Topical BID    pancrelipase  (lip-prot-amyl)  15,000 units of lipase Oral TID w/meals    pantoprazole   40 mg Oral QAM AC    QUEtiapine   12.5 mg Oral QHS    rivastigmine   4.5 mg Oral BID AC    venlafaxine   300 mg Oral Daily    vitamin D (cholecalciferol )  25 mcg Oral Daily       Continuous medications        PRN medications:   acetaminophen     Vitals Current 24 Hour Min / Max      Temp    36.8 ?C (98.2 ?F)    Temp  Min: 36.4 ?C (97.5 ?F)  Max: 36.8 ?C (98.2 ?F)      BP     168/77     BP  Min: 126/66  Max: 168/77      HR    80    Pulse  Min: 66  Max: 85      RR    17    Resp  Min: 17  Max: 18      SpO2    93 %     SpO2  Min: 93 %  Max: 98 %      Weight    53.1 kg (117 lb 1 oz)     Admit: 59 kg (130 lb)       Intake/Output Summary (Last 24 hours) at 07/14/2024 1433  Last data filed at 07/14/2024 0500  Gross per 24 hour   Intake --   Output 1150 ml   Net -1150 ml         PHYSICAL EXAM:    System Normal Findings Abnormal Findings   General []  Normal general appearance   []  In acute distress  [x]  Chronically ill appearing  []  Sedated           []  Non Verbal  []  Other:   HENMT/Neck []  Oropharynx clear and moist  [x]  Normal JVP []  Dry mucous membranes  []  JVP ( cm)  []  Other:   Resp [x]  CTA B/L []  Intubated                 []   On Supp O2 ()  []  Wheezing ()  []  Rales ()   []  Crackles ()  []  Other:   CV [x]  Normal rhythm/rate  [x]  No murmurs  []  No lower extremity edema  []  Normal PMI   Normal pulses:   []  Radial []  Femoral  []  Pedal []   Abnormal rhythm ()  []   Murmur ()  []  S3    []  S4  Abnormal Pulses:  []  Radial ()  []  Femoral ()   []  DP ()  []  PT ()  []  Lower extremity edema       []  R ()   []  L ()  []  Bilateral ()  []  Other:   GI [x]  Soft  []  No tenderness to palpation  []  No rebound/guarding []  Tenderness ()  []  Fluid Wave  []  Hepatomegaly  Other:   MSK []  Normal strength/tone []  Other:    Skin []  No rash []  Other:   Neuro []  AO x 3  []  Normal strength  []  Normal sensation  []  Normal reflexes  []  Normal cerebellar exam []  AO x 0       [x]  AO x 1     []  AO x 2  Abnormal neuro exam:  []  Abnormal strength  []  Abnormal sensation  []  Abnormal reflexes  []  Abnormal cerebellar exam       Labs    CBC  Recent Labs     07/14/24  0450 07/12/24  0643 07/11/24  2014   WBC 11.11* 12.56* 12.83*   HGB 10.1* 10.9* 10.8*   HCT 31.9* 34.5* 33.1*   MCV 93.8 93.2 91.9   PLT 256 302 295       Coags  No results for input(s): ''INR'', ''PT'', ''APTT'' in the last 72 hours.    Blood gas  No results for input(s): ''PHART'', ''PCO2ART'', ''PO2ART'', ''BICARBART'', ''BEART'' in the last 72 hours.    BMP  Recent Labs     07/12/24  0643 07/11/24  2014   NA 140 142   K 3.8 3.7   CL 109* 109*   CO2 29 29   BUN 22* 24*   CREAT 1.10 1.30   CALCIUM  8.9 8.6   MG 1.8  --    PHOS 3.1  --        LFT  Recent Labs     07/12/24  0643   TOTPRO 6.0*   ALBUMIN 2.9*   BILITOT 0.5   BILICON <0.1   ALT 19   AST 18   ALKPHOS 153*         Micro    Recent Results (from the past 4 weeks)   Bacterial Culture Blood    Collection Time: 07/11/24  9:58 PM    Specimen: Peripheral Vein; Blood   Result Value Ref Range    Blood Culture - Preliminary status Negative To Date Negative   Bacterial Culture Urine    Collection Time: 07/11/24 10:09 PM    Specimen: Clean Catch, Midstream; Urine   Result Value Ref Range    Bacterial Culture Urine >100,000 CFU/mL Proteus mirabilis (A)     Bacterial Culture Urine >100,000 CFU/mL Klebsiella pneumoniae (A)        Susceptibility    Proteus mirabilis - MIC (MCG/ML)*     Amoxicillin + Clavulanate <=2 Susceptible      Ampicillin <=2 Susceptible      Oral Cephalosporins S Susceptible      Ceftriaxone  <=0.25 Susceptible      Ciprofloxacin  <=  0.06 Susceptible      Levofloxacin <=0.12 Susceptible      Nitrofurantoin  128 Resistant      Tobramycin <=1 Susceptible Trimethoprim/Sulfamethoxazole <=20 Susceptible      * Nitrofurantoin  should not be used in patients with CrCl < 30 mL/min or in patients with suspected or confirmed pyelonephritis.    Oral Cephalosporins include Cefpodoxime and Cephalexin for treatment of uncomplicated urinary tract infections.     Bacterial Culture Blood    Collection Time: 07/11/24 10:24 PM    Specimen: Peripheral Vein #2; Blood   Result Value Ref Range    Blood Culture - Preliminary status Negative To Date Negative         Radiology    No results found.       AUTHOR:  Camellia BIRCH. Rojelio, MD  07/14/2024 at 2:33 PM

## 2024-07-15 LAB — Bacterial Culture Urine: BACTERIAL CULTURE URINE: >100000 — AB

## 2024-07-15 LAB — Differential Automated: NEUTROPHIL PERCENT, AUTO: 77.4 % (ref 1.80–6.90)

## 2024-07-15 LAB — Magnesium: MAGNESIUM: 1.7 meq/L (ref 1.5–2.0)

## 2024-07-15 LAB — Comprehensive Metabolic Panel: ESTIMATED GFR 2021 CKD-EPI: 45 mL/min/1.73m2 (ref 3.4–5.0)

## 2024-07-15 LAB — Phosphorus: PHOSPHORUS: 3.4 mg/dL (ref 2.5–4.9)

## 2024-07-15 LAB — CBC: MEAN PLATELET VOLUME: 10.6 fL (ref 9.3–13.0)

## 2024-07-15 MED ADMIN — BUSPIRONE HCL 5 MG PO TABS: 15 mg | ORAL | @ 21:00:00 | Stop: 2024-07-18

## 2024-07-15 MED ADMIN — LACTATED RINGERS IV SOLN: 75 mL/h | INTRAVENOUS | @ 23:00:00 | Stop: 2024-07-18 | NDC 00338011704

## 2024-07-15 MED ADMIN — PANTOPRAZOLE SODIUM 40 MG PO TBEC: 40 mg | ORAL | @ 14:00:00 | Stop: 2024-07-18

## 2024-07-15 MED ADMIN — VENLAFAXINE HCL ER 75 MG PO CP24: 300 mg | ORAL | @ 16:00:00 | Stop: 2024-07-18 | NDC 68084070911

## 2024-07-15 MED ADMIN — PANCRELIPASE (LIP-PROT-AMYL) 5000-24000 UNITS PO CPEP: 15000 [IU] | ORAL | @ 16:00:00 | Stop: 2024-07-18 | NDC 73562011501

## 2024-07-15 MED ADMIN — ERTAPENEM 1 G IN SWFI IVP: 1 g | INTRAVENOUS | @ 09:00:00 | Stop: 2024-07-15 | NDC 60505619600

## 2024-07-15 MED ADMIN — RIVASTIGMINE TARTRATE 1.5 MG PO CAPS: 4.5 mg | ORAL | @ 14:00:00 | Stop: 2024-07-18

## 2024-07-15 MED ADMIN — BUSPIRONE HCL 5 MG PO TABS: 15 mg | ORAL | @ 03:00:00 | Stop: 2024-07-18 | NDC 29300024401

## 2024-07-15 MED ADMIN — BUSPIRONE HCL 5 MG PO TABS: 15 mg | ORAL | @ 14:00:00 | Stop: 2024-07-18

## 2024-07-15 MED ADMIN — PANCRELIPASE (LIP-PROT-AMYL) 5000-24000 UNITS PO CPEP: 15000 [IU] | ORAL | @ 01:00:00 | Stop: 2024-07-18 | NDC 73562011501

## 2024-07-15 MED ADMIN — PANCRELIPASE (LIP-PROT-AMYL) 5000-24000 UNITS PO CPEP: 15000 [IU] | ORAL | @ 21:00:00 | Stop: 2024-07-18

## 2024-07-15 MED ADMIN — CHOLECALCIFEROL 25 MCG (1000 UT) PO TABS: 25 ug | ORAL | @ 16:00:00 | Stop: 2024-07-18

## 2024-07-15 MED ADMIN — ENOXAPARIN SODIUM 40 MG/0.4ML IJ SOSY: 40 mg | SUBCUTANEOUS | @ 11:00:00 | Stop: 2024-07-15 | NDC 71288043382

## 2024-07-15 MED ADMIN — RIVASTIGMINE TARTRATE 1.5 MG PO CAPS: 4.5 mg | ORAL | @ 23:00:00 | Stop: 2024-07-18

## 2024-07-15 MED ADMIN — NYSTATIN 100000 UNIT/GM EX POWD: TOPICAL | @ 03:00:00 | Stop: 2024-07-18 | NDC 00574200815

## 2024-07-15 MED ADMIN — NYSTATIN 100000 UNIT/GM EX POWD: TOPICAL | @ 16:00:00 | Stop: 2024-07-18 | NDC 00574200815

## 2024-07-15 MED ADMIN — QUETIAPINE FUMARATE 25 MG PO TABS: 12.5 mg | ORAL | @ 03:00:00 | Stop: 2024-07-18 | NDC 60687032711

## 2024-07-15 NOTE — Progress Notes
 [facility]  HOSPITALIST PROGRESS NOTE      For questions from 7am to 5pm, please message me on Epic Chat.     After 5 PM PST, PAGE [phone] for [facility] patients        Patient Name: Jodi Bentley     DOB: 11/21/1940  MRN: [id number]  PCP: Lajean Ulysses Chiquita CHRISTELLA., MD Admission: 07/12/2024    Date of Service: 07/15/2024  Hospital Day: Hospital Day: 5       Principle Problem: <principal problem not specified>    Chief Complaint: Generalized Weakness (BIBA FROM [name], CAREGIVER NOTICED THAT SHE WAS WEAKER THAN NORMAL AROUND 4PM. PT IS NORMALLY ABLE TO FEED SELF AND WAS UNABLE TO THIS AFTERNOON. SHE IS DNR/DNI. HX DEMENTIA)    SUBJECTIVE/INTERVAL EVENTS     ID: Jodi Bentley is a 83 y.o. female with PMH significant for Dementia presenting with concern for significant decline in pt's function and concern for UTI.     9/24  - change to CTX  - patient a little more somnolent today  - spoke with family at bedside  - will need HH vs palliative care on discharge, wishes to stop hospice  - she wants to continue treating UTI's although does not want more aggressive treatments  - given no imminent life threatening illness, that seems reasonable but may preclude hospice given wants to continue abx for UTIs    Subjective (click to expand/collapse)      9/23  - proteus is resistant only to macrobid   - Alyssa also in culture, resistance pending  - continue ertapenem  for now  - today, awakes, oriented to self    9/22  -Aox2  -UCx prelim 100K Proteus/ Lactose pos GNR  -Cont Ertapenem      9/21  - vitals unremarkable, afebrile, on 2L NC though satting 98-100  - bld cx NGTD  - urine cx in process  - WBC 12.8 -> 12.5  - alk phos mildly high at 153, albumin low at 2.9  - UA is positive with > 1000 WBC  - foley catheter exchanged in the ER  - previously on hospice care, POLST reviewed, DNR  - continue ertapenem     Per admit H&P:  ''  Jodi Bentley is a 83 y.o. female with PMH significant for Dementia presenting with concern for significant decline in pt's function and concern for UTI.     I was unable to reach family for collateral at the time of my assessment, and thus relied on history already taken. Pt not able to participate in any significantly meaningful way.     Per ED note, the patient was weaker than normal the day of admission compared to the previous day. Pt has also experienced significant deterioration over the past 8 to 12 weeks, with an accelerated decline in her verbal communication, now being mostly nonverbal and only able to mumble. Pt has a history of recurrent UTIs and the odor of her urine made family suspect a UTI. Thus patient was brought in.     Pt's code status was confirmed DNR/DNI with my conversations with ED staff.     ED Course:  Basic labs acquired  UA + Ucx  Ertapenem  1g x1  Lovenox  40mg    Admitted ''          ASSESSMENT/PLAN       # Catheter Associated UTI, POA - chronic catheter, exchanged in ER  # Hx ESBL E Coli  # Metabolic  encephalopathy due to UTI superimposed on Dementia   Pt with UA strongly suggestive of UTI, with leukocytosis but no other SIRS criteria. Previous Ucx in 04/2024 grew ESBL E Coli. Bcx no growth. Ucx prelim Proteus and GNR 100k. SLP recs for Puree and thin liquids.  - s/p Ertapenem  1g (9/21); c/w Ertapenem  1g (9/21-) x7d for ESBL UTI  - IVF iso concern for possible aspiration  - aspiration precautions     Plan:  Treat UTI    Chronic, Stable Problems:  # Dementia  Unclear home regimen  - Melatonin at bedtime and Zyprexa  PRN for agitation  -Cont PTA Quetiapine  12.5mg  nightly  -Cont PTA Rivastigmine  4.5mg  2 times daily before meals  -Cont PTA Venlafaxine  300mg  daily  -Cont PTA Buspirone  15mg  TID     #Incontinence Associated Dermatitis and pressures heel erythema  Wound care recs appreciated for sacrococcyx, gluteal, perineal, and vaginal dermatitis     Puree and Thin Liquids  VTE Prophylaxis: Lovenox   GI Prophylaxis: None  Tubes/Drains: NOne     Activity: Rest  Isolation: None  Precautions: Aspiration  Monitoring: None    CHECKLIST:     DNR (No CPR, defibrillation, intubation),  Primary Emergency Contact: [name],[name], Home Phone: [phone], Mobile Phone: [phone]    Diet: Diet pureed  Oral nutrition supplements Breakfast, Dinner; Boost VHC Chocolate    VTE Prophylaxis: Prophylactic Lovenox     LDA: Urethral Catheter Standard 16 Fr. (4)  Peripheral IV 22 G Left;Posterior Hand (3)       Disposition:  Inpatient, Expected post-hospitalization disposition will be to Home.    OBJECTIVE         Objective (click to expand/collapse)     Scheduled medications   busPIRone   15 mg Oral TID    cefTRIAXone   1 g IV Push Q24H    [START ON 07/16/2024] enoxaparin   30 mg Subcutaneous Q24H    nystatin    Topical BID    pancrelipase  (lip-prot-amyl)  15,000 units of lipase Oral TID w/meals    pantoprazole   40 mg Oral QAM AC    QUEtiapine   12.5 mg Oral QHS    rivastigmine   4.5 mg Oral BID AC    venlafaxine   300 mg Oral Daily    vitamin D (cholecalciferol )  25 mcg Oral Daily       Continuous medications   lactated ringers            PRN medications:   acetaminophen , haloperidol, hyoscyamine    Vitals Current 24 Hour Min / Max      Temp    36.8 ?C (98.2 ?F)    Temp  Min: 36.5 ?C (97.7 ?F)  Max: 36.8 ?C (98.2 ?F)      BP     150/81     BP  Min: 125/75  Max: 165/123      HR    89    Pulse  Min: 83  Max: 101      RR    18    Resp  Min: 17  Max: 18      SpO2    96 %     SpO2  Min: 93 %  Max: 99 %      Weight    53.1 kg (117 lb 1 oz)     Admit: 59 kg (130 lb)       Intake/Output Summary (Last 24 hours) at 07/15/2024 1502  Last data filed at 07/15/2024 0603  Gross per 24 hour   Intake 200 ml  Output 850 ml   Net -650 ml         PHYSICAL EXAM:    System Normal Findings Abnormal Findings   General []  Normal general appearance   []  In acute distress  [x]  Chronically ill appearing  []  Sedated           []  Non Verbal  []  Other:   HENMT/Neck []  Oropharynx clear and moist  [x]  Normal JVP []  Dry mucous membranes  []  JVP ( cm)  []  Other:   Resp [x]  CTA B/L []  Intubated                 []  On Supp O2 ()  []  Wheezing ()  []  Rales ()   []  Crackles ()  []  Other:   CV [x]  Normal rhythm/rate  [x]  No murmurs  []  No lower extremity edema  []  Normal PMI   Normal pulses:   []  Radial []  Femoral  []  Pedal []   Abnormal rhythm ()  []   Murmur ()  []  S3    []  S4  Abnormal Pulses:  []  Radial ()  []  Femoral ()   []  DP ()  []  PT ()  []  Lower extremity edema       []  R ()   []  L ()  []  Bilateral ()  []  Other:   GI [x]  Soft  []  No tenderness to palpation  []  No rebound/guarding []  Tenderness ()  []  Fluid Wave  []  Hepatomegaly  Other:   MSK []  Normal strength/tone []  Other:    Skin []  No rash []  Other:   Neuro []  AO x 3  []  Normal strength  []  Normal sensation  []  Normal reflexes  []  Normal cerebellar exam [x]  AO x 0       []  AO x 1     []  AO x 2  Abnormal neuro exam:  []  Abnormal strength  []  Abnormal sensation  []  Abnormal reflexes  []  Abnormal cerebellar exam       Labs    CBC  Recent Labs     07/15/24  0950 07/14/24  0450   WBC 16.81* 11.11*   HGB 11.9 10.1*   HCT 36.8 31.9*   MCV 93.6 93.8   PLT 308 256       Coags  No results for input(s): ''INR'', ''PT'', ''APTT'' in the last 72 hours.    Blood gas  No results for input(s): ''PHART'', ''PCO2ART'', ''PO2ART'', ''BICARBART'', ''BEART'' in the last 72 hours.    BMP  Recent Labs     07/15/24  0950   NA 141   K 3.6   CL 109*   CO2 31   BUN 16   CREAT 1.20   CALCIUM  8.7   MG 1.7   PHOS 3.4       LFT  Recent Labs     07/15/24  0950   TOTPRO 6.1*   ALBUMIN 2.9*   BILITOT 0.5   ALT 16   AST 15   ALKPHOS 153*         Micro    Recent Results (from the past 4 weeks)   Bacterial Culture Blood    Collection Time: 07/11/24  9:58 PM    Specimen: Peripheral Vein; Blood   Result Value Ref Range    Blood Culture - Preliminary status Negative To Date Negative   Bacterial Culture Urine    Collection Time: 07/11/24 10:09 PM    Specimen: Clean Catch, Midstream; Urine  Result Value Ref Range    Bacterial Culture Urine >100,000 CFU/mL Proteus mirabilis (A)     Bacterial Culture Urine >100,000 CFU/mL Klebsiella pneumoniae (A)        Susceptibility    Klebsiella pneumoniae - MIC (MCG/ML)*     Amoxicillin + Clavulanate <=2 Susceptible      Ampicillin R Resistant      Oral Cephalosporins S Susceptible      Ceftriaxone  <=0.25 Susceptible      Ciprofloxacin  <=0.06 Susceptible      Levofloxacin <=0.12 Susceptible      Nitrofurantoin  64 Intermediate      Tobramycin <=1 Susceptible      Trimethoprim/Sulfamethoxazole <=20 Susceptible      * Nitrofurantoin  should not be used in patients with CrCl < 30 mL/min or in patients with suspected or confirmed pyelonephritis.    Oral Cephalosporins include Cefpodoxime and Cephalexin for treatment of uncomplicated urinary tract infections.      Proteus mirabilis - MIC (MCG/ML)*     Amoxicillin + Clavulanate <=2 Susceptible      Ampicillin <=2 Susceptible      Oral Cephalosporins S Susceptible      Ceftriaxone  <=0.25 Susceptible      Ciprofloxacin  <=0.06 Susceptible      Levofloxacin <=0.12 Susceptible      Nitrofurantoin  128 Resistant      Tobramycin <=1 Susceptible      Trimethoprim/Sulfamethoxazole <=20 Susceptible      * Nitrofurantoin  should not be used in patients with CrCl < 30 mL/min or in patients with suspected or confirmed pyelonephritis.    Oral Cephalosporins include Cefpodoxime and Cephalexin for treatment of uncomplicated urinary tract infections.     Bacterial Culture Blood    Collection Time: 07/11/24 10:24 PM    Specimen: Peripheral Vein #2; Blood   Result Value Ref Range    Blood Culture - Preliminary status Negative To Date Negative         Radiology    No results found.       AUTHOR:  Camellia BIRCH. Rojelio, MD  07/15/2024 at 3:02 PM

## 2024-07-16 LAB — Phosphorus: PHOSPHORUS: 3.1 mg/dL (ref 2.5–4.9)

## 2024-07-16 LAB — CBC: RED CELL DISTRIBUTION WIDTH-SD: 52.1 fL — ABNORMAL HIGH (ref 36.9–48.3)

## 2024-07-16 LAB — Comprehensive Metabolic Panel: CALCIUM: 8.1 mg/dL — ABNORMAL LOW (ref 8.5–10.1)

## 2024-07-16 LAB — Differential Automated: ABSOLUTE IMMATURE GRAN COUNT: 0.04 x10E3/uL (ref 0.00–0.04)

## 2024-07-16 LAB — Magnesium: MAGNESIUM: 1.7 meq/L (ref 1.5–2.0)

## 2024-07-16 MED ADMIN — NYSTATIN 100000 UNIT/GM EX POWD: TOPICAL | @ 05:00:00 | Stop: 2024-07-18 | NDC 00574200815

## 2024-07-16 MED ADMIN — RIVASTIGMINE TARTRATE 1.5 MG PO CAPS: 4.5 mg | ORAL | Stop: 2024-07-18 | NDC 60687077711

## 2024-07-16 MED ADMIN — PANCRELIPASE (LIP-PROT-AMYL) 5000-24000 UNITS PO CPEP: 15000 [IU] | ORAL | @ 19:00:00 | Stop: 2024-07-18 | NDC 73562011501

## 2024-07-16 MED ADMIN — BUSPIRONE HCL 5 MG PO TABS: 15 mg | ORAL | @ 05:00:00 | Stop: 2024-07-18 | NDC 29300024401

## 2024-07-16 MED ADMIN — BUSPIRONE HCL 5 MG PO TABS: 15 mg | ORAL | @ 19:00:00 | Stop: 2024-07-18 | NDC 29300024401

## 2024-07-16 MED ADMIN — BUSPIRONE HCL 5 MG PO TABS: 15 mg | ORAL | @ 13:00:00 | Stop: 2024-07-18 | NDC 29300024401

## 2024-07-16 MED ADMIN — NYSTATIN 100000 UNIT/GM EX POWD: TOPICAL | @ 16:00:00 | Stop: 2024-07-18 | NDC 00574200815

## 2024-07-16 MED ADMIN — CHOLECALCIFEROL 25 MCG (1000 UT) PO TABS: 25 ug | ORAL | @ 16:00:00 | Stop: 2024-07-18

## 2024-07-16 MED ADMIN — QUETIAPINE FUMARATE 25 MG PO TABS: 12.5 mg | ORAL | @ 05:00:00 | Stop: 2024-07-18 | NDC 60687032711

## 2024-07-16 MED ADMIN — ENOXAPARIN SODIUM 30 MG/0.3ML IJ SOSY: 30 mg | SUBCUTANEOUS | @ 13:00:00 | Stop: 2024-07-16 | NDC 71288043280

## 2024-07-16 MED ADMIN — RIVASTIGMINE TARTRATE 1.5 MG PO CAPS: 4.5 mg | ORAL | @ 13:00:00 | Stop: 2024-07-18 | NDC 60687077711

## 2024-07-16 MED ADMIN — VENLAFAXINE HCL ER 75 MG PO CP24: 300 mg | ORAL | @ 16:00:00 | Stop: 2024-07-18 | NDC 68084070911

## 2024-07-16 MED ADMIN — LACTATED RINGERS IV SOLN: 75 mL/h | INTRAVENOUS | @ 09:00:00 | Stop: 2024-07-18 | NDC 00338011704

## 2024-07-16 MED ADMIN — PANCRELIPASE (LIP-PROT-AMYL) 5000-24000 UNITS PO CPEP: 15000 [IU] | ORAL | @ 16:00:00 | Stop: 2024-07-18 | NDC 73562011501

## 2024-07-16 MED ADMIN — CEFTRIAXONE 1 G IN SWFI IVP: 1 g | INTRAVENOUS | @ 01:00:00 | Stop: 2024-07-17 | NDC 00409733211

## 2024-07-16 MED ADMIN — PANCRELIPASE (LIP-PROT-AMYL) 5000-24000 UNITS PO CPEP: 15000 [IU] | ORAL | @ 01:00:00 | Stop: 2024-07-18 | NDC 73562011501

## 2024-07-16 MED ADMIN — PANTOPRAZOLE SODIUM 40 MG PO TBEC: 40 mg | ORAL | @ 13:00:00 | Stop: 2024-07-18 | NDC 60687073609

## 2024-07-16 NOTE — Consults
 IP CM ACTIVE DISCHARGE PLANNING  Department of Care Coordination      Admit [phone]  Anticipated Date of Discharge: 07/17/2024    Following FI:Rynp, Camellia BIRCH., MD    Today's Short Update   I sent Hospice order via [facility]. I called them [phone] and confirmed on service with them. CM to f/u.      Disposition   Expected Disposition: Assisted Living Facility  Post-Acute Recommendations: Hospice  Discharge Address: [name] [facility] [place] [address]  Family/Support System in agreement with the current discharge plan: Yes, in agreement and participating      FREEDOM OF CHOICE/PATIENT CHOICE   Treatment Preferences: Addressed  Discharge Goals: Addressed  Freedom of Choice: Hospice  Aidin Choice List: Not Applicable  Comment: Wants to resume [facility]isted [facility] when discharged        Multidisciplinary Team Member Plan of Care   Interdisciplinary rounds were conducted with the multidisciplinary team including the clinical social worker and nurse case manager. The patient's plan of care and discharge plan were discussed and formulated based on the patient's specific needs.                                      Charlie Satchel, RN CM  [facility]  Ph: [phone]  Fax [phone]

## 2024-07-16 NOTE — Consults
 CLINICAL SOCIAL WORKER ADULT ASSESSMENT      Admit Date:07/12/2024    Date of CSW Assessment: 07/16/2024    Problems: Active Problems:    * No active hospital problems. *       Past Medical History:   Diagnosis Date    Anxiety     Cancer (HCC/RAF)     Dementia (HCC/RAF)     Depression     Histoplasmosis     Past Surgical History:   Procedure Laterality Date    ABDOMINAL SURGERY      BREAST SURGERY          Primary Care Physician:Karp Ulysses Chiquita HERO., MD  Phone:[phone]    LANGUAGE ASSISTANCE:   Language Assistance  Language Resource Used?: Patient declined    GENERAL ASSESSMENT:     Date Seen: 07/16/24  Date Referred: 07/16/24  Time In:     Time Out:      Referral Source:  Other (comment) Sales promotion account executive of Case Managment and Social Service)    Reason for Referral:  Other (comment) (transition of care)    Source of Information: Review of medical record, Persons interviewed               Interview Source(s): [name] pt's daughter            Advance Directive:  Yes       Type: Enduring       Identified Medical Decision Maker(s):: [name] Bowen, daughter and Kiamesha Samet, spouse            Conservatorship:  No            Psychiatric History:  Yes       Diagnosis: Depression                                            History of Substance Use:  No           SOCIAL ASSESSMENT:     Current Living Situation:  Facility            Support Systems:  Spouse/significant other, Parent    Contact Name:  [name] Bowen    Phone Number:  [phone]    Does the patient have a Family/Support System member participating in   Discharge Planning? Yes    Family/Support System in agreement with the current discharge plan? Yes, in agreement and participating    Patient Education:       Patient Employment:  Retired    Income Source:  Social Security    Medi-cal Eligibility/Referral Screening:       [employer] Utilized:       Additional Stressors/Losses:  None    Religion/Cultural or Language Factors:       No             COMPREHENSION OF ILLNESS:     Patient Health Literacy:  Adequate    Patient Understanding of Dx/Prognosis/Current Condition:  Adequate      MOTIVATION / COMPLIANCE / COPING STYLE / STRENGTHS:       Patient Compliant with Appointments:   Yes    Patient Compliant with Medications:  Yes    Coping Style:  problem solving    Strengths:  strong support system, living arrangement, financial means    Patient/Family Issues:  No      CLINICAL ASSESSMENT:   The patient is  an 83 year old female with hx of Dementia admitted to the hospital from [facility] facility with chief complaint of Generalized Weakness. Chart review conducted. This LCSW  met with the pt and [name] pt's daughter/POA at the bedside.informed about LCSW's role and the reason for visit. Verbal permission obtained from [name] to proceed with assessment. The pt is unable to participate in the assessment due to current mental status. [name] appears to adequately understand pt's diagnosis, prognosis and treatment plan. According to [name], the pt resides at [facility] facility, and she is planning for the pt to return there. [name] informed this LCSW that the pt was receiving hospice care from [facility] prior to this hospitalization and she would like to resume hospice care post discharge. No social service needs and/or concerns identified by [name] at this time. This LCSW provided supportive counseling and active listening. This LCSW encouraged [name]  to contact this social service department for support, questions, and/or concerns. This LCSW collaborated with Charlie DINGS, MD and RN notified of above. This LCSW and will remain available as needed.   INTERVENTION / PLAN:     Projected Date of Discharge: 07/17/2024    Interventions:  Discussed role of CSW and informed CSW availability for support and resources, Provided business card with contact info for CSW, Provided education regarding, Gathered history, Provided empathetic listening and emotional support, Processed and explored feelings related to diagnosis/treatment, Assessed high risk issues, Assessed for barriers to discharge, Coordinated care with multidisciplinary team    Outcome:  no social service needs identified at this tine    Plan/Recommendations:  Discharge to [facility] [employer], [place],  07/16/2024

## 2024-07-16 NOTE — Nursing Note
 Patient arrived from 6th floor. Received patient with bilateral mittens. Patient on careview. Patient belongings: one shirt, pair of black shoes, and fan. Skin checked with second RN, Pam. Pictures taken. Perineal care done. Foley care done.Turned and repositioned.

## 2024-07-16 NOTE — Consults
 CASE MANAGER ASSESSMENT    Admit Ijuz:[id number]    Date of Initial CM Assessment: 07/16/2024    Problems: Active Problems:    * No active hospital problems. *       Past Medical History:   Diagnosis Date    Anxiety     Cancer (HCC/RAF)     Dementia (HCC/RAF)     Depression     Histoplasmosis     Past Surgical History:   Procedure Laterality Date    ABDOMINAL SURGERY      BREAST SURGERY          Primary Care Physician:Karp Ulysses Chiquita HERO., MD  [phone]    LANGUAGE ASSISTANCE          NEEDS ASSESSMENT   Is the patient able to participate in the CM Initial Assessment at this time?: No - Patient with AMS  Information Obtained From:  (Daughter susan [phone])  Level of Function Prior to Admit: Maximum Assist  Pre-admission Living Situation: Assisted Living Facility  Facility Name: [facility]  Facility Phone Number: [phone]  Who is your PCP?: KARP WIEFEL, CHIQUITA HERO.  Do you need information/education regarding your medical condition?: No      READMIT ASSESSMENT   Is this a planned readmission?: No  Interview is with: Patient & Family/Caregiver  In your opinion, what brought you back to the hospital?: Change in medical condition  Did you receive your DC instructions from your previous DC?: Yes  Was there anything missing from or unclear with your discharge instructions?: No  Did you speak with your Doctor before coming to the hospital?: Yes  Are you able to take ALL your medications the way they have been prescribed?: Yes  Do you feel isolated/lonely?: No             Charlie Satchel, RN CM  [facility]  Ph: [phone]  Fax [phone]

## 2024-07-16 NOTE — Other
 Neuro: Exceptions to WDL   BMAT: Level 1 - Red  Assistive Device:      Vitals:    Temp:  [36.3 ?C (97.3 ?F)-37 ?C (98.6 ?F)] 36.7 ?C (98.1 ?F)  Heart Rate:  [83-97] 85  Resp:  [16-18] 18  BP: (109-163)/(53-76) 115/53  NBP Mean:  [73-105] 73  SpO2:  [92 %-99 %] 92 %  Cardiac:     Ectopy:   Respiratory:           Pain:    Last BM: 07/16/24  GU: Indwelling urinary catheter  Skin: Exceptions to WDL  Lines/Tubes/Drains:   Urethral Catheter Standard 16 Fr. (5)  Peripheral IV 22 G Left;Posterior Hand (4)           Expected Discharge Disposition: Assisted Living Facility     Expected Discharge Date: 07/17/2024   Patient's Clinical Goal:   Clinical Goal(s) for the Shift: Wound care  Identify possible barriers to advancing the care plan:   Stability of the patient: Moderately Stable - low risk of patient condition declining or worsening   Progression of Patient's Clinical Goal:   Patient is alert and oriented x1 vitals are stable and afebrile. Patient had one bowel movement. Dressing changed on Coccyx left hip. Care view maintained. Soft mittens maintained. Patient denies any pain. Medications given crushed. IV abx given.Potassium replaced Possible DC tomorrow.

## 2024-07-16 NOTE — Consults
 CASE MANAGER ASSESSMENT    Admit Ijuz:[id number]    Date of Initial CM Assessment: 07/16/2024    Problems: Active Problems:    * No active hospital problems. *       Past Medical History:   Diagnosis Date    Anxiety     Cancer (HCC/RAF)     Dementia (HCC/RAF)     Depression     Histoplasmosis     Past Surgical History:   Procedure Laterality Date    ABDOMINAL SURGERY      BREAST SURGERY          Primary Care Physician:Karp Ulysses Chiquita HERO., MD  Phone:[phone]    LANGUAGE ASSISTANCE        No  NEEDS ASSESSMENT   Is the patient able to participate in the CM Initial Assessment at this time?: No - Patient with AMS  Information Obtained From:  (Daughter susan [phone])  Level of Function Prior to Admit: Maximum Assist  Pre-admission Living Situation: Assisted Living Facility  Facility Name: [facility]  Facility Phone Number: [phone]  Who is your PCP?: KARP WIEFEL, CHIQUITA HERO.  Do you need information/education regarding your medical condition?: No      READMIT ASSESSMENT   Is this a planned readmission?: No  Interview is with: Patient & Family/Caregiver  In your opinion, what brought you back to the hospital?: Change in medical condition  Did you receive your DC instructions from your previous DC?: Yes  Was there anything missing from or unclear with your discharge instructions?: No  Did you speak with your Doctor before coming to the hospital?: Yes  Are you able to take ALL your medications the way they have been prescribed?: Yes  Do you feel isolated/lonely?: No         RISK STRATIFICATION   > 2 admissions within the last 12 months?: No  Multiple co-morbidities?: No      DISCHARGE ASSESSMENT   Projected Date of Discharge: 07/17/2024    Anticipated Complex D/C?: No  Projected Discharge to: Assisted Living Facility  Discharge Address: [address] [facility] [place] [address]  Projected Discharge Needs: Hospice  Support Identified at Discharge: Spouse, Child  Who is available to transport you upon discharge?: Correne Fleeta Charlie Tama, RN CM  [facility]  Ph: [phone]  Fax [phone]

## 2024-07-16 NOTE — Nursing Note
 Transferred pt to 401 - 2. Report given to RN Iris.    Pt is alert x1, Vitals stable.

## 2024-07-16 NOTE — Progress Notes
 [facility]  HOSPITALIST PROGRESS NOTE      For questions from 7am to 5pm, please message me on Epic Chat.     After 5 PM PST, PAGE [id number] for [facility] patients        Patient Name: Jodi Bentley     DOB: [birth year]  MRN: [id number]  PCP: Jodi Ulysses Chiquita CHRISTELLA., MD Admission: 07/12/2024    Date of Service: 07/16/2024  Hospital Day: Hospital Day: 6       Principle Problem: <principal problem not specified>    Chief Complaint: Generalized Weakness (BIBA FROM THE [name], CAREGIVER NOTICED THAT SHE WAS WEAKER THAN NORMAL AROUND 4PM. PT IS NORMALLY ABLE TO FEED SELF AND WAS UNABLE TO THIS AFTERNOON. SHE IS DNR/DNI. HX DEMENTIA)    SUBJECTIVE/INTERVAL EVENTS     ID: Jodi Bentley is a 83 y.o. female with PMH significant for Dementia presenting with concern for significant decline in pt's function and concern for UTI.     9/25  - family wishes to proceed with hospice  - finish course of antibiotics tomorrow  - continue IVF  - patient slightly more awake today    Subjective (click to expand/collapse)    9/24  - change to CTX  - patient a little more somnolent today  - spoke with family at bedside  - will need HH vs palliative care on discharge, wishes to stop hospice  - she wants to continue treating UTI's although does not want more aggressive treatments  - given no imminent life threatening illness, that seems reasonable but may preclude hospice given wants to continue abx for UTIs    9/23  - proteus is resistant only to macrobid   - Alyssa also in culture, resistance pending  - continue ertapenem  for now  - today, awakes, oriented to self    9/22  -Aox2  -UCx prelim 100K Proteus/ Lactose pos GNR  -Cont Ertapenem      9/21  - vitals unremarkable, afebrile, on 2L NC though satting 98-100  - bld cx NGTD  - urine cx in process  - WBC 12.8 -> 12.5  - alk phos mildly high at 153, albumin low at 2.9  - UA is positive with > 1000 WBC  - foley catheter exchanged in the ER  - previously on hospice care, POLST reviewed, DNR  - continue ertapenem     Per admit H&P:  ''  [name] is a 83 y.o. female with PMH significant for Dementia presenting with concern for significant decline in pt's function and concern for UTI.     I was unable to reach family for collateral at the time of my assessment, and thus relied on history already taken. Pt not able to participate in any significantly meaningful way.     Per ED note, the patient was weaker than normal the day of admission compared to the previous day. Pt has also experienced significant deterioration over the past 8 to 12 weeks, with an accelerated decline in her verbal communication, now being mostly nonverbal and only able to mumble. Pt has a history of recurrent UTIs and the odor of her urine made family suspect a UTI. Thus patient was brought in.     Pt's code status was confirmed DNR/DNI with my conversations with ED staff.     ED Course:  Basic labs acquired  UA + Ucx  Ertapenem  1g x1  Lovenox  40mg    Admitted ''  ASSESSMENT/PLAN       # Catheter Associated UTI, POA - chronic catheter, exchanged in ER  # Hx ESBL E Coli  # Metabolic encephalopathy due to UTI superimposed on Dementia   Pt with UA strongly suggestive of UTI, with leukocytosis but no other SIRS criteria. Previous Ucx in 04/2024 grew ESBL E Coli. Bcx no growth. Ucx prelim Proteus and GNR 100k. SLP recs for Puree and thin liquids.  - s/p Ertapenem  1g (9/21); c/w Ertapenem  1g (9/21-) x7d for ESBL UTI -> changed to ceftriaxone   - IVF iso concern for possible aspiration  - aspiration precautions     Plan:  Treat UTI    Chronic, Stable Problems:  # Dementia  Unclear home regimen  - Melatonin at bedtime and Zyprexa  PRN for agitation  -Cont PTA Quetiapine  12.5mg  nightly  -Cont PTA Rivastigmine  4.5mg  2 times daily before meals  -Cont PTA Venlafaxine  300mg  daily  -Cont PTA Buspirone  15mg  TID     #Incontinence Associated Dermatitis and pressures heel erythema  Wound care recs appreciated for sacrococcyx, gluteal, perineal, and vaginal dermatitis     Puree and Thin Liquids  VTE Prophylaxis: Lovenox   GI Prophylaxis: None  Tubes/Drains: NOne     Activity: Rest  Isolation: None  Precautions: Aspiration  Monitoring: None    CHECKLIST:     DNR (No CPR, defibrillation, intubation),  Primary Emergency Contact: [name],[name], Home Phone: [phone], Mobile Phone: [phone]    Diet: Diet pureed  Oral nutrition supplements Breakfast, Dinner; Boost VHC Chocolate    VTE Prophylaxis: Prophylactic Lovenox     LDA: Urethral Catheter Standard 16 Fr. (5)  Peripheral IV 22 G Left;Posterior Hand (4)       Disposition:  Inpatient, Expected post-hospitalization disposition will be to Home.    OBJECTIVE         Objective (click to expand/collapse)     Scheduled medications   busPIRone   15 mg Oral TID    cefTRIAXone   1 g IV Push Q24H    [START ON 07/17/2024] enoxaparin   40 mg Subcutaneous Q24H    nystatin    Topical BID    pancrelipase  (lip-prot-amyl)  15,000 units of lipase Oral TID w/meals    pantoprazole   40 mg Oral QAM AC    QUEtiapine   12.5 mg Oral QHS    rivastigmine   4.5 mg Oral BID AC    venlafaxine   300 mg Oral Daily    vitamin D (cholecalciferol )  25 mcg Oral Daily       Continuous medications   lactated ringers  75 mL/hr (07/16/24 0134)         PRN medications:   acetaminophen , haloperidol, hyoscyamine, magnesium sulfate IV **OR** magnesium sulfate IV, potassium chloride  IV (peripheral) **OR** potassium chloride  IV (peripheral) **OR** potassium chloride  IV (peripheral) **OR** potassium chloride  IV (peripheral)    Vitals Current 24 Hour Min / Max      Temp    36.3 ?C (97.3 ?F)    Temp  Min: 36.3 ?C (97.3 ?F)  Max: 37 ?C (98.6 ?F)      BP     132/65     BP  Min: 109/62  Max: 163/76      HR    97    Pulse  Min: 83  Max: 97      RR    18    Resp  Min: 16  Max: 18      SpO2    98 %     SpO2  Min: 95 %  Max: 99 %      Weight    53.1 kg (117 lb 1 oz)     Admit: 59 kg (130 lb)       Intake/Output Summary (Last 24 hours) at 07/16/2024 1503  Last data filed at 07/16/2024 1400  Gross per 24 hour   Intake 1010 ml   Output 995 ml   Net 15 ml         PHYSICAL EXAM:    System Normal Findings Abnormal Findings   General []  Normal general appearance   []  In acute distress  [x]  Chronically ill appearing  []  Sedated           []  Non Verbal  []  Other:   HENMT/Neck []  Oropharynx clear and moist  [x]  Normal JVP []  Dry mucous membranes  []  JVP ( cm)  []  Other:   Resp [x]  CTA B/L []  Intubated                 []  On Supp O2 ()  []  Wheezing ()  []  Rales ()   []  Crackles ()  []  Other:   CV [x]  Normal rhythm/rate  [x]  No murmurs  []  No lower extremity edema  []  Normal PMI   Normal pulses:   []  Radial []  Femoral  []  Pedal []   Abnormal rhythm ()  []   Murmur ()  []  S3    []  S4  Abnormal Pulses:  []  Radial ()  []  Femoral ()   []  DP ()  []  PT ()  []  Lower extremity edema       []  R ()   []  L ()  []  Bilateral ()  []  Other:   GI [x]  Soft  []  No tenderness to palpation  []  No rebound/guarding []  Tenderness ()  []  Fluid Wave  []  Hepatomegaly  Other:   MSK []  Normal strength/tone []  Other:    Skin []  No rash []  Other:   Neuro []  AO x 3  []  Normal strength  []  Normal sensation  []  Normal reflexes  []  Normal cerebellar exam [x]  AO x 0       []  AO x 1     []  AO x 2  Abnormal neuro exam:  []  Abnormal strength  []  Abnormal sensation  []  Abnormal reflexes  []  Abnormal cerebellar exam       Labs    CBC  Recent Labs     07/16/24  0443 07/15/24  0950 07/14/24  0450   WBC 13.10* 16.81* 11.11*   HGB 9.9* 11.9 10.1*   HCT 30.8* 36.8 31.9*   MCV 93.9 93.6 93.8   PLT 232 308 256       Coags  No results for input(s): ''INR'', ''PT'', ''APTT'' in the last 72 hours.    Blood gas  No results for input(s): ''PHART'', ''PCO2ART'', ''PO2ART'', ''BICARBART'', ''BEART'' in the last 72 hours.    BMP  Recent Labs     07/16/24  0443 07/15/24  0950   NA 144 141   K 3.2* 3.6   CL 110* 109*   CO2 28 31   BUN 19* 16   CREAT 1.00 1.20   CALCIUM  8.1* 8.7   MG 1.7 1.7   PHOS 3.1 3.4       LFT  Recent Labs     07/16/24  0443 07/15/24  0950   TOTPRO 5.2* 6.1*   ALBUMIN 2.4* 2.9*   BILITOT 0.4 0.5   ALT 15 16  AST 13* 15   ALKPHOS 129* 153*         Micro    Recent Results (from the past 4 weeks)   Bacterial Culture Blood    Collection Time: 07/11/24  9:58 PM    Specimen: Peripheral Vein; Blood   Result Value Ref Range    Blood Culture - Preliminary status Negative To Date Negative   Bacterial Culture Urine    Collection Time: 07/11/24 10:09 PM    Specimen: Clean Catch, Midstream; Urine   Result Value Ref Range    Bacterial Culture Urine >100,000 CFU/mL Proteus mirabilis (A)     Bacterial Culture Urine >100,000 CFU/mL Klebsiella pneumoniae (A)        Susceptibility    Klebsiella pneumoniae - MIC (MCG/ML)*     Amoxicillin + Clavulanate <=2 Susceptible      Ampicillin R Resistant      Oral Cephalosporins S Susceptible      Ceftriaxone  <=0.25 Susceptible      Ciprofloxacin  <=0.06 Susceptible      Levofloxacin <=0.12 Susceptible      Nitrofurantoin  64 Intermediate      Tobramycin <=1 Susceptible      Trimethoprim/Sulfamethoxazole <=20 Susceptible      * Nitrofurantoin  should not be used in patients with CrCl < 30 mL/min or in patients with suspected or confirmed pyelonephritis.    Oral Cephalosporins include Cefpodoxime and Cephalexin for treatment of uncomplicated urinary tract infections.      Proteus mirabilis - MIC (MCG/ML)*     Amoxicillin + Clavulanate <=2 Susceptible      Ampicillin <=2 Susceptible      Oral Cephalosporins S Susceptible      Ceftriaxone  <=0.25 Susceptible      Ciprofloxacin  <=0.06 Susceptible      Levofloxacin <=0.12 Susceptible      Nitrofurantoin  128 Resistant      Tobramycin <=1 Susceptible      Trimethoprim/Sulfamethoxazole <=20 Susceptible      * Nitrofurantoin  should not be used in patients with CrCl < 30 mL/min or in patients with suspected or confirmed pyelonephritis.    Oral Cephalosporins include Cefpodoxime and Cephalexin for treatment of uncomplicated urinary tract infections.     Bacterial Culture Blood Collection Time: 07/11/24 10:24 PM    Specimen: Peripheral Vein #2; Blood   Result Value Ref Range    Blood Culture - Preliminary status Negative To Date Negative         Radiology    No results found.       AUTHOR:  Camellia BIRCH. Rojelio, MD  07/16/2024 at 3:03 PM

## 2024-07-17 LAB — Blood Culture Detection
BLOOD CULTURE FINAL STATUS: NEGATIVE
BLOOD CULTURE FINAL STATUS: NEGATIVE

## 2024-07-17 LAB — Phosphorus: PHOSPHORUS: 1.6 mg/dL — ABNORMAL LOW (ref 2.5–4.9)

## 2024-07-17 LAB — Magnesium: MAGNESIUM: 1.6 meq/L (ref 1.5–2.0)

## 2024-07-17 LAB — CBC: ABSOLUTE NUCLEATED RBC COUNT: 0 x10E3/uL (ref 0.00–0.00)

## 2024-07-17 LAB — Differential Automated: EOSINOPHIL PERCENT, AUTO: 3.2 % (ref 1.80–6.90)

## 2024-07-17 LAB — Comprehensive Metabolic Panel: BILIRUBIN,TOTAL: 0.4 mg/dL (ref 0.2–1.0)

## 2024-07-17 MED ORDER — POTASSIUM & SODIUM PHOSPHATES 280-160-250 MG PO PACK
1 | Freq: Three times a day (TID) | ORAL
Start: 2024-07-17 — End: ?

## 2024-07-17 MED ADMIN — QUETIAPINE FUMARATE 25 MG PO TABS: 12.5 mg | ORAL | @ 04:00:00 | Stop: 2024-07-18 | NDC 60687032711

## 2024-07-17 MED ADMIN — CEFTRIAXONE 1 G IN SWFI IVP: 1 g | INTRAVENOUS | @ 21:00:00 | Stop: 2024-07-17 | NDC 00409733211

## 2024-07-17 MED ADMIN — BUSPIRONE HCL 5 MG PO TABS: 15 mg | ORAL | @ 04:00:00 | Stop: 2024-07-18 | NDC 29300024401

## 2024-07-17 MED ADMIN — VENLAFAXINE HCL ER 75 MG PO CP24: 300 mg | ORAL | @ 16:00:00 | Stop: 2024-07-18 | NDC 68084070911

## 2024-07-17 MED ADMIN — POTASSIUM CHLORIDE 40 MEQ/500 ML NS RTU (PERIPHERAL) (BATCHED): 40 meq | INTRAVENOUS | @ 21:00:00 | Stop: 2024-07-18 | NDC 00000201019

## 2024-07-17 MED ADMIN — ENOXAPARIN SODIUM 40 MG/0.4ML IJ SOSY: 40 mg | SUBCUTANEOUS | @ 13:00:00 | Stop: 2024-07-17 | NDC 71288043382

## 2024-07-17 MED ADMIN — POTASSIUM CHLORIDE 40 MEQ/500 ML NS RTU (PERIPHERAL) (BATCHED): 40 meq | INTRAVENOUS | @ 01:00:00 | Stop: 2024-07-18 | NDC 00000201019

## 2024-07-17 MED ADMIN — BUSPIRONE HCL 5 MG PO TABS: 15 mg | ORAL | @ 13:00:00 | Stop: 2024-07-18 | NDC 29300024401

## 2024-07-17 MED ADMIN — POTASSIUM & SODIUM PHOSPHATES 280-160-250 MG PO PACK: 1 | ORAL | @ 23:00:00 | Stop: 2024-07-18 | NDC 60258000601

## 2024-07-17 MED ADMIN — MAGNESIUM SULFATE 2 GM/50ML IV SOLN: 2 g | INTRAVENOUS | @ 16:00:00 | Stop: 2024-07-18 | NDC 83634050081

## 2024-07-17 MED ADMIN — RIVASTIGMINE TARTRATE 1.5 MG PO CAPS: 4.5 mg | ORAL | @ 23:00:00 | Stop: 2024-07-18 | NDC 60687077711

## 2024-07-17 MED ADMIN — CEFTRIAXONE 1 G IN SWFI IVP: 1 g | INTRAVENOUS | @ 01:00:00 | Stop: 2024-07-17 | NDC 00409733211

## 2024-07-17 MED ADMIN — CHOLECALCIFEROL 25 MCG (1000 UT) PO TABS: 25 ug | ORAL | @ 16:00:00 | Stop: 2024-07-18

## 2024-07-17 MED ADMIN — NYSTATIN 100000 UNIT/GM EX POWD: TOPICAL | @ 04:00:00 | Stop: 2024-07-18 | NDC 00574200815

## 2024-07-17 MED ADMIN — PANCRELIPASE (LIP-PROT-AMYL) 5000-24000 UNITS PO CPEP: 15000 [IU] | ORAL | @ 16:00:00 | Stop: 2024-07-18 | NDC 73562011501

## 2024-07-17 MED ADMIN — NYSTATIN 100000 UNIT/GM EX POWD: TOPICAL | @ 16:00:00 | Stop: 2024-07-18 | NDC 00574200815

## 2024-07-17 MED ADMIN — BUSPIRONE HCL 5 MG PO TABS: 15 mg | ORAL | @ 19:00:00 | Stop: 2024-07-18 | NDC 29300024401

## 2024-07-17 MED ADMIN — PANTOPRAZOLE SODIUM 40 MG PO TBEC: 40 mg | ORAL | @ 13:00:00 | Stop: 2024-07-18 | NDC 50268063911

## 2024-07-17 MED ADMIN — LACTATED RINGERS IV SOLN: 75 mL/h | INTRAVENOUS | @ 18:00:00 | Stop: 2024-07-18 | NDC 00338011704

## 2024-07-17 MED ADMIN — PANCRELIPASE (LIP-PROT-AMYL) 5000-24000 UNITS PO CPEP: 15000 [IU] | ORAL | @ 01:00:00 | Stop: 2024-07-18 | NDC 73562011501

## 2024-07-17 MED ADMIN — PANCRELIPASE (LIP-PROT-AMYL) 5000-24000 UNITS PO CPEP: 15000 [IU] | ORAL | @ 19:00:00 | Stop: 2024-07-18 | NDC 73562011501

## 2024-07-17 MED ADMIN — RIVASTIGMINE TARTRATE 1.5 MG PO CAPS: 4.5 mg | ORAL | @ 13:00:00 | Stop: 2024-07-18 | NDC 60687077711

## 2024-07-17 NOTE — Goals of Care
 Advance Care Planning   Goals of Care                  Long goals of care conversation with patient's daughter [name].  Patient had already been on hospice prior.  She has been diagnosed with dementia with progressive and rapid decline in her function.  She has a Foley catheter, seen outpatient by urologist and thought unlikely to regain function.  Possibility of suprapubic catheter had been raised.  She came in this hospital admission for UTI due to underlying catheter.  Catheter was exchanged inpatient finished a 7 day course of IV antibiotics.  Overall patient appears slightly delirious from her baseline but close to it.  I am worried that she will not be able to take sufficient p.o. intake due to progression of her underlying disease.  Daughter asked multiple questions which I answered and fully address.  Joint decision-making was made to have patient return to hospice with the understanding that if she does not take sufficient p.o. intake she may pass away within the next several weeks.    Jodi Bentley. Rojelio

## 2024-07-17 NOTE — Nursing Note
 Name: Jodi Bentley  Medical Record Number: [id number]  Date of Admission: 07/12/2024    Date of Discharge: 07/17/2024  Discharge Destination: Home to [name] with Assisted hospice    Attending Physician: Rojelio Camellia BIRCH., MD    Diagnosis:  UTI (urinary tract infection) [N39.0]  Complicated UTI (urinary tract infection) [N39.0]      Vitals:  Temp:  [36.7 ?C (98.1 ?F)-37.2 ?C (99 ?F)] 37 ?C (98.6 ?F)  Heart Rate:  [79-90] 79  Resp:  [17-18] 17  BP: (129-157)/(61-72) 147/68  NBP Mean:  [90-100] 95  SpO2:  [93 %-96 %] 95 %    Discharge Assessment:   Patient meets discharge criteria; Patient is alert and oriented x1, room air, VSS. No s/s of pain on this shift. IV access discontinued with catheter tip intact. Per Maricris, RN no need to give full report. All paperwork sent with transport. Patient medically cleared by Dr. Rojelio. Per MD, ok that patient did not finish complete K+ IV, but had potassium phosphate that will be continued at facility. Patient left with chronic urinary catheter.  Patient transported via gurney with transportation services. Gold bracelet on her person.

## 2024-07-17 NOTE — Other
 Patient's Clinical Goal:   Clinical Goal(s) for the Shift: wound care, rest, safety  Identify possible barriers to advancing the care plan:   Stability of the patient: Moderately Stable - low risk of patient condition declining or worsening   Progression of Patient's Clinical Goal:     Admit Dx: UTI (urinary tract infection) [N39.0]  Complicated UTI (urinary tract infection) [N39.0]    Vitals:  Temp:  [36.3 ?C (97.3 ?F)-37 ?C (98.6 ?F)] 37 ?C (98.6 ?F)  Heart Rate:  [83-97] 90  Resp:  [16-18] 18  BP: (115-155)/(53-72) 148/61  NBP Mean:  [73-96] 90  SpO2:  [92 %-98 %] 96 %    Pt alert and oriented to self, room air. On puree diet. IV fluids infusing. Foley draining. BM x 2. Peri-care/wound care done. Bed in low locked position, on careview, call light within reach, hourly rounds.

## 2024-07-17 NOTE — Consults
 FINAL DISCHARGE MULTIDISCIPLINARY NOTE  Department of Care Coordination      Admit Ijuz:[id number]  Anticipated Date of Discharge: 07/17/2024    Following FI:Rynp, Camellia BIRCH., MD    Home Address:[address]  [place] [address]    DISCHARGE INFORMATION   Discharge Address: [address] [place] [address]    Individual(s) notified of discharge plan:  Contact Name: [name]  Relationship: Daughter  Contact Number(s): [phone]    Is patient/family informed of discharge?: Yes  Is patient/family agreeable of discharge destination?: Yes  Support Systems: Family  Medicare Important Message Provided: Yes  Final Discharge Needs: Hospice, Transportation Arrangements  Is this patient eligible for [facility]?: Yes  Has the patient been informed that they will be contacted by the [facility] program?: Yes      FREEDOM OF CHOICE/PATIENT CHOICE   Treatment Preferences: Addressed  Discharge Goals: Addressed  Freedom of Choice: Hospice  Aidin Choice List: Not Applicable  Comment: Wants to resume Assisted Hospice when discharged               Hospice   Type of Hospice: Home Hospice  Agency Name[facility]  Contact Person: intake  Phone Number(s): [phone]  Start of Care Date: 07/17/24            Transportation Arrangements   Transfer Date: 07/17/24  Time: 1600  Transportation Type: Non-emergent transportation  [place]: [employer]  Phone Number: [phone]        Charlie Satchel, RN CM  [facility]  Ph: [phone]  Fax [phone]

## 2024-07-17 NOTE — Discharge Summary
 Discharge Summary   Name: Jodi Bentley MRN: [id number] DOB: April 11, 1941   Admit Date: 07/12/2024 D/C Date:    LOS:   5 days    Admit Attending: Pranav R. March, MD Discharge Attending: Rojelio Camellia BIRCH., MD    PCP: Lajean [place], Chiquita HERO., MD Discharge Provider: Camellia BIRCH. Rojelio, MD     Inpatient Care Team/ Consults:  Treatment Team:   Team: HOSPITALIST [facility] - TEAM 5  Case Manager: Tama Charlie Gerlach     Outpatient Care Team  No care team member to display     Admission Diagnosis:  (reason for admission)    # Catheter Associated UTI, POA - chronic catheter, exchanged in ER  # Hx ESBL E Coli  # Metabolic encephalopathy due to UTI superimposed on Dementia   # Dementia   #Incontinence Associated Dermatitis and pressures heel erythema  Discharge Diagnosis:  (conditions treated during hospitalization)    # Catheter Associated UTI, POA - chronic catheter, exchanged in ER  # Hx ESBL E Coli  # Metabolic encephalopathy due to UTI superimposed on Dementia   # Dementia   #Incontinence Associated Dermatitis and pressures heel erythema   # CKD 3a   Disposition:  Home or Self care and Hospice care at home      Discharge Condition:  serious       HPI:   Per admit H&P    ''  Jodi Bentley is a 83 y.o. female with PMH significant for Dementia presenting with concern for significant decline in pt's function and concern for UTI.     I was unable to reach family for collateral at the time of my assessment, and thus relied on history already taken. Pt not able to participate in any significantly meaningful way.     Per ED note, the patient was weaker than normal the day of admission compared to the previous day. Pt has also experienced significant deterioration over the past 8 to 12 weeks, with an accelerated decline in her verbal communication, now being mostly nonverbal and only able to mumble. Pt has a history of recurrent UTIs and the odor of her urine made family suspect a UTI. Thus patient was brought in.     Pt's code status was confirmed DNR/DNI with my conversations with ED staff.     ED Course:  Basic labs acquired  UA + Ucx  Ertapenem  1g x1  Lovenox  40mg  ''    Hospital Course:      83 year old female with above past medical history presented with UTI.  She has a history of ESBL UTI and was started empirically on ertapenem .  Urine cultures were obtained and grew out E coli and Proteus sensitive to ceftriaxone .  Antibiotics were changed to ceftriaxone .  She finished a 7 day course of IV antibiotics.  Patient had intermittent delirium with change of her day night cycle.  Multiple discussions were held with patient's daughter.  Given patient's rapid dementia, daughter opted to take patient to hospice.    I explained to daughter that as UTI symptoms completely resolved patient may be able to start taking oral intake again.  However natural progression of dementia which have decreased p.o. intake and given artificial means of nutrition is not within patient's goals of care, hospice is appropriate.      Outpatient Provider To-Do List    Symptom management per hospice        LACE+ Score: 78 (07/17/24 0801) High Risk of Readmission  Better Outcomes by Optimizing  Safe Transitions - GERIATRIC CONSIDERATIONS     This patient meets criteria for the following BOOST risk conditions which could lead to readmission. Please consider further evaluation or action on the potential barriers below:    Problems with Medications:      11 scheduled medications active at discharge      Physical Limitation:    BMAT Level 1: Dependent and unable to move or transfer  [place] Fall Risk Screen: High risk for falls    Disposition:    PT Concerns: Requires assistance for self care, Requires assistance for mobility  Pt Recommends: Owns recommended DME, Physical Therapy, Would benefit from continued therapy      Palliative Care:     Patient may have a life-limiting illness      Prior Hospitalizations:    1 prior inpatient admission(s) in past 6 months      Health Literacy & Communication:                    Pending Labs   The following tests have been collected, but not yet resulted at the time of discharge.    Unresulted Labs (From admission, onward)      None          The following labs have a preliminary result at the time of discharge.  Please check back if the final results have changed.  Preliminary Resulted Labs (From admission, onward)      None          Discharge Medication List  Coumadin and Opiate Risk Assessment   This patient does not have coumadin on their discharge med list  Controlled meds:   Controlled Medications       Opioid Combinations Disp Start End     HYDROcodone-acetaminophen  5-325 mg tablet --  --    Sig - Route: Take 1 tablet by mouth every four (4) hours as needed for Moderate Pain (Pain Scale 4-6) or Severe Pain (Pain Scale 7-10). - Oral    Class: Historical Med       Opioid Agonists Disp Start End     morphine 100 mg/5 mL SOLN concentrated solution --  --    Sig - Route: Take 0.25 mLs by mouth every hour as needed for Pain. Max Daily Amount: 6 mLs - Oral    Class: Historical Med       Benzodiazepines Disp Start End     LORazepam 0.5 mg tablet --  --    Sig - Route: Place 1 tablet (0.5 mg total) under the tongue every four (4) hours as needed for Anxiety. Max Daily Amount: 3 mg - Sublingual    Class: Historical Med     LORazepam 2 mg/mL concentrated solution --  --    Sig - Route: Take 0.25 mLs by mouth every four (4) hours as needed for Anxiety. Max Daily Amount: 1.5 mLs - Oral    Class: Historical Med              Changes To My Medications      CHANGE how you take these medications     amLODIPine  10 mg tablet; Take 10 mg once daily as needed for systolic   blood pressure above 160 mmHg.; Quantity: 30 tablet; Refills: 0; Commonly   known as: Norvasc ; What changed: how much to take, how to take this, when   to take this, reasons to take this, additional instructions   lidocaine  5% patch; APPLY 1 PATCH TOPICALLY DAILY (  12 HOURS ON AND 12   HOURS OFF) TO LOWER BACK; Quantity: 30 patch; Refills: 11; Commonly known   as: Lidoderm ; What changed: See the new instructions.   loratadine  10 mg tablet; TAKE 1 TABLET BY MOUTH DAILY DX: ALLERGIES;   Quantity: 90 tablet; Refills: 3; Commonly known as: Claritin ; What   changed: See the new instructions.   pantoprazole  40 mg DR tablet; TAKE 1 TABLET BY MOUTH DAILY GIVE AT   6:30AM BEFORE BREAKFAST (DO NOT CRUSH) FOR DX: GERD; Quantity: 90 tablet;   Refills: 3; Commonly known as: Protonix ; What changed: See the new   instructions.   vitamin D (cholecalciferol ) 25 mcg (1000 units) tablet; TAKE 1 TABLET BY   MOUTH DAILY FOR DX: SUPPLEMENT; Quantity: 90 tablet; Refills: 3; What   changed: See the new instructions.     CONTINUE taking these medications     * Acetaminophen  325 MG Caps; Dose: 650 mg; Take 650 mg by mouth every   four (4) hours as needed for Pain or Fever (T>38.5C or 101.77F) (mild to   moderate pain).; Refills: 0   * acetaminophen  650 mg suppository; Dose: 650 mg; Place 1 suppository   (650 mg total) rectally every four (4) hours as needed for Fever (T>38.5C   or 101.77F).; Refills: 0   BALMEX 11.3 % Crea; Dose: 1 Application; Apply 1 Application topically   as needed for (incontinence).; Refills: 0; Generic drug: Zinc Oxide   bisacodyl 10 mg suppository; Dose: 10 mg; Place 1 suppository (10 mg   total) rectally daily as needed for Constipation.; Refills: 0; Commonly   known as: Dulcolax   * busPIRone  15 mg tablet; Dose: 15 mg; Take 1 tablet (15 mg total) by   mouth three (3) times daily.; Quantity: 90 tablet; Refills: 1; Commonly   known as: Buspar    * busPIRone  15 mg tablet; Dose: 15 mg; Take 1 tablet (15 mg total) by   mouth daily as needed (anxiety/agitation).; Refills: 0; Commonly known as:   Buspar    haloperidol 2 mg/mL solution; Dose: 0.5 mL; Take 0.5 mLs (1 mg total) by   mouth every four (4) hours as needed (agitation).; Refills: 0; Commonly   known as: Haldol   HYDROcodone-acetaminophen  5-325 mg tablet; Dose: 1 tablet; Take 1 tablet   by mouth every four (4) hours as needed for Moderate Pain (Pain Scale 4-6)   or Severe Pain (Pain Scale 7-10).; Refills: 0; Commonly known as: Norco   hyoscyamine 0.125 mg tablet; Dose: 125 mcg; Take 1 tablet (125 mcg   total) by mouth every four (4) hours as needed for Cramping.; Refills: 0;   Commonly known as: Levsin   ipratropium-albuterol 0.5-2.5 mg/3 mL inhalation solution; Dose: 3 mL;   Inhale 3 mLs every four (4) hours as needed for Wheezing or Shortness of   Breath.; Refills: 0; Commonly known as: DuoNeb   loperamide 2 mg capsule; Dose: 2 mg; Take 1 capsule (2 mg total) by   mouth every six (6) hours as needed for Diarrhea.; Refills: 0; Commonly   known as: Imodium   * LORazepam 0.5 mg tablet; Dose: 0.5 mg; Place 1 tablet (0.5 mg total)   under the tongue every four (4) hours as needed for Anxiety. Max Daily   Amount: 3 mg; Refills: 0; Commonly known as: Ativan   * LORazepam 2 mg/mL concentrated solution; Dose: 0.25 mL; Take 0.25 mLs   by mouth every four (4) hours as needed for Anxiety. Max Daily Amount: 1.5  mLs; Refills: 0; Commonly known as: Ativan   mineral oil-hydrophilic petrolatum ointment; Dose: 1 g; Apply 1 g   topically two (2) times daily. Apply to feet; Refills: 0   morphine 100 mg/5 mL Soln concentrated solution; Dose: 0.25 mL; Take   0.25 mLs by mouth every hour as needed for Pain. Max Daily Amount: 6 mLs;   Refills: 0   ondansetron ODT 4 mg disintegrating tablet; Dose: 4 mg; Take 1 tablet (4   mg total) by mouth every eight (8) hours as needed for Nausea or   Vomiting.; Refills: 0; Commonly known as: Zofran ODT   pancrelipase  (Lip-Prot-Amyl) 36000 units DR capsule; Dose: 36,000 units   of lipase; Take 1 capsule (36,000 units of lipase total) by mouth three   (3) times daily with meals.; Quantity: 90 capsule; Refills: 11; Commonly   known as: CREON    prochlorperazine 25 mg suppository; Dose: 25 mg; Place 1 suppository (25   mg total) rectally every twelve (12) hours as needed for Nausea or   Vomiting.; Refills: 0; Commonly known as: Compazine   QUEtiapine  25 mg tablet; Dose: 12.5 mg; Take 0.5 tablets (12.5 mg total)   by mouth at bedtime.; Quantity: 45 tablet; Refills: 3; Commonly known as:   SEROquel    rivastigmine  4.5 mg capsule; Dose: 4.5 mg; Take 1 capsule (4.5 mg total)   by mouth two (2) times daily before meals.; Quantity: 180 capsule;   Refills: 1; Commonly known as: Exelon    UNKNOWN TO PATIENT; Dose: 1 g; Apply 1 g topically every six (6) hours   as needed (agitation). C-ABH GEL; Refills: 0   venlafaxine  150 mg 24 hr capsule; Dose: 300 mg; Take 2 capsules (300 mg   total) by mouth daily.; Quantity: 180 capsule; Refills: 1; Commonly known   as: Effexor  XR   vitamin A & D ointment; Dose: 1 g; Apply 1 g topically as needed for for   Dry Skin.; Refills: 0  * This list has 6 medication(s) that are the same as other medications   prescribed for you. Read the directions carefully, and ask your doctor or   other care provider to review them with you.      Requested Appointments  There are no active discharge follow-up orders for this encounter.     Scheduled Appointments  Future Appointments   Date Time Provider Department Center   09/02/2024  1:00 PM Reeta Deitra CROME., MD [facility] [place]   09/04/2024 12:00 PM EIC PCT01 PET [facility]          Discharge Physical Exam/Labs/Imaging/Procedures     Discharge Physical Exam:   BP 157/71 (Patient Position: Lying)  ~ Pulse 85  ~ Temp 37.2 ?C (99 ?F) (Oral)  ~ Resp 17  ~ Ht 1.727 m (5' 8'')  ~ Wt 53.1 kg (117 lb 1 oz)  ~ SpO2 95%  ~ BMI 17.80 kg/m?   General appearance: appears stated age and fatigued  Neck: no adenopathy, no carotid bruit, no JVD, supple, symmetrical, trachea midline, and thyroid not enlarged, symmetric, no tenderness/mass/nodules  Lungs: clear to auscultation bilaterally  Heart: regular rate and rhythm, S1, S2 normal, no murmur, click, rub or gallop  Abdomen: soft, non-tender; bowel sounds normal; no masses,  no organomegaly  Extremities: extremities normal, atraumatic, no cyanosis or edema  Skin: Skin color, texture, turgor normal. No rashes or lesions  Neurologic: Grossly normal     Relevant labs:     CBC:  Lab Results  Component Value Date    WBC 13.28 (H) 07/17/2024    HGB 10.6 (L) 07/17/2024    HCT 32.5 (L) 07/17/2024    PLT 236 07/17/2024     LFT:  Lab Results   Component Value Date    TOTPRO 5.3 (L) 07/17/2024    ALBUMIN 2.4 (L) 07/17/2024    BILITOT 0.4 07/17/2024    ALKPHOS 130 (H) 07/17/2024    AST 18 07/17/2024    ALT 15 07/17/2024       BMP:  Lab Results   Component Value Date    NA 145 07/17/2024    K 3.3 (L) 07/17/2024    CL 113 (H) 07/17/2024    CO2 28 07/17/2024    BUN 21 (H) 07/17/2024    CREAT 1.20 07/17/2024    CALCIUM  8.2 (L) 07/17/2024    GLUCOSE 98 07/17/2024       Cal/iCal/MG/Ph:  Lab Results   Component Value Date    CALCIUM  8.2 (L) 07/17/2024    MG 1.6 07/17/2024    PHOS 1.6 (L) 07/17/2024     Coags:  No results found for: ''PT'', ''APTT'', ''INR''                Relevant Imaging Studies (most recent results only):       XR chest ap portable (1 view)  Result Date: 07/12/2024  XR CHEST AP 1V PORTABLE  CLINICAL HISTORY: sepsis, eval for aspiration / pneumonia. COMPARISON: 04/24/2019.     IMPRESSION:  LINES AND TUBES: none MEDIASTINUM: Normal cardiomediastinal silhouette. LUNGS: Normal lung volumes. No consolidation. Calcified granuloma again identified in the right lateral lung base PLEURA: No pleural effusions. No pneumothorax. OSSEOUS AND SOFT TISSUES: No acute osseous abnormalities. Diffuse osteopenia with degenerative changes of the thoracic spine. Multiple left axillary surgical clips Signed by: Juliene Harlem   07/12/2024 4:38 PM         Procedures & Operations Performed:     IV antibiotics  Foley catheter exchange       Nutrition Recommendations & Malnutrition Assessment   I have seen and examined the patient and agree with the RD assessment and plan. See RD note from this hospital admission for additional details.     Patient meets criteria for Patient does not meet criteria for Malnutrition at this time    (current weight 53.1 kg (117 lb 1 oz), BMI 17.8; IBW 63 kg/140 lbs, IBW %: 84)     Continue with puree diet  Boost VHC chocolate BID  Encourage po intake    Author: Armando Isles, MS, RD      07/14/2024 1:25 PM       Discharge Diet Orders: There are no active discharge diet orders for this encounter.   Skin/Wound  Braden Scale (+) Moderate Risk for Pressure Injury      Discharge Activity Orders   There are no active discharge activity orders for this encounter.   Rehab Assessment   I have seen and examined the patient and agree with the PT assessment detailed below:  Inpatient Recommendation: PT treatment (07/12/24 9081)  Recommendation  Discharge Recommendation: Physical Therapy;Would benefit from continued therapy (07/13/24 1554)  Discharge concerns: Requires assistance for self care;Requires assistance for mobility (07/13/24 1554)  Discharge Equipment Recommended: Owns recommended DME (07/13/24 1554)  Comments: pending improvement in ability to participate with therapy to ascertain appropriate discharge rec (07/13/24 1554)  Present During Evaluation: Other reduced mobility (Z74.09)      No OT evaluation this encounter  Case Manager/Home Health Assessment   Discharge Information  Discharge Address: [id number] [facility] [place] [address] (07/16/24 1336)              Home Health Orders  There are no active discharge home health orders for this encounter.   Goals of Care Note (if new for this encounter)  No notes of this type exist for this encounter.        I have seen and examined the patient on their discharge day. I have reviewed, edited, and agree with the above discharge summary. More than 30 minutes was personally spent on discharge planning on the day of discharge in coordination of care, counseling the patient and preparation of discharge.  Camellia BIRCH. Rojelio, MD  07/17/2024 9:46 AM

## 2024-07-18 ENCOUNTER — Telehealth: Payer: TRICARE (CHAMPUS)

## 2024-07-18 NOTE — Telephone Encounter
 Faxed to [facility] to [phone]. Thank you

## 2024-08-07 ENCOUNTER — Ambulatory Visit: Payer: MEDICARE | Attending: Surgical Oncology

## 2024-09-02 ENCOUNTER — Ambulatory Visit: Payer: MEDICARE | Attending: Student in an Organized Health Care Education/Training Program

## 2024-09-04 ENCOUNTER — Ambulatory Visit: Payer: TRICARE (CHAMPUS) | Attending: Gastroenterology

## 2024-09-21 DEATH — deceased
